# Patient Record
Sex: Female | Born: 1935
Health system: Southern US, Community
[De-identification: ages and names within clinical notes are randomized; demographics above are authoritative.]

## PROBLEM LIST (undated history)

## (undated) DIAGNOSIS — M1990 Unspecified osteoarthritis, unspecified site: Secondary | ICD-10-CM

## (undated) DIAGNOSIS — I10 Essential (primary) hypertension: Secondary | ICD-10-CM

## (undated) DIAGNOSIS — K631 Perforation of intestine (nontraumatic): Secondary | ICD-10-CM

## (undated) DIAGNOSIS — G894 Chronic pain syndrome: Secondary | ICD-10-CM

## (undated) DIAGNOSIS — I471 Supraventricular tachycardia, unspecified: Secondary | ICD-10-CM

## (undated) DIAGNOSIS — C801 Malignant (primary) neoplasm, unspecified: Secondary | ICD-10-CM

## (undated) DIAGNOSIS — N189 Chronic kidney disease, unspecified: Secondary | ICD-10-CM

## (undated) DIAGNOSIS — I6529 Occlusion and stenosis of unspecified carotid artery: Secondary | ICD-10-CM

## (undated) DIAGNOSIS — E785 Hyperlipidemia, unspecified: Secondary | ICD-10-CM

## (undated) DIAGNOSIS — F0390 Unspecified dementia without behavioral disturbance: Secondary | ICD-10-CM

## (undated) HISTORY — PX: ABDOMINAL HYSTERECTOMY: SHX81

## (undated) HISTORY — DX: Occlusion and stenosis of unspecified carotid artery: I65.29

## (undated) HISTORY — DX: Malignant (primary) neoplasm, unspecified: C80.1

## (undated) HISTORY — DX: Hyperlipidemia, unspecified: E78.5

## (undated) HISTORY — PX: COLON SURGERY: SHX602

## (undated) HISTORY — DX: Essential (primary) hypertension: I10

## (undated) HISTORY — DX: Perforation of intestine (nontraumatic): K63.1

## (undated) HISTORY — PX: COLOSTOMY: SHX63

---

## 2008-03-16 ENCOUNTER — Encounter: Admission: RE | Admit: 2008-03-16 | Discharge: 2008-03-16 | Payer: Self-pay

## 2008-03-25 ENCOUNTER — Ambulatory Visit: Payer: Self-pay | Admitting: Vascular Surgery

## 2008-04-05 ENCOUNTER — Ambulatory Visit: Payer: Self-pay | Admitting: Vascular Surgery

## 2008-04-05 ENCOUNTER — Encounter: Payer: Self-pay | Admitting: Vascular Surgery

## 2008-04-05 ENCOUNTER — Inpatient Hospital Stay (HOSPITAL_COMMUNITY): Admission: RE | Admit: 2008-04-05 | Discharge: 2008-04-06 | Payer: Self-pay | Admitting: Vascular Surgery

## 2008-04-05 HISTORY — PX: CAROTID ENDARTERECTOMY: SUR193

## 2008-04-22 ENCOUNTER — Ambulatory Visit: Payer: Self-pay | Admitting: Vascular Surgery

## 2008-10-13 IMAGING — CR DG CHEST 1V
1 series · 1 of 1 positions shown · non-contrast
Comparison: 03/30/2008

CLINICAL DATA: Preop for vascular surgery.  Routine preop chest x-
ray showed possible right lower lobe nodule versus nipple shadow

CHEST - 1 VIEW

[view not recorded]
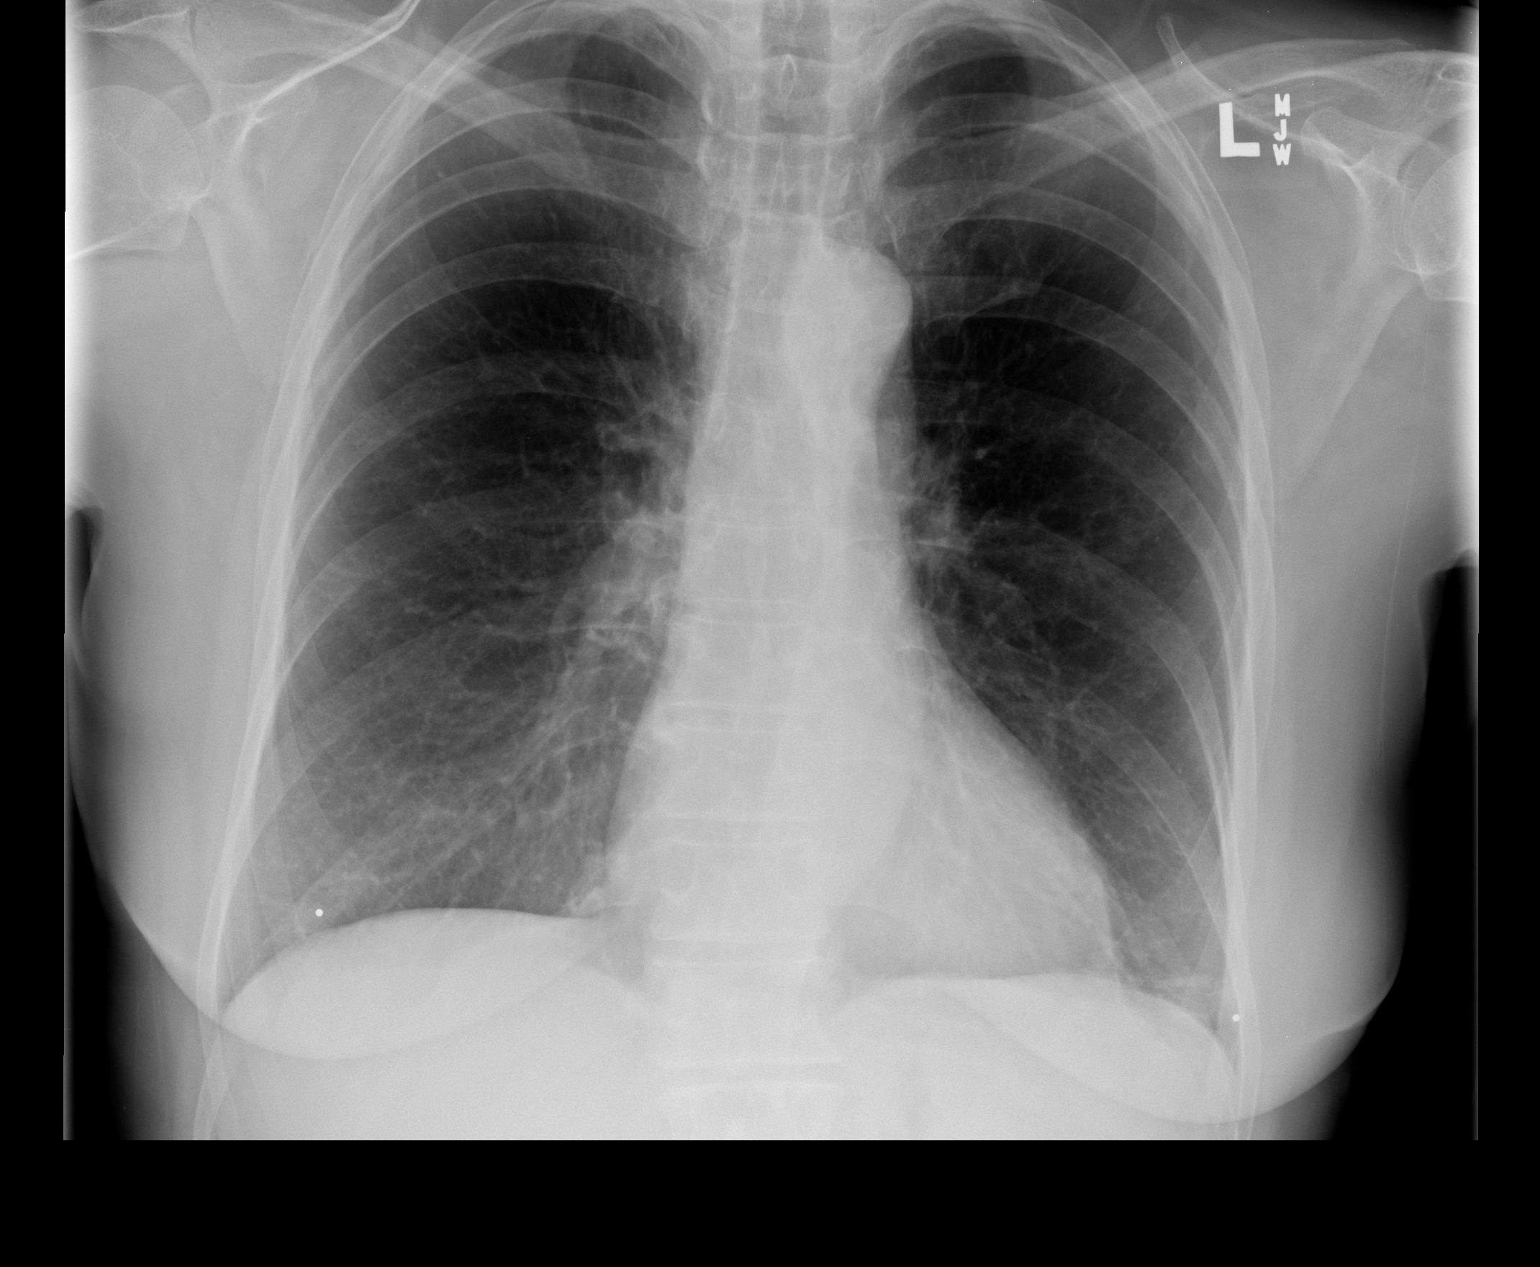

[1 of 1 positions shown; findings below may reference images not displayed]

FINDINGS: The right nipple accounts for the density at the right
lung base.  There is no acute process.  Lungs clear.  Heart and
vascularity normal.
IMPRESSION: No active disease - the right nipple accounts for the shadow on the
preoperative chest x-ray dated 03/30/2008.

## 2008-10-28 ENCOUNTER — Ambulatory Visit: Payer: Self-pay | Admitting: Vascular Surgery

## 2009-06-02 ENCOUNTER — Ambulatory Visit: Payer: Self-pay | Admitting: Vascular Surgery

## 2010-02-20 ENCOUNTER — Encounter: Admission: RE | Admit: 2010-02-20 | Discharge: 2010-02-20 | Payer: Self-pay | Admitting: Family Medicine

## 2010-12-09 DIAGNOSIS — K631 Perforation of intestine (nontraumatic): Secondary | ICD-10-CM

## 2010-12-09 HISTORY — DX: Perforation of intestine (nontraumatic): K63.1

## 2010-12-11 ENCOUNTER — Encounter
Admission: RE | Admit: 2010-12-11 | Discharge: 2010-12-11 | Payer: Self-pay | Source: Home / Self Care | Attending: Family Medicine | Admitting: Family Medicine

## 2010-12-11 ENCOUNTER — Inpatient Hospital Stay (HOSPITAL_COMMUNITY)
Admission: EM | Admit: 2010-12-11 | Discharge: 2010-12-21 | Payer: Self-pay | Source: Home / Self Care | Attending: Internal Medicine | Admitting: Internal Medicine

## 2010-12-12 LAB — COMPREHENSIVE METABOLIC PANEL
ALT: 12 U/L (ref 0–35)
AST: 13 U/L (ref 0–37)
Albumin: 1.7 g/dL — ABNORMAL LOW (ref 3.5–5.2)
Alkaline Phosphatase: 73 U/L (ref 39–117)
BUN: 18 mg/dL (ref 6–23)
CO2: 24 mEq/L (ref 19–32)
Calcium: 7.7 mg/dL — ABNORMAL LOW (ref 8.4–10.5)
Chloride: 103 mEq/L (ref 96–112)
Creatinine, Ser: 0.88 mg/dL (ref 0.4–1.2)
GFR calc Af Amer: >60 mL/min (ref >60)
GFR calc non Af Amer: >60 mL/min (ref >60)
Glucose, Bld: 95 mg/dL (ref 70–99)
Potassium: 3.8 mEq/L (ref 3.5–5.1)
Sodium: 135 mEq/L (ref 135–145)
Total Bilirubin: 0.5 mg/dL (ref 0.3–1.2)
Total Protein: 5.6 g/dL — ABNORMAL LOW (ref 6.0–8.3)

## 2010-12-12 LAB — PROTIME-INR
INR: 1.38 (ref 0.00–1.49)
Prothrombin Time: 17.2 seconds — ABNORMAL HIGH (ref 11.6–15.2)

## 2010-12-12 LAB — CBC
HCT: 27.7 % — ABNORMAL LOW (ref 36.0–46.0)
Hemoglobin: 8.8 g/dL — ABNORMAL LOW (ref 12.0–15.0)
MCH: 26.9 pg (ref 26.0–34.0)
MCHC: 31.8 g/dL (ref 30.0–36.0)
MCV: 84.7 fL (ref 78.0–100.0)
Platelets: 467 10*3/uL — ABNORMAL HIGH (ref 150–400)
RBC: 3.27 MIL/uL — ABNORMAL LOW (ref 3.87–5.11)
RDW: 14.4 % (ref 11.5–15.5)
WBC: 19.5 10*3/uL — ABNORMAL HIGH (ref 4.0–10.5)

## 2010-12-12 LAB — MAGNESIUM: Magnesium: 2.2 mg/dL (ref 1.5–2.5)

## 2010-12-12 LAB — APTT: aPTT: 54 seconds — ABNORMAL HIGH (ref 24–37)

## 2010-12-13 LAB — BASIC METABOLIC PANEL
BUN: 10 mg/dL (ref 6–23)
CO2: 25 mEq/L (ref 19–32)
Calcium: 7.6 mg/dL — ABNORMAL LOW (ref 8.4–10.5)
Chloride: 106 mEq/L (ref 96–112)
Creatinine, Ser: 0.8 mg/dL (ref 0.4–1.2)
GFR calc Af Amer: >60 mL/min (ref >60)
GFR calc non Af Amer: >60 mL/min (ref >60)
Glucose, Bld: 98 mg/dL (ref 70–99)
Potassium: 3.6 mEq/L (ref 3.5–5.1)
Sodium: 138 mEq/L (ref 135–145)

## 2010-12-13 LAB — CBC
HCT: 27.4 % — ABNORMAL LOW (ref 36.0–46.0)
Hemoglobin: 8.5 g/dL — ABNORMAL LOW (ref 12.0–15.0)
MCH: 26.4 pg (ref 26.0–34.0)
MCHC: 31 g/dL (ref 30.0–36.0)
MCV: 85.1 fL (ref 78.0–100.0)
Platelets: 480 10*3/uL — ABNORMAL HIGH (ref 150–400)
RBC: 3.22 MIL/uL — ABNORMAL LOW (ref 3.87–5.11)
RDW: 14.6 % (ref 11.5–15.5)
WBC: 13.8 10*3/uL — ABNORMAL HIGH (ref 4.0–10.5)

## 2010-12-13 LAB — HEMOCCULT GUIAC POC 1CARD (OFFICE): Fecal Occult Bld: POSITIVE

## 2010-12-14 ENCOUNTER — Encounter (INDEPENDENT_AMBULATORY_CARE_PROVIDER_SITE_OTHER): Payer: Self-pay | Admitting: Internal Medicine

## 2010-12-14 LAB — CBC
HCT: 28.2 % — ABNORMAL LOW (ref 36.0–46.0)
Hemoglobin: 8.7 g/dL — ABNORMAL LOW (ref 12.0–15.0)
MCH: 26 pg (ref 26.0–34.0)
MCHC: 30.9 g/dL (ref 30.0–36.0)
MCV: 84.4 fL (ref 78.0–100.0)
Platelets: 461 10*3/uL — ABNORMAL HIGH (ref 150–400)
RBC: 3.34 MIL/uL — ABNORMAL LOW (ref 3.87–5.11)
RDW: 14.5 % (ref 11.5–15.5)
WBC: 10.6 10*3/uL — ABNORMAL HIGH (ref 4.0–10.5)

## 2010-12-14 LAB — PREALBUMIN: Prealbumin: 4.2 mg/dL — ABNORMAL LOW (ref 17.0–34.0)

## 2010-12-14 LAB — CEA: CEA: 2.3 ng/mL (ref 0.0–5.0)

## 2010-12-24 LAB — BASIC METABOLIC PANEL
BUN: 3 mg/dL — ABNORMAL LOW (ref 6–23)
BUN: 2 mg/dL — ABNORMAL LOW (ref 6–23)
BUN: 2 mg/dL — ABNORMAL LOW (ref 6–23)
BUN: 1 mg/dL — ABNORMAL LOW (ref 6–23)
BUN: 4 mg/dL — ABNORMAL LOW (ref 6–23)
BUN: 6 mg/dL (ref 6–23)
CO2: 29 mEq/L (ref 19–32)
CO2: 26 mEq/L (ref 19–32)
CO2: 26 mEq/L (ref 19–32)
CO2: 29 mEq/L (ref 19–32)
CO2: 28 mEq/L (ref 19–32)
CO2: 28 mEq/L (ref 19–32)
Calcium: 7.6 mg/dL — ABNORMAL LOW (ref 8.4–10.5)
Calcium: 7.7 mg/dL — ABNORMAL LOW (ref 8.4–10.5)
Calcium: 7.5 mg/dL — ABNORMAL LOW (ref 8.4–10.5)
Calcium: 7.8 mg/dL — ABNORMAL LOW (ref 8.4–10.5)
Calcium: 8 mg/dL — ABNORMAL LOW (ref 8.4–10.5)
Calcium: 7.6 mg/dL — ABNORMAL LOW (ref 8.4–10.5)
Chloride: 101 mEq/L (ref 96–112)
Chloride: 106 mEq/L (ref 96–112)
Chloride: 109 mEq/L (ref 96–112)
Chloride: 107 mEq/L (ref 96–112)
Chloride: 103 mEq/L (ref 96–112)
Chloride: 103 mEq/L (ref 96–112)
Creatinine, Ser: 0.62 mg/dL (ref 0.4–1.2)
Creatinine, Ser: 0.73 mg/dL (ref 0.4–1.2)
Creatinine, Ser: 0.63 mg/dL (ref 0.4–1.2)
Creatinine, Ser: 0.63 mg/dL (ref 0.4–1.2)
Creatinine, Ser: 0.69 mg/dL (ref 0.4–1.2)
Creatinine, Ser: 0.63 mg/dL (ref 0.4–1.2)
GFR calc Af Amer: >60 mL/min (ref >60)
GFR calc Af Amer: >60 mL/min (ref >60)
GFR calc Af Amer: >60 mL/min (ref >60)
GFR calc Af Amer: >60 mL/min (ref >60)
GFR calc Af Amer: >60 mL/min (ref >60)
GFR calc Af Amer: >60 mL/min (ref >60)
GFR calc non Af Amer: >60 mL/min (ref >60)
GFR calc non Af Amer: >60 mL/min (ref >60)
GFR calc non Af Amer: >60 mL/min (ref >60)
GFR calc non Af Amer: >60 mL/min (ref >60)
GFR calc non Af Amer: >60 mL/min (ref >60)
GFR calc non Af Amer: >60 mL/min (ref >60)
Glucose, Bld: 149 mg/dL — ABNORMAL HIGH (ref 70–99)
Glucose, Bld: 136 mg/dL — ABNORMAL HIGH (ref 70–99)
Glucose, Bld: 144 mg/dL — ABNORMAL HIGH (ref 70–99)
Glucose, Bld: 106 mg/dL — ABNORMAL HIGH (ref 70–99)
Glucose, Bld: 108 mg/dL — ABNORMAL HIGH (ref 70–99)
Glucose, Bld: 106 mg/dL — ABNORMAL HIGH (ref 70–99)
Potassium: 2.8 mEq/L — ABNORMAL LOW (ref 3.5–5.1)
Potassium: 3.5 mEq/L (ref 3.5–5.1)
Potassium: 3.8 mEq/L (ref 3.5–5.1)
Potassium: 3.1 mEq/L — ABNORMAL LOW (ref 3.5–5.1)
Potassium: 3.6 mEq/L (ref 3.5–5.1)
Potassium: 3.4 mEq/L — ABNORMAL LOW (ref 3.5–5.1)
Sodium: 138 mEq/L (ref 135–145)
Sodium: 139 mEq/L (ref 135–145)
Sodium: 140 mEq/L (ref 135–145)
Sodium: 142 mEq/L (ref 135–145)
Sodium: 140 mEq/L (ref 135–145)
Sodium: 137 mEq/L (ref 135–145)

## 2010-12-24 LAB — CBC
HCT: 29.3 % — ABNORMAL LOW (ref 36.0–46.0)
HCT: 27.9 % — ABNORMAL LOW (ref 36.0–46.0)
HCT: 28.2 % — ABNORMAL LOW (ref 36.0–46.0)
HCT: 27.8 % — ABNORMAL LOW (ref 36.0–46.0)
Hemoglobin: 9.2 g/dL — ABNORMAL LOW (ref 12.0–15.0)
Hemoglobin: 8.6 g/dL — ABNORMAL LOW (ref 12.0–15.0)
Hemoglobin: 8.6 g/dL — ABNORMAL LOW (ref 12.0–15.0)
Hemoglobin: 8.5 g/dL — ABNORMAL LOW (ref 12.0–15.0)
MCH: 26.4 pg (ref 26.0–34.0)
MCH: 26.3 pg (ref 26.0–34.0)
MCH: 26.3 pg (ref 26.0–34.0)
MCH: 26.2 pg (ref 26.0–34.0)
MCHC: 31.4 g/dL (ref 30.0–36.0)
MCHC: 30.8 g/dL (ref 30.0–36.0)
MCHC: 30.5 g/dL (ref 30.0–36.0)
MCHC: 30.6 g/dL (ref 30.0–36.0)
MCV: 84.2 fL (ref 78.0–100.0)
MCV: 85.3 fL (ref 78.0–100.0)
MCV: 86.2 fL (ref 78.0–100.0)
MCV: 85.5 fL (ref 78.0–100.0)
Platelets: 466 10*3/uL — ABNORMAL HIGH (ref 150–400)
Platelets: 432 10*3/uL — ABNORMAL HIGH (ref 150–400)
Platelets: 438 10*3/uL — ABNORMAL HIGH (ref 150–400)
Platelets: 460 10*3/uL — ABNORMAL HIGH (ref 150–400)
RBC: 3.48 MIL/uL — ABNORMAL LOW (ref 3.87–5.11)
RBC: 3.27 MIL/uL — ABNORMAL LOW (ref 3.87–5.11)
RBC: 3.27 MIL/uL — ABNORMAL LOW (ref 3.87–5.11)
RBC: 3.25 MIL/uL — ABNORMAL LOW (ref 3.87–5.11)
RDW: 14.4 % (ref 11.5–15.5)
RDW: 14.8 % (ref 11.5–15.5)
RDW: 15 % (ref 11.5–15.5)
RDW: 15.4 % (ref 11.5–15.5)
WBC: 10.7 10*3/uL — ABNORMAL HIGH (ref 4.0–10.5)
WBC: 11.8 10*3/uL — ABNORMAL HIGH (ref 4.0–10.5)
WBC: 9.8 10*3/uL (ref 4.0–10.5)
WBC: 8.4 10*3/uL (ref 4.0–10.5)

## 2010-12-24 LAB — CULTURE, ROUTINE-ABSCESS

## 2010-12-24 LAB — ANAEROBIC CULTURE

## 2010-12-24 LAB — HEMOCCULT GUIAC POC 1CARD (OFFICE)
Fecal Occult Bld: POSITIVE
Fecal Occult Bld: POSITIVE

## 2010-12-24 LAB — PHOSPHORUS: Phosphorus: 3 mg/dL (ref 2.3–4.6)

## 2010-12-24 LAB — CLOSTRIDIUM DIFFICILE BY PCR
Toxigenic C. Difficile by PCR: NEGATIVE
Toxigenic C. Difficile by PCR: NEGATIVE

## 2010-12-24 LAB — CA 125: CA 125: 28.2 U/mL (ref 0.0–30.2)

## 2010-12-24 LAB — TYPE AND SCREEN
ABO/RH(D): O POS
Antibody Screen: NEGATIVE

## 2010-12-24 LAB — MAGNESIUM: Magnesium: 1.6 mg/dL (ref 1.5–2.5)

## 2010-12-24 LAB — ABO/RH: ABO/RH(D): O POS

## 2010-12-27 ENCOUNTER — Emergency Department (HOSPITAL_BASED_OUTPATIENT_CLINIC_OR_DEPARTMENT_OTHER)
Admission: EM | Admit: 2010-12-27 | Discharge: 2010-12-27 | Payer: Self-pay | Source: Home / Self Care | Admitting: Emergency Medicine

## 2010-12-30 ENCOUNTER — Encounter: Payer: Self-pay | Admitting: Family Medicine

## 2010-12-31 LAB — DIFFERENTIAL
Basophils Absolute: 0 10*3/uL (ref 0.0–0.1)
Basophils Relative: 0 % (ref 0–1)
Eosinophils Absolute: 0.2 10*3/uL (ref 0.0–0.7)
Eosinophils Relative: 2 % (ref 0–5)
Lymphocytes Relative: 12 % (ref 12–46)
Lymphs Abs: 0.9 10*3/uL (ref 0.7–4.0)
Monocytes Absolute: 0.7 10*3/uL (ref 0.1–1.0)
Monocytes Relative: 9 % (ref 3–12)
Neutro Abs: 5.6 10*3/uL (ref 1.7–7.7)
Neutrophils Relative %: 76 % (ref 43–77)

## 2010-12-31 LAB — URINE MICROSCOPIC-ADD ON

## 2010-12-31 LAB — URINE CULTURE
Colony Count: NO GROWTH
Culture  Setup Time: 201201200140
Culture: NO GROWTH

## 2010-12-31 LAB — URINALYSIS, ROUTINE W REFLEX MICROSCOPIC
Bilirubin Urine: NEGATIVE
Ketones, ur: NEGATIVE mg/dL
Nitrite: NEGATIVE
Protein, ur: NEGATIVE mg/dL
Specific Gravity, Urine: 1.01 (ref 1.005–1.030)
Urine Glucose, Fasting: NEGATIVE mg/dL
Urobilinogen, UA: 0.2 mg/dL (ref 0.0–1.0)
pH: 6 (ref 5.0–8.0)

## 2010-12-31 LAB — CBC
HCT: 30.7 % — ABNORMAL LOW (ref 36.0–46.0)
Hemoglobin: 9.6 g/dL — ABNORMAL LOW (ref 12.0–15.0)
MCH: 26.2 pg (ref 26.0–34.0)
MCHC: 31.3 g/dL (ref 30.0–36.0)
MCV: 83.7 fL (ref 78.0–100.0)
Platelets: 425 10*3/uL — ABNORMAL HIGH (ref 150–400)
RBC: 3.67 MIL/uL — ABNORMAL LOW (ref 3.87–5.11)
RDW: 16.2 % — ABNORMAL HIGH (ref 11.5–15.5)
WBC: 7.3 10*3/uL (ref 4.0–10.5)

## 2010-12-31 LAB — COMPREHENSIVE METABOLIC PANEL
ALT: 20 U/L (ref 0–35)
AST: 19 U/L (ref 0–37)
Albumin: 3.1 g/dL — ABNORMAL LOW (ref 3.5–5.2)
Alkaline Phosphatase: 94 U/L (ref 39–117)
BUN: 12 mg/dL (ref 6–23)
CO2: 29 mEq/L (ref 19–32)
Calcium: 8.3 mg/dL — ABNORMAL LOW (ref 8.4–10.5)
Chloride: 106 mEq/L (ref 96–112)
Creatinine, Ser: 0.9 mg/dL (ref 0.4–1.2)
GFR calc Af Amer: >60 mL/min (ref >60)
GFR calc non Af Amer: >60 mL/min (ref >60)
Glucose, Bld: 107 mg/dL — ABNORMAL HIGH (ref 70–99)
Potassium: 3.7 mEq/L (ref 3.5–5.1)
Sodium: 143 mEq/L (ref 135–145)
Total Bilirubin: 0.4 mg/dL (ref 0.3–1.2)
Total Protein: 6.7 g/dL (ref 6.0–8.3)

## 2010-12-31 LAB — PROCALCITONIN: Procalcitonin: <0.1 ng/mL

## 2010-12-31 LAB — LACTIC ACID, PLASMA: Lactic Acid, Venous: 1 mmol/L (ref 0.5–2.2)

## 2011-01-02 LAB — CULTURE, BLOOD (ROUTINE X 2)
Culture  Setup Time: 201201192318
Culture: NO GROWTH

## 2011-01-03 LAB — CULTURE, BLOOD (ROUTINE X 2)
Culture  Setup Time: 201201200151
Culture: NO GROWTH

## 2011-02-18 LAB — URINALYSIS, ROUTINE W REFLEX MICROSCOPIC
Bilirubin Urine: NEGATIVE
Glucose, UA: NEGATIVE mg/dL
Ketones, ur: NEGATIVE mg/dL
Nitrite: POSITIVE — AB
Protein, ur: NEGATIVE mg/dL
Specific Gravity, Urine: >1.046 — ABNORMAL HIGH (ref 1.005–1.030)
Urobilinogen, UA: 0.2 mg/dL (ref 0.0–1.0)
pH: 6 (ref 5.0–8.0)

## 2011-02-18 LAB — CBC
HCT: 37.5 % (ref 36.0–46.0)
Hemoglobin: 12.1 g/dL (ref 12.0–15.0)
MCH: 27.3 pg (ref 26.0–34.0)
MCHC: 32.3 g/dL (ref 30.0–36.0)
MCV: 84.7 fL (ref 78.0–100.0)
Platelets: 427 10*3/uL — ABNORMAL HIGH (ref 150–400)
RBC: 4.43 MIL/uL (ref 3.87–5.11)
RDW: 14.7 % (ref 11.5–15.5)
WBC: 24.4 10*3/uL — ABNORMAL HIGH (ref 4.0–10.5)

## 2011-02-18 LAB — BASIC METABOLIC PANEL
BUN: 25 mg/dL — ABNORMAL HIGH (ref 6–23)
CO2: 26 mEq/L (ref 19–32)
Calcium: 8.3 mg/dL — ABNORMAL LOW (ref 8.4–10.5)
Chloride: 98 mEq/L (ref 96–112)
Creatinine, Ser: 1 mg/dL (ref 0.4–1.2)
GFR calc Af Amer: >60 mL/min (ref >60)
GFR calc non Af Amer: 54 mL/min — ABNORMAL LOW (ref >60)
Glucose, Bld: 96 mg/dL (ref 70–99)
Potassium: 5.1 mEq/L (ref 3.5–5.1)
Sodium: 134 mEq/L — ABNORMAL LOW (ref 135–145)

## 2011-02-18 LAB — URINE CULTURE
Colony Count: >=100000
Culture  Setup Time: 201201040215

## 2011-02-18 LAB — APTT: aPTT: 49 seconds — ABNORMAL HIGH (ref 24–37)

## 2011-02-18 LAB — DIFFERENTIAL
Basophils Absolute: 0 10*3/uL (ref 0.0–0.1)
Basophils Relative: 0 % (ref 0–1)
Eosinophils Absolute: 0 10*3/uL (ref 0.0–0.7)
Eosinophils Relative: 0 % (ref 0–5)
Lymphocytes Relative: 6 % — ABNORMAL LOW (ref 12–46)
Lymphs Abs: 1.4 10*3/uL (ref 0.7–4.0)
Monocytes Absolute: 0.6 10*3/uL (ref 0.1–1.0)
Monocytes Relative: 3 % (ref 3–12)
Neutro Abs: 22.3 10*3/uL — ABNORMAL HIGH (ref 1.7–7.7)
Neutrophils Relative %: 92 % — ABNORMAL HIGH (ref 43–77)

## 2011-02-18 LAB — PROTIME-INR
INR: 1.35 (ref 0.00–1.49)
Prothrombin Time: 16.9 seconds — ABNORMAL HIGH (ref 11.6–15.2)

## 2011-02-18 LAB — URINE MICROSCOPIC-ADD ON

## 2011-03-07 ENCOUNTER — Other Ambulatory Visit: Payer: Self-pay | Admitting: Gastroenterology

## 2011-03-13 ENCOUNTER — Other Ambulatory Visit: Payer: Self-pay | Admitting: General Surgery

## 2011-03-13 DIAGNOSIS — C189 Malignant neoplasm of colon, unspecified: Secondary | ICD-10-CM

## 2011-03-18 ENCOUNTER — Ambulatory Visit
Admission: RE | Admit: 2011-03-18 | Discharge: 2011-03-18 | Disposition: A | Discharge status: Home / Self Care | Payer: Medicare Other | Source: Ambulatory Visit | Attending: General Surgery | Admitting: General Surgery

## 2011-03-18 DIAGNOSIS — C189 Malignant neoplasm of colon, unspecified: Secondary | ICD-10-CM

## 2011-03-18 MED ORDER — IOHEXOL 300 MG/ML  SOLN
100.0000 mL | Freq: Once | INTRAMUSCULAR | Status: AC | PRN
  Administered 2011-03-18: 100 mL via INTRAVENOUS

## 2011-03-28 ENCOUNTER — Other Ambulatory Visit: Payer: Self-pay | Admitting: Surgery

## 2011-03-28 ENCOUNTER — Encounter (HOSPITAL_COMMUNITY): Payer: Medicare Other | Attending: Surgery

## 2011-03-28 DIAGNOSIS — C182 Malignant neoplasm of ascending colon: Secondary | ICD-10-CM | POA: Insufficient documentation

## 2011-03-28 DIAGNOSIS — Z01812 Encounter for preprocedural laboratory examination: Secondary | ICD-10-CM | POA: Insufficient documentation

## 2011-03-28 DIAGNOSIS — I10 Essential (primary) hypertension: Secondary | ICD-10-CM | POA: Insufficient documentation

## 2011-03-28 DIAGNOSIS — E7800 Pure hypercholesterolemia, unspecified: Secondary | ICD-10-CM | POA: Insufficient documentation

## 2011-03-28 LAB — SURGICAL PCR SCREEN
MRSA, PCR: NEGATIVE
Staphylococcus aureus: NEGATIVE

## 2011-03-28 LAB — CBC
MCH: 25.5 pg — ABNORMAL LOW (ref 26.0–34.0)
Platelets: 317 10*3/uL (ref 150–400)
RBC: 4.04 MIL/uL (ref 3.87–5.11)
WBC: 9 10*3/uL (ref 4.0–10.5)

## 2011-03-28 LAB — BASIC METABOLIC PANEL
Chloride: 104 mEq/L (ref 96–112)
Creatinine, Ser: 0.72 mg/dL (ref 0.4–1.2)
GFR calc Af Amer: >60 mL/min (ref >60)

## 2011-04-02 ENCOUNTER — Inpatient Hospital Stay (HOSPITAL_COMMUNITY)
Admission: RE | Admit: 2011-04-02 | Discharge: 2011-04-10 | DRG: 330 | Disposition: A | Discharge status: Home / Self Care | Payer: Medicare Other | Source: Ambulatory Visit | Attending: Surgery | Admitting: Surgery

## 2011-04-02 ENCOUNTER — Other Ambulatory Visit: Payer: Self-pay | Admitting: Urology

## 2011-04-02 ENCOUNTER — Inpatient Hospital Stay (HOSPITAL_COMMUNITY): Payer: Medicare Other

## 2011-04-02 DIAGNOSIS — I10 Essential (primary) hypertension: Secondary | ICD-10-CM | POA: Diagnosis present

## 2011-04-02 DIAGNOSIS — K7689 Other specified diseases of liver: Secondary | ICD-10-CM | POA: Diagnosis present

## 2011-04-02 DIAGNOSIS — E7800 Pure hypercholesterolemia, unspecified: Secondary | ICD-10-CM | POA: Diagnosis present

## 2011-04-02 DIAGNOSIS — C50919 Malignant neoplasm of unspecified site of unspecified female breast: Secondary | ICD-10-CM | POA: Diagnosis present

## 2011-04-02 DIAGNOSIS — C187 Malignant neoplasm of sigmoid colon: Principal | ICD-10-CM | POA: Diagnosis present

## 2011-04-02 DIAGNOSIS — C779 Secondary and unspecified malignant neoplasm of lymph node, unspecified: Secondary | ICD-10-CM | POA: Diagnosis present

## 2011-04-02 DIAGNOSIS — N9489 Other specified conditions associated with female genital organs and menstrual cycle: Secondary | ICD-10-CM | POA: Diagnosis present

## 2011-04-02 DIAGNOSIS — K660 Peritoneal adhesions (postprocedural) (postinfection): Secondary | ICD-10-CM | POA: Diagnosis present

## 2011-04-02 DIAGNOSIS — N321 Vesicointestinal fistula: Secondary | ICD-10-CM | POA: Diagnosis present

## 2011-04-02 DIAGNOSIS — N3289 Other specified disorders of bladder: Secondary | ICD-10-CM | POA: Diagnosis present

## 2011-04-02 DIAGNOSIS — K560 Paralytic ileus: Secondary | ICD-10-CM | POA: Diagnosis not present

## 2011-04-02 LAB — PREPARE RBC (CROSSMATCH)

## 2011-04-02 LAB — CBC
MCHC: 31 g/dL (ref 30.0–36.0)
Platelets: 286 10*3/uL (ref 150–400)
RDW: 13.7 % (ref 11.5–15.5)
WBC: 16.4 10*3/uL — ABNORMAL HIGH (ref 4.0–10.5)

## 2011-04-02 LAB — BASIC METABOLIC PANEL
Calcium: 7.6 mg/dL — ABNORMAL LOW (ref 8.4–10.5)
GFR calc Af Amer: >60 mL/min (ref >60)
GFR calc non Af Amer: >60 mL/min (ref >60)
Glucose, Bld: 126 mg/dL — ABNORMAL HIGH (ref 70–99)
Potassium: 3.8 mEq/L (ref 3.5–5.1)
Sodium: 136 mEq/L (ref 135–145)

## 2011-04-03 LAB — CBC
HCT: 27.9 % — ABNORMAL LOW (ref 36.0–46.0)
Hemoglobin: 8.9 g/dL — ABNORMAL LOW (ref 12.0–15.0)
MCH: 25.9 pg — ABNORMAL LOW (ref 26.0–34.0)
MCHC: 31.9 g/dL (ref 30.0–36.0)
MCV: 81.3 fL (ref 78.0–100.0)
Platelets: 264 K/uL (ref 150–400)
RBC: 3.43 MIL/uL — ABNORMAL LOW (ref 3.87–5.11)
RDW: 13.6 % (ref 11.5–15.5)
WBC: 14.5 K/uL — ABNORMAL HIGH (ref 4.0–10.5)

## 2011-04-03 LAB — DIFFERENTIAL
Basophils Absolute: 0 10*3/uL (ref 0.0–0.1)
Eosinophils Relative: 0 % (ref 0–5)
Lymphocytes Relative: 4 % — ABNORMAL LOW (ref 12–46)
Neutro Abs: 13.1 10*3/uL — ABNORMAL HIGH (ref 1.7–7.7)
Neutrophils Relative %: 90 % — ABNORMAL HIGH (ref 43–77)

## 2011-04-03 LAB — BASIC METABOLIC PANEL
BUN: 19 mg/dL (ref 6–23)
Creatinine, Ser: 1.23 mg/dL — ABNORMAL HIGH (ref 0.4–1.2)
GFR calc non Af Amer: 40 mL/min — ABNORMAL LOW (ref >60)
Glucose, Bld: 177 mg/dL — ABNORMAL HIGH (ref 70–99)
Potassium: 4 mEq/L (ref 3.5–5.1)

## 2011-04-04 LAB — DIFFERENTIAL
Basophils Absolute: 0 10*3/uL (ref 0.0–0.1)
Eosinophils Absolute: 0 10*3/uL (ref 0.0–0.7)
Eosinophils Relative: 0 % (ref 0–5)
Lymphocytes Relative: 13 % (ref 12–46)
Monocytes Absolute: 1 10*3/uL (ref 0.1–1.0)

## 2011-04-04 LAB — CBC
HCT: 27.9 % — ABNORMAL LOW (ref 36.0–46.0)
MCHC: 30.8 g/dL (ref 30.0–36.0)
MCV: 82.1 fL (ref 78.0–100.0)
RDW: 14 % (ref 11.5–15.5)

## 2011-04-04 LAB — BASIC METABOLIC PANEL
BUN: 13 mg/dL (ref 6–23)
Calcium: 7.7 mg/dL — ABNORMAL LOW (ref 8.4–10.5)
GFR calc non Af Amer: 54 mL/min — ABNORMAL LOW (ref >60)
Glucose, Bld: 120 mg/dL — ABNORMAL HIGH (ref 70–99)

## 2011-04-04 NOTE — Op Note (Signed)
  NAMERADLEY, TESTON           ACCOUNT NO.:  [id number]  MEDICAL RECORD NO.:  [id number]           PATIENT TYPE:  I  LOCATION:  0001                         FACILITY:  [facility]  PHYSICIAN:  Courtney Paris, M.D.DATE OF BIRTH:  1932-07-07  DATE OF PROCEDURE:  04/02/2011 DATE OF DISCHARGE:                              OPERATIVE REPORT   PREOPERATIVE DIAGNOSIS:  Colon cancer.  POSTOPERATIVE DIAGNOSES: 1. Colon cancer. 2. Inflammatory lesion in midline posterior bladder base.  PROCEDURE:  Cystoscopy, bilateral retrogrades and placement of ureteral stents and biopsy of bladder lesion posterior midline with fulguration (1.5 cm).  SURGEON:  Courtney Paris, M.D.  ANESTHESIA:  General.  BRIEF HISTORY:  This 77 year old lady had a recent pelvic abscess, but was found to have a colon cancer and was going in for partial colon resection and Dr. Ovidio Kin wanted bilateral ureteral stents because of the previous pelvic abscess to help identify the ureters.  DESCRIPTION OF PROCEDURE:  The patient was placed on the operating table in dorsal lithotomy position.  After satisfactory induction of general anesthesia, was prepped and draped with Betadine in the usual sterile fashion.  Given IV antibiotics.  Time-out was then performed and the patient and procedure were then reconfirmed.  The 21 panendoscope was inserted and the bladder inspected.  There was a fair amount of edema in the posterior midline base of the bladder and there was a lesion that was about 1.5 cm that looked like a small tumor with possibly some purulent fluid coming from this.  This was photographed and then biopsied with the cold cup biopsy forceps.  This was done x2.  The base was then fulgurated to effect good hemostasis.  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  It took quite a bit of looking to find the  ureteral orifice, first found the right using indigo carmine given by the anesthesia personnel.  I was able to pass a catheter up the right orifice and then also finally on the left.  These were tied and taped over a Goldberg ureteral adapter and attached to a Foley catheter.  These were taped securely in place and labeled clearly left and right.  The stents could be removed either both after the surgery or one or both could be left as necessary postoperatively.     Courtney Paris, M.D.     HMK/MEDQ  D:  04/02/2011  T:  04/02/2011  Job:  [id number]  cc:   Sandria Bales. Ezzard Standing, M.D. [address], [address] [place] [address]  Electronically Signed by Vic Blackbird M.D. on 04/04/2011 07:50:15 AM

## 2011-04-06 LAB — TYPE AND SCREEN: Unit division: 0

## 2011-04-07 LAB — BASIC METABOLIC PANEL
Calcium: 8 mg/dL — ABNORMAL LOW (ref 8.4–10.5)
GFR calc Af Amer: >60 mL/min (ref >60)
GFR calc non Af Amer: >60 mL/min (ref >60)
Sodium: 132 mEq/L — ABNORMAL LOW (ref 135–145)

## 2011-04-07 LAB — CBC
MCHC: 31.5 g/dL (ref 30.0–36.0)
Platelets: 303 10*3/uL (ref 150–400)
RDW: 13.5 % (ref 11.5–15.5)

## 2011-04-15 NOTE — Op Note (Signed)
[name], [name]           ACCOUNT NO.:  [id number]  MEDICAL RECORD NO.:  [id number]           PATIENT TYPE:  I  LOCATION:  1239                         FACILITY:  [facility]  PHYSICIAN:  Sandria Bales. Ezzard Standing, M.D.  DATE OF BIRTH:  Jul 23, 1932  DATE OF PROCEDURE: 02 April 2011                              OPERATIVE REPORT   PREOPERATIVE DIAGNOSIS:  Distal sigmoid colon carcinoma with history of sigmoid colon abscess.  POSTOPERATIVE DIAGNOSIS:  Distal sigmoid/proximal rectal colon carcinoma with inflammatory change of abscess of sigmoid colon, uterus, and posterior bladder.  PROCEDURES: 1. Cystoscopy with bilateral ureteral stents placed and biopsy of bladder by Dr. Vic Blackbird. 2. Rigid sigmoidoscopy, laparoscopic mobilization of splenic flexure,     sigmoid colectomy, uterine biopsy, repair of cystotomy, end colostomy with Hartmann pouch by Dr. Ovidio Kin. 3. Then hysterectomy with left salpingo-oophorectomy by Dr. Jaymes Graff.  FIRST ASSISTANTS:  Adolph Pollack, M.D. and Wilmon Arms. Tsuei, M.D.  ANESTHESIA:  General.  ESTIMATED BLOOD LOSS:  200 cc.  DRAINS:  Drains left in were none.  I did leave Telfa wicks in the wound.  INDICATIONS FOR PROCEDURE:  [name] is a 77 year old white female who is a patient of Dr. Mila Palmer as her primary medical doctor who was hospitalized in January for what was thought to be a diverticular abscess.  This was managed with a percutaneous drain on December 13, 2010; however, a fustulagram was done at the drain which showed this communicated with the distal small bowel.  Because of this, the drain was left in for 6 plus weeks and then removed.  She underwent a screening colonoscopy by Dr. Vida Rigger on March 07, 2011, and was found to have a tumor at the distal sigmoid colon which was near circumferential and biopsy showed a mildly differentiated adenocarcinoma.  It is unclear about how much of this perforation was  diverticular in nature, how much was this due to colon cancer, and she now comes for attempted colonic resection.  Because  the amount of inflammation, I have asked Dr. Londell Moh Kimbrough to put ureteral stents in her.  I have discussed with the patient the indications, potential complications of surgery.  Potential complications include, but are not limited to, bleeding, infection, ureteral injury, the possibility she would need a colostomy.  OPERATIVE NOTE:  First the patient underwent a bilateral ureteral stent placement with cystoscopy by Dr. Vic Blackbird.  He also found at the dome of the bladder posteriorly inflammatory changes, which looked like purulence coming from an early bladder fistula.  After he completed his procedure, [name] was placed in the lithotomy position.  She had a Foley catheter in place with bilateral ureteral stents.  I did a sigmoidoscopy on her.  I was able to advance the scope up to about 12 to 14 cm before I ran in the sigmoid colon cancer.  The abdomen was then prepped with ChloraPrep and sterilely draped.  A time-out was held and surgical checklist run.  She was given 1 g of cefoxitin at the initiation of procedure.  She was redosed during the case after 4  hours.  I started by making an infraumbilical incision with sharp dissection carried down to the abdominal cavity.  I placed a 12 mm Hasson trocar, secured this with 0 Vicryl suture.  I then placed 3 different 5 mm trocars, one in the right upper quadrant, one in right lower quadrant, and one in left lower quadrant.  I spent about an hour and a half doing several things laparoscopically.    First, she had a lot of adhesions of the small bowel to the anterior abdominal wall and some omental adhesions.  I took these down both with scissors and Harmonic scalpel.  Secondly, I mobilized the splenic flexure going to the mid-transverse colon, taking the splenic flexure down along the left  colon so that the colon to be rolled medially and mobilized enough to get it down for the dissection thoroughly, I got down to the pelvis.  Thirdly, I tried to mobilize the sigmoid colon off the lower anterior pelvic wall, but because of dense adhesions I was really unable to do so.  So after I completed the laparoscopic portion of the operation, I then did an open laparotomy using the wound protector.  I was able to resect and get down in the pelvis.  First, I inspected this prior injury to the small bowel.  There were some inflammatory changes. I took this small bowel down, but there was no obvious transmural injury to the small bowel.  I took down some other adhesions along the small bowel also.  I started to work on the inflammatory mass.  The mass was stuck anteriorly to what looked like the bladder and the uterus. When I fractured through the inflammatory mass, it looked like I had fractured into the uterus and getting the specimen out.  It looked like this was the center of the inflammatory mass between the sigmoid colon, which was distal sigmoid colon.  The uterus was in the middle and the bladder anteriorly.I did find purulence within this cavity, which makes this wound class 4 but I was not able obtain any cultures of this.  A question during the operation was how much of this is malignant, how much was it benign.  I did a biopsy of the uterus and sent this to [facility].  She called back and said the uterine biopsy showed inflammatory changes but no obvious malignant cells.  It appeared, however, that this whole inflammatory mass centered almost the sigmoid colon but it involved the uterus and the posterior wall of the bladder.  I had to do a hysterectomy on her as an en bloc resection as best I could such that may involve malignancy.  Dr. Jaymes Graff was contacted on phone and she was contacted to scrub in and perform the hysterectomy.  We found the left ovary and  fallopian tube, we really did not find anything on the right, should dictate this portion of the operation.  We never got to the cervix because of the amount of the inflammation, we tried to get the uterus off the bladder anteriorly, but we did get into the bladder making about a 2 cm hole in the bladder.  The uterus was then removed as was the left fallopian tube and ovary, could not find again any obvious right ovary.  I was able to palpate the ureteral stents during this entire time, but I was able to get down below the tumor, so this was sort of a low anterior resection getting below the peritoneal folds.  I got where I thought, it was at least 5 or 6 cm beyond where this tumor and inflammatory mass was.  I used a firing of the Contour stapler with blue load and fired this across the specimen.  I then tacked the corners of the cut to proximal rectum with 2-0 Prolene sutures.  I then took the specimen. Dr. Guerry Bruin was the pathologist on-call and reviewed the pathology with him and I showed him the colon specimen.  I had a long suture marking at the proximal end.    I showed Dr. Laureen Ochs the specimen of the uterus and showed him that there was a long suture on the right round ligament and showed what I thought was the left fallopian tube and possible ovary.  He cut the specimen in the path department and it did look like I had good margins on both ends and even called middle of the case and this may be more inflammatory changes than pure cancer which could be favorable for the patient.  The length of our surgery at this time was probably some 5 plus hours between the cystoscopy and my surgery.   I was concerned how she would be best served by doing a diverting colostomy and at this hour she did well with the surgery, consider bringing her back for reanastomosis at a later date.  I irrigated the abdomen with about 5 L of saline.  I did take her appendix out.  I took down the  mesentery of the appendix using Harmonic. I took the base of the appendix with 2-0 Vicryl suture and did a 2-0 silk suture to invert the stump of the appendix.  Small bowel again inspected.  There were some adhesions, which had been again taken down but the small bowel otherwise looked pretty good.  She did have adhesions over the dome of her liver, which invested her gallbladder, I left these alone.  Because I did not have any anastomosis, so I did not leave an NG tube in her.  I will put her in the intensive care unit overnight for observation. Sponge and needle count were correct at end of the case.  I brought out a colostomy in the left lower quadrant after cutting out a piece of skin and dividing the anterior and posterior rectus fascia.  I closed the midline incision with a 2-0 Vicryl suture for the peritoneum and #1 Novafil suture for the fascia.  I stapled the skin, placed Telfa wicks on the skin.  I matured the colostomy with 3-0 Vicryl sutures.  At the abdomen was closed, the colostomy matured, the sponge and needle count were correct and the patient tolerated the procedure well, was transferred to recovery room with plans to ICU tonight.   Sandria Bales. Ezzard Standing, M.D., FACS   DHN/MEDQ  D:  04/02/2011  T:  04/03/2011  Job:  [id number]  cc:   Emeterio Reeve, MD Fax: [phone]  Petra Kuba, M.D. Fax: [phone]  Courtney Paris, M.D. Fax: [phone]  Naima A. Normand Sloop, M.D. Fax: [phone]  Electronically Signed by Ovidio Kin M.D. on 04/15/2011 02:55:36 PM

## 2011-04-15 NOTE — Discharge Summary (Signed)
[name], [name]           ACCOUNT NO.:  [id number]  MEDICAL RECORD NO.:  [id number]           PATIENT TYPE:  I  LOCATION:  1526                         FACILITY:  [facility]  PHYSICIAN:  Sandria Bales. Ezzard Standing, M.D.  DATE OF BIRTH:  08-Jul-1932  DATE OF ADMISSION:  04/02/2011 DATE OF DISCHARGE:  04/10/2011                              DISCHARGE SUMMARY   DISCHARGE DIAGNOSES: 1. T4 N0 sigmoid colon carcinoma with focal perforation. 2. Diverticular abscess. 3. Cystotomy, discharged with Foley in place. 4. Hypertension. 5. Hypercholesterolemia. 6. Anemia, post op.  OPERATIONS PERFORMED:  The patient underwent a rigid sigmoidoscopy, sigmoid colectomy with end colostomy, Hartmann's pouch, appendectomy, repair of cystotomy by Dr. Ovidio Kin. 02 April 2011 The patient underwent a cystoscopy with bilateral ureteral stents and biopsy of posterior bladder by Dr. Vic Blackbird.  02 April 2011 The patient underwent a hysterectomy and left salpingo-oophorectomy by Dr. Jaymes Graff. 02 April 2011  HISTORY OF PRESENT ILLNESS:  [name] is a 78 year old white female who sees Dr. Mila Palmer as her primary medical doctor and Dr. Vida Rigger from a GI standpoint.  She was hospitalized at [facility] in January 2012 for a diverticular abscess. She was admitted on January 3rd and discharged on January 13th.  She was found to have what was felt to be a diverticular abscess, which was managed with a percutaneous drain on December 13, 2010, however, a sinus injection through this drain done on December 19, 2010 suggested it communicated with a distal small bowel.  For this reason, the abscess drain was left in for 6+ weeks and then removed on February 04, 2011.  She then underwent a screening colonoscopy by Dr. Vida Rigger who found a tumor in her distal sigmoid colon, which was circumferential and a biopsy of this tumor showed a mildly differentiated adenocarcinoma.  She had undergone a CT  scan, which showed no evidence of metastatic disease.  She had some well-circumcised lesions of her liver thought to be cysts.  Her CEA was 3.8.  She had no prior abdominal surgery or GI complaints.  Her comorbid problems include that she had been treated for hypertension and hypercholesterolemia, was actually otherwise in pretty good health.  She underwent a mechanical and antibiotic bowel prep at home on the day prior to admission. She then presented to [facility] for an abdominal exploration on 02 April 2011.  First Dr. Vic Blackbird did a cystoscopy for bilateral ureteral stent placement.  He also noticed a nodule in the dome of her bladder, which looked like  the beginnings of an early enterovesical fistula.  I then did a laparoscopy/laparotomy where I did a mobilization of splenic flexure, sigmoid colectomy, a colostomy with Hartmann's pouch and an appendectomy.  She had an inflammatory mass involved her  distal sigmoid colon, proximal rectum, her uterus and poster wall of the bladder.  I asked Dr. Jaymes Graff to come and do a hysterectomy with left salpingo-oophorectomy.  Postoperatively, she was placed in the intensive care unit for a couple days.  By the second postoperative day, her temperature is 98.1, her creatinine 1.0, her hemoglobin was 8.6, hematocrit 27, white blood  count of 12,000. I did leave her on cefoxitin for 1 week postop.  Because of her cystotomy, Dr. Aldean Ast requested the Foley be left in for 10 days, so it is still evident at the day of discharge, but she has clear urine coming out of it and we will arrange for home health care nurse to help get this out.  I have discussed her discharge with her daughters.  It does appear that [name] would be able to go home and manage on her own.  She has been seen by the ostomy nurses who have tried to help her in colostomy training.  Her final pathology, the biopsy of her bladder showed inflamed urothelium and  chronic inflammation, but no evidence of malignancy.  Her sigmoid colon showed an invasive mildly differentiated adenocarcinoma invading through the muscularis propria into the pericolonic fat and involving the serosa of the left fallopian tube and she has angiolymphatic invasion. (T4)  She had 40 lymph nodes, all were negative for cancer (N0) and her appendix was normal.  She will see Dr. Leonard Schwartz. [place] for oncology consultation.  So, she is now 8 days postop and the plan is to let her go home.  We will arrange home health care to help with colostomy training early and to help and removing the Foley on Friday, May 4.  She will resume her home medications, which I have as being vitamin tablets of calcium, Centrum, and vitamin D3.  She had been Vicodin to go home with for pain.   She can shower.   She is to see me back in 2-3 weeks.  I have made arrangements with Dr. Mancel Bale to see her from an oncology standpoint, to talk about further treatment involving her colon cancer.  DISCHARGE CONDITION:  good.   Sandria Bales. Ezzard Standing, M.D., FACS   DHN/MEDQ  D:  04/09/2011  T:  04/10/2011  Job:  [id number]  cc:   Emeterio Reeve, MD Fax: [phone]  Petra Kuba, M.D. Fax: [phone]  Courtney Paris, M.D. Fax: [phone]  Naima A. Normand Sloop, M.D. Fax: [phone]  Ladene Artist, M.D. Fax: [phone]  Electronically Signed by Ovidio Kin M.D. on 04/15/2011 03:51:44 PM

## 2011-04-23 ENCOUNTER — Encounter (HOSPITAL_BASED_OUTPATIENT_CLINIC_OR_DEPARTMENT_OTHER): Payer: Medicare Other | Admitting: Oncology

## 2011-04-23 ENCOUNTER — Encounter: Payer: Medicare Other | Admitting: Oncology

## 2011-04-23 DIAGNOSIS — C187 Malignant neoplasm of sigmoid colon: Secondary | ICD-10-CM

## 2011-04-23 NOTE — Op Note (Signed)
NAMEDEANETTE, TULLIUS           ACCOUNT NO.:  [id number]   MEDICAL RECORD NO.:  [id number]          PATIENT TYPE:  INP   LOCATION:  2550                         FACILITY:  [facility]   PHYSICIAN:  Larina Earthly, M.D.    DATE OF BIRTH:  05-20-1932   DATE OF PROCEDURE:  04/05/2008  DATE OF DISCHARGE:                               OPERATIVE REPORT   PREOPERATIVE DIAGNOSIS:  Severe asymptomatic right internal carotid  artery stenosis.   POSTOPERATIVE DIAGNOSIS:  Severe asymptomatic right internal carotid  artery stenosis.   PROCEDURES:  1. Right carotid endarterectomy.  2. Dacron patch angioplasty.   SURGEON:  Larina Earthly, MD   ASSISTANT:  Wilmon Arms, PA-C   ANESTHESIA:  General endotracheal.   COMPLICATIONS:  None.   DISPOSITION:  To recovery room, neurologically intact.   PROCEDURE IN DETAIL:  The patient was taken to the operating room and  placed in supine position.  The area of the right neck was prepped and  draped in the usual sterile fashion.  Incision was made in the anterior  sternocleidomastoid and carried down to the platysma with  electrocautery.  Sternocleidomastoid was flexed posteriorly and the  carotid sheath was opened.  The facial vein was ligated with 2-0 silk  ties and divided.  The common carotid artery was encircled with an  umbilical tape and Rumel tourniquet.  Dissection was carried onto the  bifurcation of this.  The thyroid artery was encircled with a 2-0 silk  Potts tie.  The external carotid was encircled with a blue vessel loop  and the internal carotid was encircled with an umbilical tape and Rumel  tourniquet.  The vagus and hypoglossal nerves were identified and  preserved.  The patient was given 7000 units of intravenous heparin.  After adequate circulation time, the internal, external, and common  carotid arteries were occluded.  The common carotid artery was opened  with an 11-blade, and extended longitudinally with Potts scissors  through the plaque onto the internal carotid artery.  A 10 shunt was  passed up the internal carotid, allowed to backbleed, then down the  common carotid, where it was secured with a Rumel tourniquet.  The  endarterectomy was carried out on the common carotid artery and the  plaque was divided proximally with Potts scissors.  Endarterectomy was  carried on to the bifurcation.  The external carotid was  endarterectomized with an eversion technique and the internal carotid  was endarterectomized in an open fashion.  The remaining atheromatous  debris was removed from the endarterectomy plane.  A Finesse, Dacron,  and the Hemashield patches were brought on to the field and was sewn as  a patch angioplasty with a running 6-0 Prolene suture.  Prior to  completion of the anastomosis, the shunt was removed and the usual  flushing maneuvers were undertaken.  The anastomosis was completed and  flow was restored first to the external then the internal carotid  arteries.  Excellent flow characteristics were noted with hand-held  Doppler in the internal and external carotid arteries.  The patient was  given 50 mg of Protamine to  reverse heparin.  The wound was irrigated  with saline, hemostasis with electrocautery.  The wound was closed with  3-0 Vicryl in the  subcutaneous with reapproximation of the carotid muscle layer with  carotid sheath.  Next, the platysma was closed with a running 3-0 Vicryl  suture, and following this, the skin was closed with a 4-0 subcuticular  Vicryl stitch.  Sterile dressing was applied, and the patient was taken  to the recovery room in stable condition.      Larina Earthly, M.D.  Electronically Signed     TFE/MEDQ  D:  04/05/2008  T:  04/06/2008  Job:  [id number]   cc:   Emeterio Reeve, MD

## 2011-04-23 NOTE — H&P (Signed)
HISTORY AND PHYSICAL EXAMINATION   March 25, 2008   Re:  [name], [name]                 DOB:  Aug 24, 1932   The date of admission to the hospital will be 04/05/08.   The patient presents today for evaluation of asymptomatic right carotid  bruit.  She is Berg relatively healthy 77 year old white female who was  seen by Dr. Clarisa Fling on 03/11/2008.  She was found to have Berg right  carotid bruit and underwent Berg carotid duplex evaluation in our office  today.  This did show Berg critical stenosis and I am seeing her for  further discussion.   PAST HISTORY:  Significant for hypertension, elevated cholesterol.  She  has never had any amaurosis fugax or transient ischemic attack.  She  denies any cardiac difficulties.   PAST SURGICAL HISTORY:  None prior.   FAMILY HISTORY:  Father died age 52 of cerebral aneurysm.  Mother died  at 10 of myocardial infarction.  She was diabetic.   SOCIAL HISTORY:  Does smoke half pack cigarettes per day.  She has  social alcohol consumption.   ALLERGIES:  No known drug allergies.   MEDICATIONS:  Lisinopril and simvastatin daily.   REVIEW OF SYSTEMS:  Negative for cardiac, pulmonary, GI or GU  difficulties.  Also negative from Berg neuro standpoint.   PHYSICAL EXAM:  Well-developed, well-nourished white female appearing  stated age of 67.  Blood pressure is 132/71 left arm, 116/72 right arm,  pulse 98, respirations 18.  Her carotid arteries do reveal Berg soft right  carotid bruit with no bruit in the left.  She is grossly intact  neurologically.  She has 2+ radial, 2+ femoral, 2+ posterior tibial  pulses bilaterally.  Heart is regular rate rhythm without murmur, chest  is clear bilateral.  Abdominal exam reveals no masses and no aneurysm  palpable.   VASCULAR LAB STUDIES:  She did undergo noninvasive vascular laboratory  study today and this revealed severe stenosis in her right internal  carotid artery with Berg greater than 80-99%  stenosis.  Her left carotid  system has no evidence of significant stenosis.  I discussed this at  length with the patient.  I explained that this does put her at an  increased risk of stroke or transient ischemic attack.  I have  recommended elective right carotid endarterectomy for reduction of  stroke risk.  I have discussed the complications including 1% to 2 %  risk of stroke with procedure.  Explained that she would be admitted on  the day of surgery and all likelihood be discharged home the following  day with expected recovery.  She understands and wishes to proceed with  surgery and we will schedule this for 04/05/2008 at [facility].   [name], M.D.  Electronically Signed   TFE/MEDQ  D:  03/25/2008  T:  03/28/2008  Job:  [id number]   cc:   Clarisa Fling

## 2011-04-23 NOTE — Discharge Summary (Signed)
[name], [name]           ACCOUNT NO.:  [id number]   MEDICAL RECORD NO.:  [id number]          PATIENT TYPE:  INP   LOCATION:  3306                         FACILITY:  [facility]   PHYSICIAN:  Wilmon Arms, PA    DATE OF BIRTH:  Oct 26, 1932   DATE OF ADMISSION:  04/05/2008  DATE OF DISCHARGE:  04/06/2008                               DISCHARGE SUMMARY   DISCHARGE DIAGNOSES:  1. Right carotid occlusive disease.  2. Hypertension.  3. Hyperlipidemia.   PROCEDURE PERFORMED:  Right carotid endarterectomy by Dr. Arbie Cookey, April 05, 2008.   COMPLICATIONS:  None.   DISCHARGE MEDICATIONS:  1. Lisinopril p.o. daily.  2. Simvastatin p.o. daily two at bedtime.  3. Extra Strength Tylenol 1-2 p.o. q.4 h. p.r.n.  4. Aspirin 81 mg p.o. daily.  5. Percocet 5/325 one p.o. q.4 h. p.r.n. pain, 20 were given.   DISPOSITION:  She is being discharged to home in stable condition with  her wound healing well.  She is given careful instructions regarding the  care of her wounds.  She is to clean her wounds with soap and warm  water.  She is to observe the wounds for drainage, swelling, increasing  pain, redness, fever greater than 101.2, neurological changes.  She was  given an appointment to see Dr. Arbie Cookey in 2 weeks.  The office will call  with the appointment.   BRIEF IDENTIFYING STATEMENTS:  For complete details, please refer the  typed history and physical.  Briefly, this very pleasant 77 year old  woman was referred to Dr. Arbie Cookey for evaluation of symptomatic right  coronary arterial occlusive disease.  Dr. Arbie Cookey evaluated her and found  her to be a suitable candidate for right carotid endarterectomy.  She  was informed of the risks and benefits of the procedure, and after  careful consideration, elected to proceed with surgery.   HOSPITAL COURSE:  Preoperative workup was completed as an outpatient.  She was brought in through same-day surgery and underwent the  aforementioned right carotid  endarterectomy.  For complete details,  please refer the typed  operative report.  The procedure was without complication.  She was  returned to the Postanesthesia Care Unit extubated.  Following  stabilization, she was transferred to a bed in a surgical step-down  unit.  She was observed overnight.  The following morning, she was found  to be stable and was discharged to home in stable condition.      Wilmon Arms, PA     KEL/MEDQ  D:  04/06/2008  T:  04/06/2008  Job:  [id number]   cc:   Larina Earthly, M.D.  Emeterio Reeve, MD

## 2011-04-23 NOTE — Procedures (Signed)
CAROTID DUPLEX EXAM   INDICATION:  Followup right carotid endarterectomy.   HISTORY:  Diabetes:  No.  Cardiac:  No.  Hypertension:  Yes.  Smoking:  Yes.  Previous Surgery:  Right carotid endarterectomy on 04/05/2008.  CV History:  Asymptomatic.  Amaurosis Fugax No, Paresthesias No, Hemiparesis No                                       RIGHT             LEFT  Brachial systolic pressure:         136               134  Brachial Doppler waveforms:         Normal            Normal  Vertebral direction of flow:        Antegrade         Antegrade  DUPLEX VELOCITIES (cm/sec)  CCA peak systolic                   106               93  ECA peak systolic                   91                70  ICA peak systolic                   77                89  ICA end diastolic                   26                26  PLAQUE MORPHOLOGY:                                    Heterogeneous  PLAQUE AMOUNT:                      None              Mild  PLAQUE LOCATION:                                      ICA/bifurcation   IMPRESSION:  1. Patent right carotid endarterectomy site with no evidence of right      ICA stenosis noted.  2. 1-39% stenosis of the left internal carotid artery.  3. No significant change noted on the left side when compared to the      previous exam of 03/25/2008 with significant improvement of the      right internal carotid artery Doppler velocities noted since the      endarterectomy.   ___________________________________________  Larina Earthly, M.D.   CH/MEDQ  D:  10/28/2008  T:  10/28/2008  Job:  [id number]

## 2011-04-23 NOTE — Procedures (Signed)
CAROTID DUPLEX EXAM   INDICATION:  Follow-up carotid artery disease.   HISTORY:  Diabetes:  No.  Cardiac:  No.  Hypertension:  Yes.  Smoking:  Yes.  Previous Surgery:  Right CEA 04/05/2008.  CV History:  No.  Amaurosis Fugax No, Paresthesias No, Hemiparesis No.                                       RIGHT             LEFT  Brachial systolic pressure:         150               148  Brachial Doppler waveforms:         WNL               WNL  Vertebral direction of flow:        Antegrade         Antegrade  DUPLEX VELOCITIES (cm/sec)  CCA peak systolic                   122               120  ECA peak systolic                   86                124  ICA peak systolic                   78                100  ICA end diastolic                   26                31  PLAQUE MORPHOLOGY:                  N/A               Heterogeneous  PLAQUE AMOUNT:                      N/A               Mild  PLAQUE LOCATION:                    N/A               ICA/bifurcation   IMPRESSION:  1. Right ICA shows no evidence of restenosis status post CEA.  2. Left ICA shows evidence of 1% to 39% stenosis.  3. No significant changes from previous study.   ___________________________________________  Larina Earthly, M.D.   AS/MEDQ  D:  06/02/2009  T:  06/02/2009  Job:  [id number]

## 2011-04-23 NOTE — Procedures (Signed)
CAROTID DUPLEX EXAM   INDICATION:  Right carotid bruit.   HISTORY:  Diabetes:  No.  Cardiac:  No.  Hypertension:  Yes.  Smoking:  Yes.  Previous Surgery:  No.  CV History:  Asymptomatic.  Amaurosis Fugax No, Paresthesias No, Hemiparesis No                                       RIGHT             LEFT  Brachial systolic pressure:         140               138  Brachial Doppler waveforms:         Triphasic         Triphasic  Vertebral direction of flow:        Antegrade         Antegrade  DUPLEX VELOCITIES (cm/sec)  CCA peak systolic                   86                108  ECA peak systolic                   141               86  ICA peak systolic                   313               96  ICA end diastolic                   132               33  PLAQUE MORPHOLOGY:                  Soft, homogenous  Soft  PLAQUE AMOUNT:                      Severe            Mild  PLAQUE LOCATION:                    Proximal ICA/CCA  ICA/CCA   IMPRESSION:  1. Right internal carotid artery shows evidence of 80-99% stenosis.  2. Left internal carotid artery shows evidence of 1-39% stenosis.      ___________________________________________  Larina Earthly, M.D.   AS/MEDQ  D:  03/25/2008  T:  03/25/2008  Job:  [id number]

## 2011-04-23 NOTE — Assessment & Plan Note (Signed)
OFFICE VISIT   [name], [name]  DOB:  Jun 18, 1932                                       04/22/2008  ZOXWR#:[id number]   The patient presents today for followup of her recent right carotid  endarterectomy and Dacron patch angioplasty for severe asymptomatic  internal carotid stenosis.  Date of her surgery was 04/05/2008.  She did  quite well in the hospital and was discharged to home on postoperative  day #1.  She continues to do well having no major difficulties.  She  reports minimal discomfort with her incision has had no neurologic  deficits.   PHYSICAL EXAM:  Her incision is well-healed.  She has no bruits  bilaterally.  She is hemodynamically stable and neurologically intact.  I am quite pleased with her initial result and plan to see her again in  6 months with repeat carotid duplex at that time.  She will notify us  immediately should she develop any wound issues or neurologic deficits.   Larina Earthly, M.D.  Electronically Signed   TFE/MEDQ  D:  04/22/2008  T:  04/25/2008  Job:  [id number]   cc:   Dr. Clarisa Fling

## 2011-04-29 ENCOUNTER — Other Ambulatory Visit: Payer: Self-pay | Admitting: Oncology

## 2011-04-29 ENCOUNTER — Encounter: Payer: Medicare Other | Admitting: Oncology

## 2011-04-29 LAB — CBC WITH DIFFERENTIAL/PLATELET
Basophils Absolute: 0 10*3/uL (ref 0.0–0.1)
EOS%: 0.9 % (ref 0.0–7.0)
Eosinophils Absolute: 0.1 10*3/uL (ref 0.0–0.5)
HGB: 11.3 g/dL — ABNORMAL LOW (ref 11.6–15.9)
MCH: 26.4 pg (ref 25.1–34.0)
NEUT#: 3.4 10*3/uL (ref 1.5–6.5)
RDW: 16 % — ABNORMAL HIGH (ref 11.2–14.5)
lymph#: 1.9 10*3/uL (ref 0.9–3.3)

## 2011-04-29 LAB — COMPREHENSIVE METABOLIC PANEL
AST: 13 U/L (ref 0–37)
Albumin: 4.4 g/dL (ref 3.5–5.2)
BUN: 16 mg/dL (ref 6–23)
Calcium: 9.4 mg/dL (ref 8.4–10.5)
Chloride: 101 mEq/L (ref 96–112)
Potassium: 4.1 mEq/L (ref 3.5–5.3)

## 2011-05-20 ENCOUNTER — Other Ambulatory Visit: Payer: Self-pay | Admitting: Oncology

## 2011-05-20 ENCOUNTER — Encounter (HOSPITAL_BASED_OUTPATIENT_CLINIC_OR_DEPARTMENT_OTHER): Payer: Medicare Other | Admitting: Oncology

## 2011-05-20 DIAGNOSIS — C187 Malignant neoplasm of sigmoid colon: Secondary | ICD-10-CM

## 2011-05-20 LAB — CBC WITH DIFFERENTIAL/PLATELET
BASO%: 0.5 % (ref 0.0–2.0)
Basophils Absolute: 0 10*3/uL (ref 0.0–0.1)
EOS%: 0.8 % (ref 0.0–7.0)
HGB: 10.9 g/dL — ABNORMAL LOW (ref 11.6–15.9)
MCH: 27.1 pg (ref 25.1–34.0)
MCHC: 33.1 g/dL (ref 31.5–36.0)
RBC: 4.03 10*6/uL (ref 3.70–5.45)
RDW: 19.1 % — ABNORMAL HIGH (ref 11.2–14.5)
lymph#: 1.8 10*3/uL (ref 0.9–3.3)

## 2011-06-06 ENCOUNTER — Other Ambulatory Visit: Payer: Self-pay | Admitting: Oncology

## 2011-06-06 ENCOUNTER — Encounter (HOSPITAL_BASED_OUTPATIENT_CLINIC_OR_DEPARTMENT_OTHER): Payer: Medicare Other | Admitting: Oncology

## 2011-06-06 DIAGNOSIS — C187 Malignant neoplasm of sigmoid colon: Secondary | ICD-10-CM

## 2011-06-06 LAB — CBC WITH DIFFERENTIAL/PLATELET
Basophils Absolute: 0 10*3/uL (ref 0.0–0.1)
Eosinophils Absolute: 0.1 10*3/uL (ref 0.0–0.5)
HGB: 11.5 g/dL — ABNORMAL LOW (ref 11.6–15.9)
NEUT#: 4.5 10*3/uL (ref 1.5–6.5)
RDW: 19.4 % — ABNORMAL HIGH (ref 11.2–14.5)
WBC: 7.1 10*3/uL (ref 3.9–10.3)
lymph#: 1.9 10*3/uL (ref 0.9–3.3)

## 2011-06-06 LAB — COMPREHENSIVE METABOLIC PANEL
AST: 13 U/L (ref 0–37)
Albumin: 4.2 g/dL (ref 3.5–5.2)
BUN: 15 mg/dL (ref 6–23)
Calcium: 9 mg/dL (ref 8.4–10.5)
Chloride: 103 mEq/L (ref 96–112)
Glucose, Bld: 89 mg/dL (ref 70–99)
Potassium: 4.1 mEq/L (ref 3.5–5.3)
Sodium: 139 mEq/L (ref 135–145)
Total Protein: 6.4 g/dL (ref 6.0–8.3)

## 2011-06-28 ENCOUNTER — Other Ambulatory Visit: Payer: Self-pay | Admitting: Oncology

## 2011-06-28 ENCOUNTER — Encounter (HOSPITAL_BASED_OUTPATIENT_CLINIC_OR_DEPARTMENT_OTHER): Payer: Medicare Other | Admitting: Oncology

## 2011-06-28 DIAGNOSIS — C187 Malignant neoplasm of sigmoid colon: Secondary | ICD-10-CM

## 2011-06-28 LAB — CBC WITH DIFFERENTIAL/PLATELET
Basophils Absolute: 0 10*3/uL (ref 0.0–0.1)
EOS%: 2.3 % (ref 0.0–7.0)
HGB: 11.8 g/dL (ref 11.6–15.9)
MCH: 28.7 pg (ref 25.1–34.0)
MONO%: 8.8 % (ref 0.0–14.0)
NEUT#: 3.5 10*3/uL (ref 1.5–6.5)
RBC: 4.11 10*6/uL (ref 3.70–5.45)
RDW: 24 % — ABNORMAL HIGH (ref 11.2–14.5)
lymph#: 2 10*3/uL (ref 0.9–3.3)

## 2011-06-28 LAB — COMPREHENSIVE METABOLIC PANEL
ALT: 9 U/L (ref 0–35)
AST: 15 U/L (ref 0–37)
Albumin: 4.2 g/dL (ref 3.5–5.2)
Alkaline Phosphatase: 56 U/L (ref 39–117)
BUN: 15 mg/dL (ref 6–23)
Calcium: 9.2 mg/dL (ref 8.4–10.5)
Chloride: 103 mEq/L (ref 96–112)
Potassium: 4.1 mEq/L (ref 3.5–5.3)
Sodium: 142 mEq/L (ref 135–145)
Total Protein: 6.6 g/dL (ref 6.0–8.3)

## 2011-07-15 ENCOUNTER — Other Ambulatory Visit: Payer: Self-pay | Admitting: Oncology

## 2011-07-15 ENCOUNTER — Encounter (HOSPITAL_BASED_OUTPATIENT_CLINIC_OR_DEPARTMENT_OTHER): Payer: Medicare Other | Admitting: Oncology

## 2011-07-15 DIAGNOSIS — C187 Malignant neoplasm of sigmoid colon: Secondary | ICD-10-CM

## 2011-07-15 LAB — CBC WITH DIFFERENTIAL/PLATELET
Eosinophils Absolute: 0.2 10*3/uL (ref 0.0–0.5)
HGB: 12.8 g/dL (ref 11.6–15.9)
LYMPH%: 21.9 % (ref 14.0–49.7)
MCH: 31.7 pg (ref 25.1–34.0)
MCHC: 34.3 g/dL (ref 31.5–36.0)
MCV: 92.3 fL (ref 79.5–101.0)
MONO#: 0.4 10*3/uL (ref 0.1–0.9)
NEUT%: 68.8 % (ref 38.4–76.8)
RBC: 4.03 10*6/uL (ref 3.70–5.45)

## 2011-08-08 ENCOUNTER — Other Ambulatory Visit: Payer: Self-pay | Admitting: Oncology

## 2011-08-08 ENCOUNTER — Encounter (HOSPITAL_BASED_OUTPATIENT_CLINIC_OR_DEPARTMENT_OTHER): Payer: Medicare Other | Admitting: Oncology

## 2011-08-08 DIAGNOSIS — C187 Malignant neoplasm of sigmoid colon: Secondary | ICD-10-CM

## 2011-08-08 LAB — COMPREHENSIVE METABOLIC PANEL
ALT: <8 U/L (ref 0–35)
BUN: 19 mg/dL (ref 6–23)
CO2: 21 mEq/L (ref 19–32)
Creatinine, Ser: 0.89 mg/dL (ref 0.50–1.10)
Total Bilirubin: 0.5 mg/dL (ref 0.3–1.2)

## 2011-08-08 LAB — CBC WITH DIFFERENTIAL/PLATELET
BASO%: 0.4 % (ref 0.0–2.0)
HCT: 38.3 % (ref 34.8–46.6)
LYMPH%: 22.7 % (ref 14.0–49.7)
MCH: 33.9 pg (ref 25.1–34.0)
MCHC: 34.9 g/dL (ref 31.5–36.0)
MONO#: 0.6 10*3/uL (ref 0.1–0.9)
NEUT%: 67.8 % (ref 38.4–76.8)
Platelets: 226 10*3/uL (ref 145–400)

## 2011-08-29 ENCOUNTER — Other Ambulatory Visit: Payer: Self-pay | Admitting: Oncology

## 2011-08-29 ENCOUNTER — Encounter (HOSPITAL_BASED_OUTPATIENT_CLINIC_OR_DEPARTMENT_OTHER): Payer: Medicare Other | Admitting: Oncology

## 2011-08-29 DIAGNOSIS — C187 Malignant neoplasm of sigmoid colon: Secondary | ICD-10-CM

## 2011-08-29 LAB — CBC WITH DIFFERENTIAL/PLATELET
Basophils Absolute: 0 10*3/uL (ref 0.0–0.1)
Eosinophils Absolute: 0.2 10*3/uL (ref 0.0–0.5)
HCT: 40.5 % (ref 34.8–46.6)
HGB: 13.8 g/dL (ref 11.6–15.9)
LYMPH%: 29.5 % (ref 14.0–49.7)
MCHC: 34.2 g/dL (ref 31.5–36.0)
MONO#: 0.5 10*3/uL (ref 0.1–0.9)
NEUT#: 4.9 10*3/uL (ref 1.5–6.5)
NEUT%: 60.7 % (ref 38.4–76.8)
Platelets: 248 10*3/uL (ref 145–400)
WBC: 8 10*3/uL (ref 3.9–10.3)

## 2011-08-29 LAB — COMPREHENSIVE METABOLIC PANEL
ALT: 9 U/L (ref 0–35)
AST: 16 U/L (ref 0–37)
Albumin: 4 g/dL (ref 3.5–5.2)
Alkaline Phosphatase: 66 U/L (ref 39–117)
BUN: 18 mg/dL (ref 6–23)
Potassium: 3.9 mEq/L (ref 3.5–5.3)
Sodium: 140 mEq/L (ref 135–145)

## 2011-09-03 LAB — URINALYSIS, ROUTINE W REFLEX MICROSCOPIC
Bilirubin Urine: NEGATIVE
Hgb urine dipstick: NEGATIVE
Ketones, ur: NEGATIVE
Protein, ur: NEGATIVE
Urobilinogen, UA: 0.2

## 2011-09-03 LAB — CBC
Hemoglobin: 14.8
MCHC: 35.1
MCHC: 35.2
Platelets: 204
RBC: 3.89
RDW: 12.2
WBC: 12.3 — ABNORMAL HIGH

## 2011-09-03 LAB — COMPREHENSIVE METABOLIC PANEL
ALT: 17
BUN: 9
Calcium: 9.3
Glucose, Bld: 89
Sodium: 139
Total Protein: 6.8

## 2011-09-03 LAB — TYPE AND SCREEN
ABO/RH(D): O POS
Antibody Screen: NEGATIVE

## 2011-09-03 LAB — ABO/RH: ABO/RH(D): O POS

## 2011-09-03 LAB — PROTIME-INR
INR: 1
Prothrombin Time: 13.3

## 2011-09-03 LAB — BASIC METABOLIC PANEL
Calcium: 8 — ABNORMAL LOW
Creatinine, Ser: 0.6
GFR calc Af Amer: >60

## 2011-09-18 ENCOUNTER — Emergency Department (HOSPITAL_COMMUNITY)
Admission: EM | Admit: 2011-09-18 | Discharge: 2011-09-18 | Disposition: A | Discharge status: Home / Self Care | Payer: Medicare Other | Attending: Emergency Medicine | Admitting: Emergency Medicine

## 2011-09-18 DIAGNOSIS — H1130 Conjunctival hemorrhage, unspecified eye: Secondary | ICD-10-CM | POA: Insufficient documentation

## 2011-09-18 DIAGNOSIS — H5789 Other specified disorders of eye and adnexa: Secondary | ICD-10-CM | POA: Insufficient documentation

## 2011-09-18 DIAGNOSIS — I10 Essential (primary) hypertension: Secondary | ICD-10-CM | POA: Insufficient documentation

## 2011-09-18 DIAGNOSIS — Z85038 Personal history of other malignant neoplasm of large intestine: Secondary | ICD-10-CM | POA: Insufficient documentation

## 2011-09-19 ENCOUNTER — Encounter (HOSPITAL_BASED_OUTPATIENT_CLINIC_OR_DEPARTMENT_OTHER): Payer: Medicare Other | Admitting: Oncology

## 2011-09-19 ENCOUNTER — Other Ambulatory Visit: Payer: Self-pay | Admitting: Oncology

## 2011-09-19 DIAGNOSIS — Z23 Encounter for immunization: Secondary | ICD-10-CM

## 2011-09-19 DIAGNOSIS — C187 Malignant neoplasm of sigmoid colon: Secondary | ICD-10-CM

## 2011-09-19 LAB — CBC WITH DIFFERENTIAL/PLATELET
Eosinophils Absolute: 0.1 10*3/uL (ref 0.0–0.5)
HCT: 40.4 % (ref 34.8–46.6)
LYMPH%: 27.8 % (ref 14.0–49.7)
MONO#: 0.5 10*3/uL (ref 0.1–0.9)
NEUT#: 4.7 10*3/uL (ref 1.5–6.5)
Platelets: 206 10*3/uL (ref 145–400)
RBC: 3.8 10*6/uL (ref 3.70–5.45)
WBC: 7.5 10*3/uL (ref 3.9–10.3)
lymph#: 2.1 10*3/uL (ref 0.9–3.3)

## 2011-10-10 ENCOUNTER — Encounter (HOSPITAL_BASED_OUTPATIENT_CLINIC_OR_DEPARTMENT_OTHER): Payer: Medicare Other | Admitting: Oncology

## 2011-10-10 ENCOUNTER — Other Ambulatory Visit: Payer: Self-pay | Admitting: Oncology

## 2011-10-10 DIAGNOSIS — C187 Malignant neoplasm of sigmoid colon: Secondary | ICD-10-CM

## 2011-10-10 DIAGNOSIS — L270 Generalized skin eruption due to drugs and medicaments taken internally: Secondary | ICD-10-CM

## 2011-10-10 DIAGNOSIS — T451X5A Adverse effect of antineoplastic and immunosuppressive drugs, initial encounter: Secondary | ICD-10-CM

## 2011-10-10 DIAGNOSIS — T50904A Poisoning by unspecified drugs, medicaments and biological substances, undetermined, initial encounter: Secondary | ICD-10-CM

## 2011-10-10 DIAGNOSIS — Z79899 Other long term (current) drug therapy: Secondary | ICD-10-CM

## 2011-10-10 LAB — CBC WITH DIFFERENTIAL/PLATELET
BASO%: 0.6 % (ref 0.0–2.0)
Basophils Absolute: 0 10*3/uL (ref 0.0–0.1)
Eosinophils Absolute: 0.2 10*3/uL (ref 0.0–0.5)
HCT: 38.6 % (ref 34.8–46.6)
HGB: 13.1 g/dL (ref 11.6–15.9)
LYMPH%: 33.4 % (ref 14.0–49.7)
MCHC: 33.9 g/dL (ref 31.5–36.0)
MONO#: 0.5 10*3/uL (ref 0.1–0.9)
NEUT%: 53.2 % (ref 38.4–76.8)
Platelets: 200 10*3/uL (ref 145–400)
WBC: 4.9 10*3/uL (ref 3.9–10.3)
lymph#: 1.6 10*3/uL (ref 0.9–3.3)

## 2011-10-14 ENCOUNTER — Other Ambulatory Visit: Payer: Self-pay | Admitting: *Deleted

## 2011-10-14 NOTE — Telephone Encounter (Signed)
Received fax from [name] For refill on Xeloda. Patient started her last cycle of Xeloda today and does not require refills

## 2011-10-25 ENCOUNTER — Telehealth: Payer: Self-pay | Admitting: Oncology

## 2011-10-25 NOTE — Telephone Encounter (Signed)
Per nurse called pt to clarify appts. Pt only needs lb/fu 12/11 as scheduled no wkly lbs or ct. 11/20 appt cx'd. Pt aware no lb or ct needed [facility] for 12/11.

## 2011-10-29 ENCOUNTER — Other Ambulatory Visit: Payer: Medicare Other | Admitting: Lab

## 2011-10-29 ENCOUNTER — Encounter (INDEPENDENT_AMBULATORY_CARE_PROVIDER_SITE_OTHER): Payer: Self-pay | Admitting: Surgery

## 2011-10-30 ENCOUNTER — Ambulatory Visit (INDEPENDENT_AMBULATORY_CARE_PROVIDER_SITE_OTHER): Payer: Medicare Other | Admitting: Surgery

## 2011-10-30 ENCOUNTER — Encounter (INDEPENDENT_AMBULATORY_CARE_PROVIDER_SITE_OTHER): Payer: Self-pay | Admitting: Surgery

## 2011-10-30 VITALS — BP 158/92 | HR 80 | Temp 97.6°F | Resp 16 | Ht 65.0 in | Wt 140.0 lb

## 2011-10-30 DIAGNOSIS — Z85038 Personal history of other malignant neoplasm of large intestine: Secondary | ICD-10-CM

## 2011-10-30 NOTE — Patient Instructions (Signed)
1.  Will discuss and plan the colostomy reversal at our next visit.

## 2011-10-30 NOTE — Progress Notes (Signed)
[facility]  Ovidio Kin, MD,  FACS [address] [place]  [address] [place], [place]    [address] Phone:  [phone] FAX:  [phone]   Re:   [name] DOB:   1931-12-23 MRN:   [id number]  ASSESSMENT AND PLAN: 1. T4 N0 sigmoid colon carcinoma with focal perforation.   Resection with end colostomy, hysterectomy, cystostomy - 04/02/2011.  Followed by Dr. Leonard Schwartz. Sherrill from oncology.  Just finished xeloda last week.    I tried to call Dr. Truett Perna, but he was out of office.  Ms. Arenivas wants to reverse the colostomy, but I want to wait a full year from her surgery.  Also, consider repeat CT/PET prior to reversal.  She will also need a BE prior to reversal.  She will need ureteral stents at surgery.  Though disappointed, I think I explained to Ms. Chunn why I want to wait.  She is to see Misty Stanley (with Dr. Truett Perna) in December.  I'll see Ms Postema back in March, 2013, and will look at ostomy reversal at that time.  2. Diverticular abscess.   3. Hypertension.  4. Hypercholesterolemia.    HISTORY OF PRESENT ILLNESS: Chief Complaint  Patient presents with  . Routine Post Op    LTF colon ca    [name] is a 77 y.o. (DOB: 1932-11-12)  white female who is a patient of WOLTERS,SHARON A, MD and comes to me today for follow up of colon cancer. The patient looks good today. She is in good spirits. She completed a proximal 6 months of Xeloda last week. Her only problem was Xeloda was that her hands and feet peeled some.  She thinks she's gained 10 pounds over the last several months. Physically she feels okay, though she says she somewhat forgetful.  She comes by herself today. She plans to spend Thanksgiving with her daughter, Bonita Quin.  PHYSICAL EXAM: BP 158/92  Pulse 80  Temp(Src) 97.6 F (36.4 C) (Temporal)  Resp 16  Ht 5\' 5"  (1.651 m)  Wt 140 lb (63.504 kg)  BMI 23.30 kg/m2  General: Older WF who is alert and generally healthy  appearing.  HEENT: Normal. Pupils equal. Good dentition. Neck: Supple. No mass.  No thyroid mass.  Carotid pulse okay with no bruit. Lymph Nodes:  No supraclavicular or cervical nodes. Lungs: Clear to auscultation and symmetric breath sounds. Heart:  RRR. No murmur or rub. Abdomen: Soft. No mass. No tenderness. No hernia. Normal bowel sounds. Lower midline scar..  Left mid abd colostomy. Rectal: Not done. Extremities:  Good strength and ROM  in upper and lower extremities. Neurologic:  Grossly intact to motor and sensory function. Psychiatric: Has normal mood and affect. Behavior is normal.    DATA REVIEWED: Note from Dr. Truett Perna.   Ovidio Kin, MD, FACS Office:  [phone]

## 2011-11-05 ENCOUNTER — Other Ambulatory Visit: Payer: Medicare Other | Admitting: Lab

## 2011-11-05 ENCOUNTER — Ambulatory Visit: Payer: Medicare Other

## 2011-11-05 ENCOUNTER — Ambulatory Visit: Payer: Medicare Other | Admitting: Internal Medicine

## 2011-11-19 ENCOUNTER — Ambulatory Visit (HOSPITAL_BASED_OUTPATIENT_CLINIC_OR_DEPARTMENT_OTHER): Payer: Medicare Other | Admitting: Nurse Practitioner

## 2011-11-19 ENCOUNTER — Other Ambulatory Visit: Payer: Self-pay | Admitting: *Deleted

## 2011-11-19 ENCOUNTER — Other Ambulatory Visit (HOSPITAL_BASED_OUTPATIENT_CLINIC_OR_DEPARTMENT_OTHER): Payer: Medicare Other | Admitting: Lab

## 2011-11-19 VITALS — BP 157/89 | HR 89 | Temp 98.9°F | Ht 65.0 in | Wt 142.3 lb

## 2011-11-19 DIAGNOSIS — G8929 Other chronic pain: Secondary | ICD-10-CM

## 2011-11-19 DIAGNOSIS — Z85038 Personal history of other malignant neoplasm of large intestine: Secondary | ICD-10-CM

## 2011-11-19 DIAGNOSIS — C187 Malignant neoplasm of sigmoid colon: Secondary | ICD-10-CM

## 2011-11-19 DIAGNOSIS — M549 Dorsalgia, unspecified: Secondary | ICD-10-CM

## 2011-11-19 LAB — CBC WITH DIFFERENTIAL/PLATELET
BASO%: 0.6 % (ref 0.0–2.0)
Basophils Absolute: 0 10*3/uL (ref 0.0–0.1)
EOS%: 2.8 % (ref 0.0–7.0)
HCT: 39.5 % (ref 34.8–46.6)
HGB: 13.3 g/dL (ref 11.6–15.9)
LYMPH%: 32.7 % (ref 14.0–49.7)
MCH: 34.4 pg — ABNORMAL HIGH (ref 25.1–34.0)
MCHC: 33.7 g/dL (ref 31.5–36.0)
MCV: 102.1 fL — ABNORMAL HIGH (ref 79.5–101.0)
MONO%: 8.1 % (ref 0.0–14.0)
NEUT%: 55.8 % (ref 38.4–76.8)
Platelets: 179 10*3/uL (ref 145–400)

## 2011-11-20 NOTE — Progress Notes (Signed)
OFFICE PROGRESS NOTE  Interval history:  [name] returns as scheduled. She completed the eighth and final cycle of adjuvant Xeloda chemotherapy beginning 10/14/2011. She overall feels well. Colostomy functioning normally. She denies diarrhea. No hematochezia or melena. No nausea or vomiting. She denies hand or foot pain or redness. No mouth sores.  She reports a history of chronic back pain and "spasms". She takes hydrocodone as needed.   Objective: Blood pressure 157/89, pulse 89, temperature 98.9 F (37.2 C), temperature source Oral, height 5\' 5"  (1.651 m), weight 142 lb 4.8 oz (64.547 kg).  Oropharynx is without thrush or ulceration. No palpable cervical, supraclavicular or axillary lymph nodes. Lungs are clear. No wheezes or rales. Regular cardiac rhythm. Abdomen is soft and nontender. No hepatomegaly. Left lower quadrant colostomy. Extremities are without edema. Palms with mild hyperpigmentation and skin thickening. No skin breakdown.   Lab Results: Lab Results  Component Value Date   WBC 5.3 11/19/2011   HGB 13.3 11/19/2011   HCT 39.5 11/19/2011   MCV 102.1* 11/19/2011   PLT 179 11/19/2011     Studies/Results: No results found.  Medications: I have reviewed the patient's current medications.  Assessment/Plan:  1. Moderately-differentiated adenocarcinoma of the sigmoid colon (T4 N0) with associated bowel perforation, status post a sigmoid colectomy and diverting colostomy 04/02/2011.  She completed the eighth and final cycle of adjuvant Xeloda chemotherapy beginning 10/14/2011. 2. Hand/foot syndrome secondary to Xeloda, improved. 3. Admission with a perforated sigmoid colon tumor and an abdominal abscess in January 2012. 4. Microcytic anemia:  Likely due to iron deficiency, resolved. 5. Chronic back pain-we gave her a prescription for Vicodin 5/500 one tablet every 6 hours as needed.   Disposition:  [name] has completed 8 cycles of adjuvant Xeloda  chemotherapy.  She will return for a CEA in 3 months. She will return for an office visit in 6 months. She will contact the office in the interim with any problems. Dr. Truett Perna recommends a restaging CT scan at a one-year interval from diagnosis.    [name] ANP/GNP-BC

## 2011-11-29 ENCOUNTER — Other Ambulatory Visit: Payer: Self-pay | Admitting: *Deleted

## 2011-11-29 DIAGNOSIS — C189 Malignant neoplasm of colon, unspecified: Secondary | ICD-10-CM

## 2011-11-29 MED ORDER — HYDROCODONE-ACETAMINOPHEN 5-500 MG PO CAPS: 1.0000 | ORAL_CAPSULE | Freq: Four times a day (QID) | ORAL | Status: AC | PRN

## 2012-02-18 ENCOUNTER — Other Ambulatory Visit: Payer: Medicare Other | Admitting: Lab

## 2012-02-18 DIAGNOSIS — Z85038 Personal history of other malignant neoplasm of large intestine: Secondary | ICD-10-CM

## 2012-03-04 ENCOUNTER — Encounter (INDEPENDENT_AMBULATORY_CARE_PROVIDER_SITE_OTHER): Payer: Self-pay | Admitting: Surgery

## 2012-03-04 ENCOUNTER — Ambulatory Visit (INDEPENDENT_AMBULATORY_CARE_PROVIDER_SITE_OTHER): Payer: Medicare Other | Admitting: Surgery

## 2012-03-04 VITALS — BP 134/78 | HR 84 | Temp 97.4°F | Resp 18 | Ht 65.5 in | Wt 149.6 lb

## 2012-03-04 DIAGNOSIS — Z85038 Personal history of other malignant neoplasm of large intestine: Secondary | ICD-10-CM

## 2012-03-04 NOTE — Progress Notes (Signed)
[facility]  Ovidio Kin, MD,  FACS [address]  [address] [place], [place]    [address] Phone:  [phone] FAX:  [phone]   Re:   [name] DOB:   [birth year] MRN:   [id number]  ASSESSMENT AND PLAN: 1.  T4, N0 sigmoid colon cancer - has end colostomy.  Resected 04/02/2011.  Final path - 4.5 cm cancer, 0/40 nodes, cancer cells on the left fallopian tube.  Oncology - B. Sherrill, GI - M. Magod  She had 8 cycles of Xeloda - finished December 2012.  CEA - 2.4 - 02/18/2012.  For updated CT scans sometime in April.  Disease free from my standpoint.  She is to see Dr. Truett Perna, get repeat CT scans, contact Dr. Ewing Schlein for follow up colonoscopy - then we will consider reversing her colostomy.  I gave her an appt for 6 months.  2.  20 pound weight gain over the last year. 3.  Hypertension. 4.  Hypercholesteremia. 5.  Chronic back pain.  On muscle relaxants.  HISTORY OF PRESENT ILLNESS: Chief Complaint  Patient presents with  . Colon Cancer    LTF    [name] is a 78 y.o. (DOB: [birth year])  whtie female who is a patient of Emeterio Reeve, MD, MD and comes to me today for colon cancer.  Doing well.  Has gained weight.  In good spirits.  He hands and feet are better.  Her back pain bothers her some.  We talked about reversing her colostomy.  I want hers to see Dr. Truett Perna, get repeat CT scans, contact Dr. Ewing Schlein for follow up colonoscopy.  Then if all is well, will look at reversal.  She would need a BE before reversing her, but I have not scheduled this yet.  PHYSICAL EXAM: BP 134/78  Pulse 84  Temp(Src) 97.4 F (36.3 C) (Temporal)  Resp 18  Ht 5' 5.5" (1.664 m)  Wt 149 lb 9.6 oz (67.858 kg)  BMI 24.52 kg/m2  General: WN WF who is alert and generally healthy appearing.  HEENT: Normal. Pupils equal. Good dentition. Neck: Supple. No mass.  No thyroid mass.  Carotid pulse okay with no bruit. Lymph Nodes:  No supraclavicular  or cervical nodes. Lungs: Clear to auscultation and symmetric breath sounds. Heart:  RRR. No murmur or rub.  Abdomen: Soft. No mass. No tenderness. LLQ colostomy.  Midline wound okay.  no hernia.  Rectal: Not done. Extremities:  Good strength and ROM  in upper and lower extremities. Neurologic:  Grossly intact to motor and sensory function. Psychiatric: Has normal mood and affect. Behavior is normal.   DATA REVIEWED: Labs and progress notes.   Ovidio Kin, MD, FACS Office:  [phone]

## 2012-04-07 ENCOUNTER — Encounter: Payer: Self-pay | Admitting: *Deleted

## 2012-04-07 NOTE — Progress Notes (Signed)
[name] with Dr. Ewing Schlein asking if Dr. Truett Perna had ordered the CT scan patient is due late April? They will be scheduling a colonoscopy prior to possible colostomy reversal by Dr. Ezzard Standing. Per Dr. Truett Perna: He has not ordered this and feels more appropriate for surgeon to order this exam.

## 2012-04-23 ENCOUNTER — Other Ambulatory Visit: Payer: Self-pay | Admitting: Gastroenterology

## 2012-04-23 DIAGNOSIS — K5792 Diverticulitis of intestine, part unspecified, without perforation or abscess without bleeding: Secondary | ICD-10-CM

## 2012-04-27 ENCOUNTER — Ambulatory Visit
Admission: RE | Admit: 2012-04-27 | Discharge: 2012-04-27 | Disposition: A | Discharge status: Home / Self Care | Payer: Medicare Other | Source: Ambulatory Visit | Attending: Gastroenterology | Admitting: Gastroenterology

## 2012-04-27 DIAGNOSIS — K5792 Diverticulitis of intestine, part unspecified, without perforation or abscess without bleeding: Secondary | ICD-10-CM

## 2012-04-27 MED ORDER — IOHEXOL 300 MG/ML  SOLN
100.0000 mL | Freq: Once | INTRAMUSCULAR | Status: AC | PRN
  Administered 2012-04-27: 100 mL via INTRAVENOUS

## 2012-05-19 ENCOUNTER — Telehealth: Payer: Self-pay | Admitting: Oncology

## 2012-05-19 ENCOUNTER — Ambulatory Visit (HOSPITAL_BASED_OUTPATIENT_CLINIC_OR_DEPARTMENT_OTHER): Payer: Medicare Other | Admitting: Oncology

## 2012-05-19 VITALS — BP 131/85 | HR 85 | Temp 97.0°F | Ht 65.5 in | Wt 153.1 lb

## 2012-05-19 DIAGNOSIS — C187 Malignant neoplasm of sigmoid colon: Secondary | ICD-10-CM

## 2012-05-19 DIAGNOSIS — Z85038 Personal history of other malignant neoplasm of large intestine: Secondary | ICD-10-CM

## 2012-05-19 NOTE — Progress Notes (Signed)
   [facility]    OFFICE PROGRESS NOTE   INTERVAL HISTORY:   She returns as scheduled. She feels well. No difficulty with bowel function. The skin over the hands has returned normal. She had a procedure on the great toenails by a podiatrist yesterday.   Objective:  Vital signs in last 24 hours:  Blood pressure 131/85, pulse 85, temperature 97 F (36.1 C), temperature source Oral, height 5' 5.5" (1.664 m), weight 153 lb 1.6 oz (69.446 kg).    HEENT: Neck without mass Lymphatics: No cervical, supraclavicular, axillary, or inguinal nodes Resp: Lungs clear bilaterally Cardio: Regular rate and rhythm GI: No hepatosplenomegaly, no mass, nontender Vascular: No leg edema      Lab Results: CEA 2.4 on 02/18/2012    Medications: I have reviewed the patient's current medications.  Assessment/Plan: 1. Moderately-differentiated adenocarcinoma of the sigmoid colon (T4 N0) with associated bowel perforation, status post a sigmoid colectomy and diverting colostomy 04/02/2011. She completed the eighth and final cycle of adjuvant Xeloda chemotherapy beginning 10/14/2011. 2. Hand/foot syndrome secondary to Xeloda, improved. 3. Admission with a perforated sigmoid colon tumor and an abdominal abscess in January 2012. 4. Microcytic anemia: Likely due to iron deficiency, resolved.   Disposition:  She remains in clinical remission from colon cancer. She will undergo a colonoscopy on 06/03/2012. Ms. Hebb will return for an office visit and CEA in 6 months.   Thornton Papas, MD  05/19/2012  12:27 PM

## 2012-05-19 NOTE — Telephone Encounter (Signed)
gv pt appt schedule for dec.  °

## 2012-06-03 ENCOUNTER — Other Ambulatory Visit: Payer: Self-pay | Admitting: Gastroenterology

## 2012-06-18 ENCOUNTER — Other Ambulatory Visit (INDEPENDENT_AMBULATORY_CARE_PROVIDER_SITE_OTHER): Payer: Self-pay | Admitting: Surgery

## 2012-06-18 ENCOUNTER — Ambulatory Visit (INDEPENDENT_AMBULATORY_CARE_PROVIDER_SITE_OTHER): Payer: Medicare Other | Admitting: Surgery

## 2012-06-18 VITALS — BP 144/88 | HR 71 | Temp 97.6°F | Resp 18 | Ht 66.0 in | Wt 155.8 lb

## 2012-06-18 DIAGNOSIS — Z85038 Personal history of other malignant neoplasm of large intestine: Secondary | ICD-10-CM

## 2012-06-18 NOTE — Progress Notes (Signed)
[facility]  Ovidio Kin, MD,  FACS [address] [place]  [address] [place], [place]    [address] Phone:  [phone] FAX:  [phone]   Re:   [name] DOB:   [birth year] MRN:   [id number]  ASSESSMENT AND PLAN: 1.  T4, N0 sigmoid colon cancer - has end colostomy.  Resected 04/02/2011.  (Had hysterectomy at the same time)  Final path - 4.5 cm cancer, 0/40 nodes, cancer cells on the left fallopian tube.  Oncology - B. Sherrill, GI - M. Magod  She had 8 cycles of Xeloda - finished December 2012.  CEA - 2.4 - 02/18/2012.  I discussed with her about reversing her colostomy.  I want to do a BE first to define her anatomy (the CT scan does not show the rectal stump well).  I reviewed with the patient colostomy reversal. I discussed the role of laparoscopic and open surgery in colon surgery.  I reviewed the risks of surgery, including, but not limited to, infection, bleeding, nerve injury, anastomotic leaks, and possibility of colostomy.  The biggest problem would be that I may not be able to do the reversal or that she would need a protective ostomy.  She will also need ureteral stents for surgery.  I went over the preoperative mechanical and antibiotic bowel prep for colon surgery and provided prescriptions for these.  I provided the patient literature on colon surgery.  I tried to answer all questions for the patient.  I told her to talk to her daughter, Bonita Quin, and if she had questions to call me.  2.  20+ pound weight gain over the last year. 3.  Hypertension. 4.  Hypercholesteremia. 5.  Chronic back pain.    On muscle relaxants.  Takes hydrocodone 2 or 3 times per week.  HISTORY OF PRESENT ILLNESS: Chief Complaint  Patient presents with  . Follow-up    Recheck colon    [name] is a 78 y.o. (DOB: Aug 10, 1932)  whtie female who is a patient of WOLTERS,SHARON A, MD and comes to me today for follow up of a colon resection for cancer.  She has a  colostomy that she wants reversed.  She is doing well.  Her back pain bothers her some.  She breaths with pursed lips.  She had a CT scan 04/27/2012 which was negative. She had a colonoscopy by Dr. Ewing Schlein 06/05/2012 - he removed one polyp which proved benign. She is by herself.  Her daughter, Bonita Quin, who works at [employer] hospital had knee surgery recently.  Her daughter, who had Parkinson's disease, died in November 18, 2011.  She has a son who lives outside of [place], [place].  PHYSICAL EXAM: BP 144/88  Pulse 71  Temp 97.6 F (36.4 C) (Temporal)  Resp 18  Ht 5\' 6"  (1.676 m)  Wt 155 lb 12.8 oz (70.67 kg)  BMI 25.15 kg/m2  General: WN WF who is alert and generally healthy appearing.  HEENT: Normal. Pupils equal. Good dentition. Neck: Supple. No mass.  No thyroid mass.  Carotid pulse okay with no bruit. Lymph Nodes:  No supraclavicular or cervical nodes. Lungs: Clear to auscultation and symmetric breath sounds.  She breaths with pursed lips. Heart:  RRR. No murmur or rub.  Abdomen: Soft. No mass. No tenderness. LLQ colostomy.  Midline wound okay.  no hernia.  Rectal: Not done. Extremities:  Good strength and ROM  in upper and lower extremities. Neurologic:  Grossly intact to motor and sensory function. Psychiatric:  Has normal mood and affect. Behavior is normal.   DATA REVIEWED: CT scan and Dr. Marlane Hatcher colonoscopy report.  Ovidio Kin, MD, FACS Office:  [phone]

## 2012-06-24 ENCOUNTER — Ambulatory Visit
Admission: RE | Admit: 2012-06-24 | Discharge: 2012-06-24 | Disposition: A | Discharge status: Home / Self Care | Payer: Medicare Other | Source: Ambulatory Visit | Attending: Surgery | Admitting: Surgery

## 2012-06-24 DIAGNOSIS — Z85038 Personal history of other malignant neoplasm of large intestine: Secondary | ICD-10-CM

## 2012-07-07 ENCOUNTER — Encounter: Payer: Self-pay | Admitting: Vascular Surgery

## 2012-07-08 ENCOUNTER — Other Ambulatory Visit: Payer: Self-pay | Admitting: Urology

## 2012-07-17 ENCOUNTER — Encounter (HOSPITAL_COMMUNITY): Payer: Self-pay | Admitting: Pharmacy Technician

## 2012-07-24 ENCOUNTER — Ambulatory Visit (HOSPITAL_COMMUNITY)
Admission: RE | Admit: 2012-07-24 | Discharge: 2012-07-24 | Disposition: A | Discharge status: Home / Self Care | Payer: Medicare Other | Source: Ambulatory Visit | Attending: Surgery | Admitting: Surgery

## 2012-07-24 ENCOUNTER — Encounter (HOSPITAL_COMMUNITY)
Admission: RE | Admit: 2012-07-24 | Discharge: 2012-07-24 | Disposition: A | Discharge status: Home / Self Care | Payer: Medicare Other | Source: Ambulatory Visit | Attending: Surgery | Admitting: Surgery

## 2012-07-24 ENCOUNTER — Encounter (HOSPITAL_COMMUNITY): Payer: Self-pay

## 2012-07-24 DIAGNOSIS — Z01812 Encounter for preprocedural laboratory examination: Secondary | ICD-10-CM | POA: Insufficient documentation

## 2012-07-24 DIAGNOSIS — Z933 Colostomy status: Secondary | ICD-10-CM | POA: Insufficient documentation

## 2012-07-24 DIAGNOSIS — Z01810 Encounter for preprocedural cardiovascular examination: Secondary | ICD-10-CM | POA: Insufficient documentation

## 2012-07-24 DIAGNOSIS — Z01818 Encounter for other preprocedural examination: Secondary | ICD-10-CM | POA: Insufficient documentation

## 2012-07-24 LAB — CBC
HCT: 42.7 % (ref 36.0–46.0)
Hemoglobin: 14.5 g/dL (ref 12.0–15.0)
MCH: 31.5 pg (ref 26.0–34.0)
MCHC: 34 g/dL (ref 30.0–36.0)
MCV: 92.6 fL (ref 78.0–100.0)
RBC: 4.61 MIL/uL (ref 3.87–5.11)

## 2012-07-24 LAB — DIFFERENTIAL
Basophils Absolute: 0 10*3/uL (ref 0.0–0.1)
Basophils Relative: 0 % (ref 0–1)
Lymphocytes Relative: 26 % (ref 12–46)
Monocytes Absolute: 0.5 10*3/uL (ref 0.1–1.0)
Neutro Abs: 4.9 10*3/uL (ref 1.7–7.7)
Neutrophils Relative %: 66 % (ref 43–77)

## 2012-07-24 LAB — COMPREHENSIVE METABOLIC PANEL
AST: 15 U/L (ref 0–37)
BUN: 18 mg/dL (ref 6–23)
CO2: 31 mEq/L (ref 19–32)
Calcium: 9 mg/dL (ref 8.4–10.5)
Chloride: 103 mEq/L (ref 96–112)
Creatinine, Ser: 0.7 mg/dL (ref 0.50–1.10)
GFR calc Af Amer: >90 mL/min (ref >90)
GFR calc non Af Amer: 81 mL/min — ABNORMAL LOW (ref >90)
Glucose, Bld: 93 mg/dL (ref 70–99)
Total Bilirubin: 0.4 mg/dL (ref 0.3–1.2)

## 2012-07-24 LAB — SURGICAL PCR SCREEN: MRSA, PCR: NEGATIVE

## 2012-07-24 MED ORDER — CHLORHEXIDINE GLUCONATE 4 % EX LIQD: 1.0000 "application " | Freq: Once | CUTANEOUS | Status: DC

## 2012-07-24 NOTE — Patient Instructions (Addendum)
20 [name]  07/24/2012   Your procedure is scheduled on:  07/30/12 AT 11:00 AM  Report to SHORT STAY DEPT  at 8:30  AM.  Call this number if you have problems the morning of surgery: [phone]   Remember:   Do not eat food or drink liquids AFTER MIDNIGHT    Take these medicines the morning of surgery with A SIP OF WATER: NONE   Do not wear jewelry, make-up or nail polish.  Do not wear lotions, powders, or perfumes.   Do not shave legs or underarms 48 hrs. before surgery (men may shave face)  Do not bring valuables to the hospital.  Contacts, dentures or bridgework may not be worn into surgery.  Leave suitcase in the car. After surgery it may be brought to your room.  For patients admitted to the hospital, checkout time is 11:00 AM the day of discharge.   Patients discharged the day of surgery will not be allowed to drive home. PT MUST HAVE SOME ONE STAY WITH THEM FOR 24 HRS AFTER GOING HOME    Special Instructions:   Please read over the following fact sheets that you were given: MRSA  Information               SHOWER WITH BETASEPT THE NIGHT BEFORE SURGERY AND THE MORNING OF SURGERY               FOLLOW BOWEL PREP FROM OFFICE               NO ASPIRIN OR HERBAL MEDICATION 5-7 DAYS PREOP

## 2012-07-28 ENCOUNTER — Other Ambulatory Visit: Payer: Self-pay | Admitting: Urology

## 2012-07-29 NOTE — Progress Notes (Signed)
Pt notified of time change to 10:45 am and to arrive at short stay at 8:15 am - all other instructions remain same.

## 2012-07-30 ENCOUNTER — Inpatient Hospital Stay (HOSPITAL_COMMUNITY): Payer: Medicare Other | Admitting: Anesthesiology

## 2012-07-30 ENCOUNTER — Inpatient Hospital Stay (HOSPITAL_COMMUNITY): Payer: Medicare Other

## 2012-07-30 ENCOUNTER — Inpatient Hospital Stay (HOSPITAL_COMMUNITY)
Admission: RE | Admit: 2012-07-30 | Discharge: 2012-08-04 | DRG: 337 | Disposition: A | Discharge status: Home / Self Care | Payer: Medicare Other | Source: Ambulatory Visit | Attending: Surgery | Admitting: Surgery

## 2012-07-30 ENCOUNTER — Encounter (HOSPITAL_COMMUNITY): Payer: Self-pay | Admitting: *Deleted

## 2012-07-30 ENCOUNTER — Encounter (HOSPITAL_COMMUNITY)
Admission: RE | Disposition: A | Discharge status: Home / Self Care | Payer: Self-pay | Source: Ambulatory Visit | Attending: Surgery

## 2012-07-30 ENCOUNTER — Encounter (HOSPITAL_COMMUNITY): Payer: Self-pay | Admitting: Anesthesiology

## 2012-07-30 DIAGNOSIS — E7800 Pure hypercholesterolemia, unspecified: Secondary | ICD-10-CM | POA: Diagnosis present

## 2012-07-30 DIAGNOSIS — K660 Peritoneal adhesions (postprocedural) (postinfection): Secondary | ICD-10-CM

## 2012-07-30 DIAGNOSIS — Z85038 Personal history of other malignant neoplasm of large intestine: Secondary | ICD-10-CM

## 2012-07-30 DIAGNOSIS — G8929 Other chronic pain: Secondary | ICD-10-CM | POA: Diagnosis present

## 2012-07-30 DIAGNOSIS — M549 Dorsalgia, unspecified: Secondary | ICD-10-CM | POA: Diagnosis present

## 2012-07-30 DIAGNOSIS — Z9049 Acquired absence of other specified parts of digestive tract: Secondary | ICD-10-CM

## 2012-07-30 DIAGNOSIS — Z79899 Other long term (current) drug therapy: Secondary | ICD-10-CM

## 2012-07-30 DIAGNOSIS — Z433 Encounter for attention to colostomy: Principal | ICD-10-CM

## 2012-07-30 DIAGNOSIS — I10 Essential (primary) hypertension: Secondary | ICD-10-CM | POA: Diagnosis present

## 2012-07-30 DIAGNOSIS — Z90710 Acquired absence of both cervix and uterus: Secondary | ICD-10-CM

## 2012-07-30 HISTORY — PX: COLOSTOMY TAKEDOWN: SHX5258

## 2012-07-30 SURGERY — CLOSURE, COLOSTOMY, LAPAROSCOPIC
Anesthesia: General | Wound class: Contaminated

## 2012-07-30 MED ORDER — ALVIMOPAN 12 MG PO CAPS: 12.0000 mg | ORAL_CAPSULE | Freq: Once | ORAL | Status: DC

## 2012-07-30 MED ORDER — STERILE WATER FOR IRRIGATION IR SOLN
Status: DC | PRN
  Administered 2012-07-30: 3000 mL

## 2012-07-30 MED ORDER — GLYCOPYRROLATE 0.2 MG/ML IJ SOLN
INTRAMUSCULAR | Status: DC | PRN
  Administered 2012-07-30: 0.2 mg via INTRAVENOUS
  Administered 2012-07-30: 0.1 mg via INTRAVENOUS
  Administered 2012-07-30: 0.2 mg via INTRAVENOUS
  Administered 2012-07-30: 0.4 mg via INTRAVENOUS

## 2012-07-30 MED ORDER — LACTATED RINGERS IV SOLN: INTRAVENOUS | Status: DC

## 2012-07-30 MED ORDER — KCL IN DEXTROSE-NACL 20-5-0.45 MEQ/L-%-% IV SOLN
INTRAVENOUS | Status: AC
  Filled 2012-07-30: qty 1000

## 2012-07-30 MED ORDER — BUPIVACAINE-EPINEPHRINE 0.25% -1:200000 IJ SOLN
INTRAMUSCULAR | Status: DC | PRN
  Administered 2012-07-30: 30 mL

## 2012-07-30 MED ORDER — SUCCINYLCHOLINE CHLORIDE 20 MG/ML IJ SOLN
INTRAMUSCULAR | Status: DC | PRN
  Administered 2012-07-30: 100 mg via INTRAVENOUS

## 2012-07-30 MED ORDER — 0.9 % SODIUM CHLORIDE (POUR BTL) OPTIME
TOPICAL | Status: DC | PRN
  Administered 2012-07-30: 1000 mL

## 2012-07-30 MED ORDER — IOHEXOL 300 MG/ML  SOLN
INTRAMUSCULAR | Status: AC
  Filled 2012-07-30: qty 1

## 2012-07-30 MED ORDER — DIPHENHYDRAMINE HCL 50 MG/ML IJ SOLN: 12.5000 mg | Freq: Four times a day (QID) | INTRAMUSCULAR | Status: DC | PRN

## 2012-07-30 MED ORDER — NALOXONE HCL 0.4 MG/ML IJ SOLN: 0.4000 mg | INTRAMUSCULAR | Status: DC | PRN

## 2012-07-30 MED ORDER — LACTATED RINGERS IR SOLN
Status: DC | PRN
  Administered 2012-07-30: 1000 mL

## 2012-07-30 MED ORDER — KCL IN DEXTROSE-NACL 20-5-0.45 MEQ/L-%-% IV SOLN
INTRAVENOUS | Status: DC
  Administered 2012-07-30: 18:00:00 via INTRAVENOUS
  Administered 2012-07-31 (×2): 125 mL/h via INTRAVENOUS
  Administered 2012-07-31: 125 mL via INTRAVENOUS
  Administered 2012-08-01: 125 mL/h via INTRAVENOUS
  Administered 2012-08-01: 12:00:00 via INTRAVENOUS
  Filled 2012-07-30 (×9): qty 1000

## 2012-07-30 MED ORDER — FENTANYL CITRATE 0.05 MG/ML IJ SOLN
INTRAMUSCULAR | Status: DC | PRN
  Administered 2012-07-30 (×5): 25 ug via INTRAVENOUS
  Administered 2012-07-30 (×3): 50 ug via INTRAVENOUS
  Administered 2012-07-30: 25 ug via INTRAVENOUS
  Administered 2012-07-30: 50 ug via INTRAVENOUS

## 2012-07-30 MED ORDER — ONDANSETRON HCL 4 MG/2ML IJ SOLN: 4.0000 mg | Freq: Four times a day (QID) | INTRAMUSCULAR | Status: DC | PRN

## 2012-07-30 MED ORDER — LACTATED RINGERS IV SOLN
INTRAVENOUS | Status: DC | PRN
  Administered 2012-07-30: 12:00:00 via INTRAVENOUS

## 2012-07-30 MED ORDER — LACTATED RINGERS IV SOLN
INTRAVENOUS | Status: DC | PRN
  Administered 2012-07-30 (×2): via INTRAVENOUS

## 2012-07-30 MED ORDER — HYDROMORPHONE HCL PF 1 MG/ML IJ SOLN: 0.2500 mg | INTRAMUSCULAR | Status: DC | PRN

## 2012-07-30 MED ORDER — ACETAMINOPHEN 10 MG/ML IV SOLN
1000.0000 mg | Freq: Four times a day (QID) | INTRAVENOUS | Status: AC
  Administered 2012-07-30 – 2012-07-31 (×4): 1000 mg via INTRAVENOUS
  Filled 2012-07-30 (×4): qty 100

## 2012-07-30 MED ORDER — HEPARIN SODIUM (PORCINE) 5000 UNIT/ML IJ SOLN
5000.0000 [IU] | Freq: Three times a day (TID) | INTRAMUSCULAR | Status: DC
  Administered 2012-07-30 – 2012-08-04 (×14): 5000 [IU] via SUBCUTANEOUS
  Filled 2012-07-30 (×17): qty 1

## 2012-07-30 MED ORDER — ACETAMINOPHEN 10 MG/ML IV SOLN
INTRAVENOUS | Status: AC
  Filled 2012-07-30: qty 100

## 2012-07-30 MED ORDER — NEOSTIGMINE METHYLSULFATE 1 MG/ML IJ SOLN
INTRAMUSCULAR | Status: DC | PRN
  Administered 2012-07-30: 3 mg via INTRAVENOUS
  Administered 2012-07-30: 1 mg via INTRAVENOUS

## 2012-07-30 MED ORDER — MORPHINE SULFATE (PF) 1 MG/ML IV SOLN
INTRAVENOUS | Status: DC
  Administered 2012-07-30: 1 mg via INTRAVENOUS
  Administered 2012-07-31: 4.5 mg via INTRAVENOUS
  Administered 2012-07-31: 3 mg via INTRAVENOUS
  Administered 2012-07-31: 9 mg via INTRAVENOUS
  Administered 2012-07-31: 1.5 mg via INTRAVENOUS
  Administered 2012-08-01: 22:00:00 via INTRAVENOUS
  Administered 2012-08-01: 7.5 mg via INTRAVENOUS
  Administered 2012-08-02: 3.93 mg via INTRAVENOUS
  Filled 2012-07-30 (×2): qty 25

## 2012-07-30 MED ORDER — IOHEXOL 300 MG/ML  SOLN
INTRAMUSCULAR | Status: DC | PRN
  Administered 2012-07-30: 10 mL

## 2012-07-30 MED ORDER — DEXTROSE 5 % IV SOLN: 2.0000 g | INTRAVENOUS | Status: DC

## 2012-07-30 MED ORDER — ALVIMOPAN 12 MG PO CAPS
12.0000 mg | ORAL_CAPSULE | Freq: Two times a day (BID) | ORAL | Status: DC
  Administered 2012-07-31 – 2012-08-04 (×9): 12 mg via ORAL
  Filled 2012-07-30 (×11): qty 1

## 2012-07-30 MED ORDER — LACTATED RINGERS IV SOLN
INTRAVENOUS | Status: DC
  Administered 2012-07-30: 1000 mL via INTRAVENOUS

## 2012-07-30 MED ORDER — CISATRACURIUM BESYLATE (PF) 10 MG/5ML IV SOLN
INTRAVENOUS | Status: DC | PRN
  Administered 2012-07-30: 2 mg via INTRAVENOUS
  Administered 2012-07-30 (×3): 1 mg via INTRAVENOUS
  Administered 2012-07-30 (×2): 2 mg via INTRAVENOUS
  Administered 2012-07-30 (×2): 4 mg via INTRAVENOUS
  Administered 2012-07-30: 8 mg via INTRAVENOUS

## 2012-07-30 MED ORDER — EPHEDRINE SULFATE 50 MG/ML IJ SOLN
INTRAMUSCULAR | Status: DC | PRN
  Administered 2012-07-30 (×2): 5 mg via INTRAVENOUS

## 2012-07-30 MED ORDER — SODIUM CHLORIDE 0.9 % IJ SOLN: 9.0000 mL | INTRAMUSCULAR | Status: DC | PRN

## 2012-07-30 MED ORDER — MORPHINE SULFATE (PF) 1 MG/ML IV SOLN
INTRAVENOUS | Status: AC
  Filled 2012-07-30: qty 25

## 2012-07-30 MED ORDER — DEXTROSE 5 % IV SOLN
2.0000 g | INTRAVENOUS | Status: AC
  Administered 2012-07-30: 2 g via INTRAVENOUS
  Filled 2012-07-30: qty 2

## 2012-07-30 MED ORDER — DIPHENHYDRAMINE HCL 12.5 MG/5ML PO ELIX: 12.5000 mg | ORAL_SOLUTION | Freq: Four times a day (QID) | ORAL | Status: DC | PRN

## 2012-07-30 MED ORDER — ONDANSETRON HCL 4 MG PO TABS: 4.0000 mg | ORAL_TABLET | Freq: Four times a day (QID) | ORAL | Status: DC | PRN

## 2012-07-30 MED ORDER — ONDANSETRON HCL 4 MG/2ML IJ SOLN
INTRAMUSCULAR | Status: DC | PRN
  Administered 2012-07-30 (×2): 2 mg via INTRAVENOUS

## 2012-07-30 MED ORDER — PROPOFOL 10 MG/ML IV EMUL
INTRAVENOUS | Status: DC | PRN
  Administered 2012-07-30: 25 mg via INTRAVENOUS
  Administered 2012-07-30: 175 mg via INTRAVENOUS

## 2012-07-30 MED ORDER — ACETAMINOPHEN 10 MG/ML IV SOLN
INTRAVENOUS | Status: DC | PRN
  Administered 2012-07-30: 1000 mg via INTRAVENOUS

## 2012-07-30 MED ORDER — DEXTROSE 5 % IV SOLN
1.0000 g | Freq: Four times a day (QID) | INTRAVENOUS | Status: AC
  Administered 2012-07-30 – 2012-07-31 (×2): 1 g via INTRAVENOUS
  Filled 2012-07-30 (×2): qty 1

## 2012-07-30 MED ORDER — PROMETHAZINE HCL 25 MG/ML IJ SOLN: 6.2500 mg | INTRAMUSCULAR | Status: DC | PRN

## 2012-07-30 MED ORDER — LIDOCAINE HCL (CARDIAC) 20 MG/ML IV SOLN
INTRAVENOUS | Status: DC | PRN
  Administered 2012-07-30: 20 mg via INTRAVENOUS

## 2012-07-30 MED ORDER — ALVIMOPAN 12 MG PO CAPS
12.0000 mg | ORAL_CAPSULE | Freq: Once | ORAL | Status: AC
  Administered 2012-07-30: 12 mg via ORAL
  Filled 2012-07-30: qty 1

## 2012-07-30 MED ORDER — HYDROMORPHONE HCL PF 1 MG/ML IJ SOLN
INTRAMUSCULAR | Status: DC | PRN
  Administered 2012-07-30 (×4): 0.5 mg via INTRAVENOUS

## 2012-07-30 MED ORDER — 0.9 % SODIUM CHLORIDE (POUR BTL) OPTIME
TOPICAL | Status: DC | PRN
  Administered 2012-07-30: 2000 mL

## 2012-07-30 MED ORDER — BUPIVACAINE-EPINEPHRINE PF 0.25-1:200000 % IJ SOLN
INTRAMUSCULAR | Status: AC
  Filled 2012-07-30: qty 30

## 2012-07-30 MED ORDER — CEFOXITIN SODIUM-DEXTROSE 1-4 GM-% IV SOLR (PREMIX)
INTRAVENOUS | Status: AC
  Filled 2012-07-30: qty 100

## 2012-07-30 SURGICAL SUPPLY — 93 items
ADAPTER CATH URET PLST 4-6FR (CATHETERS) ×3 IMPLANT
ADAPTER GOLDBERG URETERAL (ADAPTER) ×3 IMPLANT
APPLIER CLIP 5 13 M/L LIGAMAX5 (MISCELLANEOUS)
APPLIER CLIP ROT 10 11.4 M/L (STAPLE)
BAG URO CATCHER STRL LF (DRAPE) ×3 IMPLANT
BLADE EXTENDED COATED 6.5IN (ELECTRODE) ×3 IMPLANT
BLADE HEX COATED 2.75 (ELECTRODE) ×3 IMPLANT
BLADE SURG SZ10 CARB STEEL (BLADE) IMPLANT
CANISTER SUCT LVC 12 LTR MEDI- (MISCELLANEOUS) ×3 IMPLANT
CANISTER SUCTION 2500CC (MISCELLANEOUS) ×3 IMPLANT
CATH FOLEY 2WAY SLVR 18FR 30CC (CATHETERS) ×3 IMPLANT
CATH URET 5FR 28IN OPEN ENDED (CATHETERS) ×6 IMPLANT
CELLS DAT CNTRL 66122 CELL SVR (MISCELLANEOUS) IMPLANT
CLAMP ENDO BABCK 10MM (STAPLE) IMPLANT
CLIP APPLIE 5 13 M/L LIGAMAX5 (MISCELLANEOUS) IMPLANT
CLIP APPLIE ROT 10 11.4 M/L (STAPLE) IMPLANT
CLOTH BEACON ORANGE TIMEOUT ST (SAFETY) ×6 IMPLANT
CONNECTOR 5 IN 1 STRAIGHT STRL (MISCELLANEOUS) IMPLANT
COVER MAYO STAND STRL (DRAPES) ×3 IMPLANT
COVER SURGICAL LIGHT HANDLE (MISCELLANEOUS) ×3 IMPLANT
DECANTER SPIKE VIAL GLASS SM (MISCELLANEOUS) ×3 IMPLANT
DEVICE TROCAR PUNCTURE CLOSURE (ENDOMECHANICALS) IMPLANT
DRAPE CAMERA CLOSED 9X96 (DRAPES) ×3 IMPLANT
DRAPE LAPAROSCOPIC ABDOMINAL (DRAPES) ×3 IMPLANT
DRAPE LG THREE QUARTER DISP (DRAPES) IMPLANT
DRAPE WARM FLUID 44X44 (DRAPE) ×3 IMPLANT
DRESSING TELFA 8X3 (GAUZE/BANDAGES/DRESSINGS) ×3 IMPLANT
ELECT REM PT RETURN 9FT ADLT (ELECTROSURGICAL) ×3
ELECTRODE REM PT RTRN 9FT ADLT (ELECTROSURGICAL) ×2 IMPLANT
ENSEAL DEVICE STD TIP 35CM (ENDOMECHANICALS) IMPLANT
GLOVE BIOGEL M 8.0 STRL (GLOVE) ×3 IMPLANT
GLOVE BIOGEL PI IND STRL 7.0 (GLOVE) ×2 IMPLANT
GLOVE BIOGEL PI INDICATOR 7.0 (GLOVE) ×1
GOWN PREVENTION PLUS XLARGE (GOWN DISPOSABLE) ×3 IMPLANT
GOWN STRL NON-REIN LRG LVL3 (GOWN DISPOSABLE) ×3 IMPLANT
GOWN STRL REIN XL XLG (GOWN DISPOSABLE) ×9 IMPLANT
GUIDEWIRE STR DUAL SENSOR (WIRE) ×3 IMPLANT
HAND ACTIVATED (MISCELLANEOUS) ×3 IMPLANT
KIT BASIN OR (CUSTOM PROCEDURE TRAY) ×3 IMPLANT
LEGGING LITHOTOMY PAIR STRL (DRAPES) ×3 IMPLANT
LIGASURE IMPACT 36 18CM CVD LR (INSTRUMENTS) ×3 IMPLANT
MANIFOLD NEPTUNE II (INSTRUMENTS) ×3 IMPLANT
NS IRRIG 1000ML POUR BTL (IV SOLUTION) ×6 IMPLANT
PACK CYSTO (CUSTOM PROCEDURE TRAY) ×3 IMPLANT
PENCIL BUTTON HOLSTER BLD 10FT (ELECTRODE) ×3 IMPLANT
RTRCTR WOUND ALEXIS 18CM MED (MISCELLANEOUS)
SCALPEL HARMONIC ACE (MISCELLANEOUS) ×3 IMPLANT
SCISSORS LAP 5X35 DISP (ENDOMECHANICALS) ×3 IMPLANT
SET IRRIG TUBING LAPAROSCOPIC (IRRIGATION / IRRIGATOR) IMPLANT
SLEEVE Z-THREAD 5X100MM (TROCAR) IMPLANT
SOLUTION ANTI FOG 6CC (MISCELLANEOUS) ×3 IMPLANT
SPONGE GAUZE 4X4 12PLY (GAUZE/BANDAGES/DRESSINGS) ×3 IMPLANT
SPONGE LAP 18X18 X RAY DECT (DISPOSABLE) ×3 IMPLANT
STAPLER CIRC CVD 29MM 37CM (STAPLE) ×3 IMPLANT
STAPLER VISISTAT 35W (STAPLE) ×3 IMPLANT
STRIP CLOSURE SKIN 1/2X4 (GAUZE/BANDAGES/DRESSINGS) IMPLANT
SUCTION POOLE TIP (SUCTIONS) ×3 IMPLANT
SUT NOVA NAB DX-16 0-1 5-0 T12 (SUTURE) ×6 IMPLANT
SUT PDS AB 1 CT1 27 (SUTURE) IMPLANT
SUT PDS AB 1 CTX 36 (SUTURE) IMPLANT
SUT PDS AB 4-0 SH 27 (SUTURE) IMPLANT
SUT PROLENE 2 0 KS (SUTURE) IMPLANT
SUT PROLENE 2 0 SH DA (SUTURE) ×6 IMPLANT
SUT SILK 2 0 (SUTURE) ×1
SUT SILK 2 0 SH CR/8 (SUTURE) ×3 IMPLANT
SUT SILK 2-0 18XBRD TIE 12 (SUTURE) ×2 IMPLANT
SUT SILK 3 0 (SUTURE) ×1
SUT SILK 3 0 SH CR/8 (SUTURE) ×3 IMPLANT
SUT SILK 3-0 18XBRD TIE 12 (SUTURE) ×2 IMPLANT
SUT VIC AB 2-0 CT1 27 (SUTURE)
SUT VIC AB 2-0 CT1 27XBRD (SUTURE) IMPLANT
SUT VIC AB 2-0 SH 18 (SUTURE) ×3 IMPLANT
SUT VIC AB 2-0 SH 27 (SUTURE) ×3
SUT VIC AB 2-0 SH 27X BRD (SUTURE) ×6 IMPLANT
SUT VIC AB 3-0 PS2 18 (SUTURE)
SUT VIC AB 3-0 PS2 18XBRD (SUTURE) IMPLANT
SUT VIC AB 4-0 SH 18 (SUTURE) IMPLANT
SUT VICRYL 0 UR6 27IN ABS (SUTURE) IMPLANT
SYR 30ML LL (SYRINGE) ×3 IMPLANT
SYR BULB IRRIGATION 50ML (SYRINGE) ×3 IMPLANT
TAPE CLOTH SURG 4X10 WHT LF (GAUZE/BANDAGES/DRESSINGS) ×3 IMPLANT
TOWEL OR 17X26 10 PK STRL BLUE (TOWEL DISPOSABLE) ×3 IMPLANT
TRAY FOLEY CATH 14FRSI W/METER (CATHETERS) ×3 IMPLANT
TRAY LAP CHOLE (CUSTOM PROCEDURE TRAY) ×3 IMPLANT
TROCAR BLADELESS OPT 5 100 (ENDOMECHANICALS) ×6 IMPLANT
TROCAR HASSON GELL 12X100 (TROCAR) IMPLANT
TROCAR Z-THREAD FIOS 11X100 BL (TROCAR) IMPLANT
TROCAR Z-THREAD FIOS 5X100MM (TROCAR) ×6 IMPLANT
TROCAR Z-THREAD SLEEVE 11X100 (TROCAR) IMPLANT
TUBING CONNECTING 10 (TUBING) ×3 IMPLANT
TUBING FILTER THERMOFLATOR (ELECTROSURGICAL) ×3 IMPLANT
YANKAUER SUCT BULB TIP 10FT TU (MISCELLANEOUS) ×3 IMPLANT
YANKAUER SUCT BULB TIP NO VENT (SUCTIONS) ×3 IMPLANT

## 2012-07-30 NOTE — Progress Notes (Signed)
NOS  In step down ICU.  Doing well.  Alert with minimal pain at the current time.  Vital signs look good. Abdomen okay.  Ostomy dressing slightly stained.  Ovidio Kin, MD, [facility] Pager: [phone] Office phone:  [phone]

## 2012-07-30 NOTE — H&P (Signed)
CENTRAL  SURGERY  Ovidio Kin, MD, FACS  [address] [place], [place] [address]  Phone: [phone] FAX: [phone]   Re: [name]  DOB: April 02, 1932  MRN: [id number]   ASSESSMENT AND PLAN:  1. T4, N0 sigmoid colon cancer - has end colostomy.   Resected 04/02/2011. (Had hysterectomy at the same time)   Final path - 4.5 cm cancer, 0/40 nodes, cancer cells on the left fallopian tube.   Oncology - B. Sherrill, GI - M. Magod   She had 8 cycles of Xeloda - finished December 2012.   CEA - 2.4 - 02/18/2012.   I discussed with her about reversing her colostomy. I want to do a BE first to define her anatomy (the CT scan does not show the rectal stump well).  I reviewed with the patient colostomy reversal. I discussed the role of laparoscopic and open surgery in colon surgery. I reviewed the risks of surgery, including, but not limited to, infection, bleeding, nerve injury, anastomotic leaks, and possibility of colostomy. The biggest problem would be that I may not be able to do the reversal or that she would need a protective ostomy. She will also need ureteral stents for surgery.  I went over the preoperative mechanical and antibiotic bowel prep for colon surgery and provided prescriptions for these.   I tried to answer all questions for the patient. I told her to talk to her daughter, Bonita Quin, and if she had questions to call me.   Her daughter is running errands.  Her phone number is in the chart. Her son could not make it down.  2. 20+ pound weight gain over the last year.  3. Hypertension.  4. Hypercholesteremia.  5. Chronic back pain.   On muscle relaxants. Takes hydrocodone 2 or 3 times per week .  HISTORY OF PRESENT ILLNESS:  Chief Complaint   Patient presents with   .  Follow-up     Recheck colon    [name] is a 78 y.o. (DOB: 01-Jan-1932) whtie female who is a patient of WOLTERS,SHARON A, MD and comes to me today for follow up of  a colon resection for cancer. She has a colostomy that she wants reversed.  She is doing well. Her back pain bothers her some. She breaths with pursed lips.   She had a CT scan 04/27/2012 which was negative.  She had a colonoscopy by Dr. Ewing Schlein 06/05/2012 - he removed one polyp which proved benign.  She is by herself. Her daughter, Bonita Quin, who works at [employer] hospital had knee surgery recently. Her daughter, who had Parkinson's disease, died in 02-Dec-2011. She has a son who lives outside of [place], [place].   PHYSICAL EXAM:  BP 131/72  Pulse 78  Temp 98.8 F (37.1 C) (Oral)  Resp 20  SpO2 98%   Ht 5\' 6"  (1.676 m)  Wt 155 lb 12.8 oz (70.67 kg)  BMI 25.15 kg/m2   General: WN WF who is alert and generally healthy appearing.  HEENT: Normal. Pupils equal. Good dentition.  Neck: Supple. No mass. No thyroid mass. Carotid pulse okay with no bruit.  Lymph Nodes: No supraclavicular or cervical nodes.  Lungs: Clear to auscultation and symmetric breath sounds. She breaths with pursed lips.  Heart: RRR. No murmur or rub.  Abdomen: Soft. No mass. No tenderness. LLQ colostomy. Midline wound okay. no hernia.  Rectal: Not done.  Extremities: Good strength and ROM in upper and lower extremities.  Neurologic:  Grossly intact to motor and sensory function.  Psychiatric: Has normal mood and affect. Behavior is normal.   DATA REVIEWED:  CT scan and Dr. Marlane Hatcher colonoscopy report.   Ovidio Kin, MD, FACS  Office: [phone]

## 2012-07-30 NOTE — Transfer of Care (Signed)
Immediate Anesthesia Transfer of Care Note  Patient: [name]  Procedure(s) Performed: Procedure(s) (LRB): LAPAROSCOPIC COLOSTOMY TAKEDOWN (N/A) CYSTOSCOPY WITH STENT PLACEMENT (Bilateral)  Patient Location: PACU  Anesthesia Type: General  Level of Consciousness: awake  Airway & Oxygen Therapy: Patient Spontanous Breathing and Patient connected to face mask oxygen  Post-op Assessment: Post -op Vital signs reviewed and stable  Post vital signs: Reviewed and stable  Complications: No apparent anesthesia complications

## 2012-07-30 NOTE — Brief Op Note (Addendum)
07/30/2012  3:41 PM  PATIENT:  [name], 78 y.o., female, MRN: [id number]  PREOP DIAGNOSIS:  End sigmoid colostomy with history of colon cancer  POSTOP DIAGNOSIS:   End sigmoid colostomy with history (T4, N0) distal sigmoid colon ca, extensive SB adhesions  PROCEDURE:   Procedure(s):LAPAROSCOPIC COLOSTOMY TAKEDOWN, 29 EEA stapled anastomosis, Enterolysis x 1 hour, rigid sigmoidoscopy  CYSTOSCOPY WITH bilateral ureteral STENT PLACEMENT - M. Ottelin  SURGEON:   Ovidio Kin, M.D.  ASSISTANT:   A. Maisie Fus, M.D.  ANESTHESIA:   general  Young Berry Flynn-Cook - CRNA Valeda Malm, CRNA - CRNA Einar Pheasant, MD - Anesthesiologist Paris Lore, CRNA - CRNA  General  EBL:  < 100  ml  BLOOD ADMINISTERED: none  DRAINS: none   LOCAL MEDICATIONS USED:   30 cc 1/4% marcaine  SPECIMEN:   Colostomy and rings from EEA  COUNTS CORRECT:  YES  INDICATIONS FOR PROCEDURE:  [name] is a 78 y.o. (DOB: 10-09-1932) white female whose primary care physician is Emeterio Reeve, MD and comes for reversal of end sigmoid colostomy.   The indications and risks of the surgery were explained to the patient.  The risks include, but are not limited to, infection, bleeding, and nerve injury.  Note dictated to:   #[id number]

## 2012-07-30 NOTE — Op Note (Signed)
PATIENT:  [name]  PRE-OPERATIVE DIAGNOSIS: Sigmoid colon cancer  POST-OPERATIVE DIAGNOSIS:  Same  PROCEDURE:  Procedure(s): 1. Cystoscopy with bilateral retrograde pyelograms including interpretation. 2. Bilateral internal/external stent placement  SURGEON:  Garnett Farm  INDICATION: the patient is a 78 year old female with a history of sigmoid colon cancer. She underwent a colostomy with Hartman's pouch and was treated for colon cancer. She is returning to the operating room today for reanastomosis. Dr. Ezzard Standing has asked that I place stents to aid in identification of the ureters intraoperatively.  ANESTHESIA:  General  EBL:  Minimal  DRAINS: 6 [place] open-ended ureteral catheters in the right and left ureters and an 54 French Foley catheter in the bladder  Description of procedure: After I explained the nature of the procedure with the patient in detail including its risks and complications informed consent was obtained and she was taken to the major OR. She was placed on the table, administered general anesthesia and then moved to the dorsal lithotomy position. Her genitalia then sterilely prepped and draped and an official timeout was then performed.  The 22 French cystoscope was then passed under direct vision into the bladder. The bladder was Foley and systematically inspected and noted to be free of any tumors stones or inflammatory lesions. An old scar could be seen near the dome on the posterior wall of the bladder near the midline. The left ureteral orifice and right ureteral orifice appeared to be of normal configuration and position.  A 6 French open-ended ureteral catheter was then passed through the cystoscope and into the left ureteral orifice. Full strength contrast was then injected under direct fluoroscopic control to perform the left retrograde pyelogram. This revealed a normal appearing ureter throughout its length. The intrarenal collecting system also appear  normal. There was no deviation or abnormal variation in the course of the left ureter throughout its length. I then passed a 0.038 inch floppy tip guidewire through the open-ended catheter and into the area the renal pelvis under fluoroscopy. The 6 [place] open-ended catheter was then passed over the guidewire into the renal pelvis and the guidewire was removed. I then left a stent in place and proceeded to perform an identical procedure on the right-hand side. This also revealed no abnormality of the ureter and specifically no filling defects or mass effect. The ureter also traversed a normal course to the kidney. The intrarenal collecting system was noted to be normal as well. I then passed the guidewire and open-ended catheter removing the guidewire and leaving the open-ended catheter in place. The bladder was then drained and the cystoscope was removed. Both of the internal/external catheters were then connected to closed system drainage using a Goldberg connector I then placed an 18 French Foley catheter in the bladder and connected this to the [place] connector as well and all were connected to closed system drainage.  The patient will undergo her planned procedure by Dr. Ezzard Standing and at the end of the procedure the internal/external stents will be removed by him. She tolerated this portion of the procedure well with no intraoperative complications.

## 2012-07-30 NOTE — Anesthesia Preprocedure Evaluation (Addendum)
Anesthesia Evaluation  Patient identified by MRN, date of birth, ID band Patient awake    Reviewed: Allergy & Precautions, H&P , NPO status , Patient's Chart, lab work & pertinent test results  Airway Mallampati: II TM Distance: >3 FB Neck ROM: Full    Dental  (+) Dental Advisory Given   Pulmonary neg pulmonary ROS,  breath sounds clear to auscultation  Pulmonary exam normal       Cardiovascular hypertension, Pt. on medications Rhythm:Regular Rate:Normal     Neuro/Psych negative neurological ROS  negative psych ROS   GI/Hepatic negative GI ROS, Neg liver ROS, Colon neoplasm   Endo/Other  negative endocrine ROS  Renal/GU negative Renal ROS  negative genitourinary   Musculoskeletal negative musculoskeletal ROS (+)   Abdominal   Peds  Hematology negative hematology ROS (+)   Anesthesia Other Findings   Reproductive/Obstetrics negative OB ROS                           Anesthesia Physical Anesthesia Plan  ASA: III  Anesthesia Plan: General   Post-op Pain Management:    Induction: Intravenous  Airway Management Planned: Oral ETT  Additional Equipment:   Intra-op Plan:   Post-operative Plan: Extubation in OR  Informed Consent: I have reviewed the patients History and Physical, chart, labs and discussed the procedure including the risks, benefits and alternatives for the proposed anesthesia with the patient or authorized representative who has indicated his/her understanding and acceptance.   Dental advisory given  Plan Discussed with: CRNA  Anesthesia Plan Comments:         Anesthesia Quick Evaluation

## 2012-07-30 NOTE — Anesthesia Postprocedure Evaluation (Signed)
  Anesthesia Post-op Note  Patient: [name]  Procedure(s) Performed: Procedure(s) (LRB): LAPAROSCOPIC COLOSTOMY TAKEDOWN (N/A) CYSTOSCOPY WITH STENT PLACEMENT (Bilateral)  Patient Location: PACU  Anesthesia Type: General  Level of Consciousness: awake and alert   Airway and Oxygen Therapy: Patient Spontanous Breathing  Post-op Pain: mild  Post-op Assessment: Post-op Vital signs reviewed, Patient's Cardiovascular Status Stable, Respiratory Function Stable, Patent Airway and No signs of Nausea or vomiting  Post-op Vital Signs: stable  Complications: No apparent anesthesia complications

## 2012-07-31 ENCOUNTER — Encounter (HOSPITAL_COMMUNITY): Payer: Self-pay | Admitting: Surgery

## 2012-07-31 LAB — CBC
HCT: 35.2 % — ABNORMAL LOW (ref 36.0–46.0)
Hemoglobin: 11.9 g/dL — ABNORMAL LOW (ref 12.0–15.0)
MCH: 31 pg (ref 26.0–34.0)
MCHC: 33.8 g/dL (ref 30.0–36.0)
RDW: 12.7 % (ref 11.5–15.5)

## 2012-07-31 LAB — BASIC METABOLIC PANEL
BUN: 11 mg/dL (ref 6–23)
Calcium: 7.8 mg/dL — ABNORMAL LOW (ref 8.4–10.5)
GFR calc Af Amer: >90 mL/min (ref >90)
GFR calc non Af Amer: 80 mL/min — ABNORMAL LOW (ref >90)
Glucose, Bld: 105 mg/dL — ABNORMAL HIGH (ref 70–99)
Potassium: 3.8 mEq/L (ref 3.5–5.1)
Sodium: 136 mEq/L (ref 135–145)

## 2012-07-31 MED ORDER — PNEUMOCOCCAL VAC POLYVALENT 25 MCG/0.5ML IJ INJ
0.5000 mL | INJECTION | INTRAMUSCULAR | Status: AC | PRN
  Administered 2012-08-04: 0.5 mL via INTRAMUSCULAR
  Filled 2012-07-31: qty 0.5

## 2012-07-31 NOTE — Progress Notes (Signed)
CARE MANAGEMENT NOTE 07/31/2012  Patient:  [name], [name]   Account Number:  [id number]  Date Initiated:  07/31/2012  Documentation initiated by:  DAVIS,RHONDA  Subjective/Objective Assessment:   pt had colostomy take down on 08222013/has history of htn and cardiac problems, in sdu overnight for monitoring.     Action/Plan:   from home,   Anticipated DC Date:  08/01/2012   Anticipated DC Plan:  HOME/SELF CARE  In-house referral  NA      DC Planning Services  NA      [facility] Choice  NA   Choice offered to / List presented to:  NA   DME arranged  NA      DME agency  NA     HH arranged  NA      HH agency  NA   Status of service:  In process, will continue to follow Medicare Important Message given?  NA - LOS <3 / Initial given by admissions (If response is "NO", the following Medicare IM given date fields will be blank) Date Medicare IM given:   Date Additional Medicare IM given:    Discharge Disposition:    Per UR Regulation:  Reviewed for med. necessity/level of care/duration of stay  If discussed at Long Length of Stay Meetings, dates discussed:    Comments:  08222013/Rhonda Earlene Plater, RN, BSN, CCM: CHART REVIEWED AND UPDATED. NO DISCHARGE NEEDS PRESENT AT THIS TIME. CASE MANAGEMENT [phone]

## 2012-07-31 NOTE — Op Note (Signed)
NAMEMARISEL, TOSTENSON           ACCOUNT NO.:  [id number]  MEDICAL RECORD NO.:  [id number]  LOCATION:  1234                         FACILITY:  [facility]  PHYSICIAN:  Sandria Bales. Ezzard Standing, M.D.  DATE OF BIRTH:  December 22, 1931  DATE OF PROCEDURE:  07/30/2012                              OPERATIVE REPORT  PREOPERATIVE DIAGNOSIS:  End sigmoid colostomy, history of distal sigmoid colon cancer (T4, N0).  POSTOPERATIVE DIAGNOSIS:  End sigmoid colostomy with Gertie Gowda pouch,history of distal sigmoid colon carcinoma (T4, N0), extensive small bowel adhesions.  PROCEDURE:  Laparoscopic colostomy reversal with a 29 EEA stapled anastomosis, and 1 hour enterolysis of adhesions, rigid sigmoidoscopy.   (Photos in chart)  She also had a cystoscopy with bilateral ureteral stent placement by Dr. Ihor Gully.  SURGEON:  Sandria Bales. Ezzard Standing, MD  FIRST ASSISTANT:  Dr. Bunnie Domino.  ANESTHESIA:  General, supervised by Dr. Dr. Lucille Passy.  ESTIMATED BLOOD LOSS:  Less than 100 mL.  LOCAL ANESTHETIC USED:  30 mL of 0.25% Marcaine.  COMPLICATIONS:  None.  INDICATION FOR PROCEDURE:  Ms. Locascio is a 78 year old white female who sees Dr. Mila Palmer who is her primary care doctor.  She presented with a distal sigmoid colon cancer which I resected in April of 2012.  Unfortunately, she had a local perforation which required a hysterectomy and left oophorectomy at the same time.  She did have a foci of cancer cells on the left fallopian tube, and she has undergone 8 cycles of Xeloda supervised by Dr. Mancel Bale.  Her last CEA on July 24, 2012, was 2.8 in the normal range.  She has had a CT scan, which shows no recurrent disease and now comes for attempted reversal of her colostomy.  I spent a long time talking about reversing the colostomy that she is an older woman, this is a higher risk procedure, but she was still interested in proceeding with colostomy reversal.  Because of abscess, an extensive  dissection on her last surgery Dr. Loraine Leriche placed bilateral ureteral stents for me.  OPERATIVE NOTE:  She was in room #10 at [facility].  General anesthesia supervised Dr. Lucille Passy.  Dr. Loraine Leriche did a cystoscopy with bilateral ureteral stent placement at the beginning of the procedure.  So her stents were in place.  She was in lithotomy position.  Had both her arms tucked by her side.  Her abdomen was prepped with ChloraPrep and perineum prepped with Betadine and she was  sterilely dressed.  A time-out was held and surgical checklist run.    She will be given cefoxitin as her initial antibiotic, had PAS stockings in place.  I sewed her LLQ colostomy closed.  I accessed her abdominal cavity through the right upper quadrant with a 5 mm Optiview and placed 3 additional Optiview trocars, one in right lower quadrant, one in left lower quadrant, one in the midline scar.  I spent between an hour to an hour and a half taking down adhesions first intra-abdominally around the colostomy and then down in her pelvis from her prior surgery.  Even though, she had a lot of adhesions they were filmy which was tedious getting them taken down.  I did almost  all the enterolysis with scissors.  I then was able to reduce the small bowel out of the pelvis.  I had taken the adhesions down around the colostomy which went to the left lower quadrant.  I could see my 2 "marking" sutures at the proximal rectal stump.  At this point, Dr. Maisie Fus scrubbed out and gave me retraction for the rectum/vagina.  She put an EEA sizing probe in the rectum and an EEA Sizing probe in the vagina to identify the structures and give me something to operate against.  I cut the peritoneal floor around the rectal stump and developed about a 3-4 cm length of the proximal rectum above the peritoneal reflection.  I thought I was well away from the vagina since she had a sizing probe in the vagina and it looked like she had an  adequate stump for an EEA anastomosis.  At this point, I took down her colostomy and took it down from the anterior abdominal wall.  I cut off the distal 5 cm of the colostomy stump.  I did a whip stitch of 2-0 Prolene around the colotomy.  I then sized the colon, first with a 25 sizer probe, and then 29.  It appeared that a 29 fit.  I tied the prolene around the distal anvil.  I dropped the anvil/colon stump into the abdominal cavity and I closed her left lower quadrant colostomy site. It was closed with a single layer #1 Novafil sutures interrupted.  I then re-insufflated the abdomen.  Dr. Maisie Fus inserted the #29 EEA stapler through the rectum.  It looked like the point of EEA came out through the distal stump.  In an appropriate location, I attached it to the sigmoid colon anvil, she closed the EEA down and fired it and got 2 intact rings, both the proximal and distal colon. These were sent to pathology also because of her history of cancer.  I clamped off this to the colon, Dr. Maisie Fus then insufflated the distal rectum with air.  There was no leak, she saw a patent anastomosis.  I did sew a couple of holding sutures of 2-0 Vicryl to buttress the EEA anastomosis, one on each side.  Laparoscopically I took down some of the left colon to take tension off the anastomosis.  I went up towards the splenic flexure (I had released this at the last operation)  There was not much or any tension on the colorectal anastomosis.  I then irrigated with warm saline with about a liter, I reinspected the small bowel, which I did not see any injuries.  I did try to bring the omentum down which went about 2/3rd the way down the anterior abdominal cavity.  The rest of her abdominal cavity was unremarkable.  Both lobes of liver unremarkable without any evidence of metastatic disease.  I did not put an NG tube and we will keep her n.p.o. at least for a day or two.    She tolerated the procedure well.  The trocars  had been removed. I irrigated the ostomy site, closed it with the staples, and then as I did the 4 trocar sites.  I did infiltrate about 30 mL of 0.25% Marcaine. I did place a single wick into the ostomy wound, sterilely dressed these.  The ureteral stents were removed at the end of the case.  The sponge and needle count were correct at the end of the case.  She was transferred to recovery room, we will put in  step-down ICU overnight for observation.   Sandria Bales. Ezzard Standing, M.D., FACS   DHN/MEDQ  D:  07/30/2012  T:  07/31/2012  Job:  [id number]  cc:   Ladene Artist, M.D. Fax: [phone]  Emeterio Reeve, MD Fax: [phone]  Petra Kuba, M.D. Fax: [phone]

## 2012-07-31 NOTE — Progress Notes (Signed)
General Surgery Note  LOS: 1 day  POD# 1  Assessment/Plan: 1.  Laparoscopic colostomy reversal with a 29 EEA stapled anastomosis, and enterolysis of adhesions, rigid sigmoidoscopy. - D. Breven Guidroz - 07/30/2012  In step down.  Has done well.  Will transfer to floor.  2. Cystoscopy with bilateral ureteral stent placement by Dr.  Loraine Leriche. 3. Hypertension.  4. Hypercholesteremia.  5. Chronic back pain.   On muscle relaxants. 6.  DVT proph - SQ heparin   Subjective:  Doing well.  Alarm is bothering her. Objective:   Filed Vitals:   07/31/12 0000  BP: 130/33  Pulse: 63  Temp: 97.6 F (36.4 C)  Resp: 13     Intake/Output from previous day:  08/22 0701 - 08/23 0700 In: 4100 [I.V.:3850; IV Piggyback:250] Out: 1315 [Urine:1235; Blood:80]  Intake/Output this shift:      Physical Exam:   General: WN older WF who is alert and oriented.    HEENT: Normal. Pupils equal. .   Lungs: Clear.  IS at 1500cc   Abdomen: Mild distention.  No BS.   Wound: Ostomy dressing stained.   Neurologic:  Grossly intact to motor and sensory function.   Psychiatric: Has normal mood and affect.   Lab Results:    [facility] 07/31/12 0320  WBC 8.3  HGB 11.9*  HCT 35.2*  PLT 136*    BMET   Basename 07/31/12 0320  NA 136  K 3.8  CL 104  CO2 25  GLUCOSE 105*  BUN 11  CREATININE 0.72  CALCIUM 7.8*    PT/INR  No results found for this basename: LABPROT:2,INR:2 in the last 72 hours  ABG  No results found for this basename: PHART:2,PCO2:2,PO2:2,HCO3:2 in the last 72 hours   Studies/Results:  Dg C-arm 1-60 Min-no Report  07/30/2012  CLINICAL DATA: Cysto   C-ARM 1-60 MINUTES  Fluoroscopy was utilized by the requesting physician.  No radiographic  interpretation.       Anti-infectives:   Anti-infectives     Start     Dose/Rate Route Frequency Ordered Stop   07/30/12 1900   cefOXitin (MEFOXIN) 1 g in dextrose 5 % 50 mL IVPB        1 g 100 mL/hr over 30 Minutes Intravenous Every 6 hours 07/30/12  1800 07/31/12 0253   07/30/12 1800   cefOXitin (MEFOXIN) 2 g in dextrose 5 % 50 mL IVPB  Status:  Discontinued        2 g 100 mL/hr over 30 Minutes Intravenous 60 min pre-op 07/30/12 1800 07/30/12 1806   07/30/12 0754   cefOXitin (MEFOXIN) 2 g in dextrose 5 % 50 mL IVPB        2 g 100 mL/hr over 30 Minutes Intravenous 60 min pre-op 07/30/12 0754 07/30/12 1130          Ovidio Kin, MD, FACS Pager: [phone],   [facility] [place] [facility] Office: [phone] 07/31/2012

## 2012-08-01 LAB — BASIC METABOLIC PANEL
Calcium: 8.3 mg/dL — ABNORMAL LOW (ref 8.4–10.5)
GFR calc Af Amer: >90 mL/min (ref >90)
GFR calc non Af Amer: 81 mL/min — ABNORMAL LOW (ref >90)
Potassium: 4.1 mEq/L (ref 3.5–5.1)
Sodium: 139 mEq/L (ref 135–145)

## 2012-08-01 LAB — CBC
MCH: 30.8 pg (ref 26.0–34.0)
MCHC: 32.6 g/dL (ref 30.0–36.0)
Platelets: 150 10*3/uL (ref 150–400)

## 2012-08-01 NOTE — Progress Notes (Signed)
Patient ID: [name], female   DOB: February 23, 1932, 78 y.o.   MRN: [id number] 2 Days Post-Op  Subjective: No complaints this morning. Just sore. No nausea. No flatus or bowel movements. She has been up and ambulating.  Objective: Vital signs in last 24 hours: Temp:  [97.5 F (36.4 C)-99.1 F (37.3 C)] 99.1 F (37.3 C) (08/24 1016) Pulse Rate:  [67-89] 70  (08/24 1016) Resp:  [10-21] 16  (08/24 1151) BP: (126-172)/(61-78) 172/61 mmHg (08/24 1016) SpO2:  [93 %-100 %] 98 % (08/24 1151) Last BM Date:  (prior to admission)  Intake/Output from previous day: 08/23 0701 - 08/24 0700 In: 1815 [P.O.:240; I.V.:1375; IV Piggyback:200] Out: 3950 [Urine:3950] Intake/Output this shift: Total I/O In: 500 [I.V.:500] Out: -   General appearance: alert and no distress Resp: clear to auscultation bilaterally GI: appropriate incisional tenderness Incision/Wound: dressings clean and dry  Lab Results:   [facility] 08/01/12 0436 07/31/12 0320  WBC 6.9 8.3  HGB 12.3 11.9*  HCT 37.7 35.2*  PLT 150 136*   BMET  Basename 08/01/12 0436 07/31/12 0320  NA 139 136  K 4.1 3.8  CL 106 104  CO2 27 25  GLUCOSE 111* 105*  BUN 6 11  CREATININE 0.69 0.72  CALCIUM 8.3* 7.8*     Studies/Results: No results found.  Anti-infectives: Anti-infectives     Start     Dose/Rate Route Frequency Ordered Stop   07/30/12 1900   cefOXitin (MEFOXIN) 1 g in dextrose 5 % 50 mL IVPB        1 g 100 mL/hr over 30 Minutes Intravenous Every 6 hours 07/30/12 1800 07/31/12 0253   07/30/12 1800   cefOXitin (MEFOXIN) 2 g in dextrose 5 % 50 mL IVPB  Status:  Discontinued        2 g 100 mL/hr over 30 Minutes Intravenous 60 min pre-op 07/30/12 1800 07/30/12 1806   07/30/12 0754   cefOXitin (MEFOXIN) 2 g in dextrose 5 % 50 mL IVPB        2 g 100 mL/hr over 30 Minutes Intravenous 60 min pre-op 07/30/12 0754 07/30/12 1130          Assessment/Plan: s/p Procedure(s): LAPAROSCOPIC COLOSTOMY  TAKEDOWN CYSTOSCOPY WITH STENT PLACEMENT Doing well postoperatively. Will start clear liquid diet.   LOS: 2 days    Evanny Ellerbe T 08/01/2012

## 2012-08-02 MED ORDER — LISINOPRIL 10 MG PO TABS
10.0000 mg | ORAL_TABLET | Freq: Every day | ORAL | Status: DC
  Administered 2012-08-02 – 2012-08-04 (×3): 10 mg via ORAL
  Filled 2012-08-02 (×3): qty 1

## 2012-08-02 MED ORDER — MORPHINE SULFATE 2 MG/ML IJ SOLN: 1.0000 mg | INTRAMUSCULAR | Status: DC | PRN

## 2012-08-02 MED ORDER — HYDROCODONE-ACETAMINOPHEN 5-325 MG PO TABS
1.0000 | ORAL_TABLET | ORAL | Status: DC | PRN
  Administered 2012-08-02 – 2012-08-04 (×7): 1 via ORAL
  Filled 2012-08-02 (×7): qty 1

## 2012-08-02 NOTE — Progress Notes (Signed)
Spoke to Dr. Biagio Quint about patient trend in b/p and that patient takes lisinopril at home, orders given. Asked pertaining to foley catheter, patient had uretal stents placed in OR, MD agreeable to leaving physician sticky note for attending pertaining to removing foley tomorrow. Also discussed that patient concerned she had some bloody drainage from rectum and that i replaced gauze to rectum old had serosanguinous drainage, MD agrees this is normal postop.

## 2012-08-02 NOTE — Progress Notes (Signed)
3 Days Post-Op  Subjective: No issues.  Tolerating clears but no flatus or BM  Objective: Vital signs in last 24 hours: Temp:  [97.4 F (36.3 C)-99.9 F (37.7 C)] 97.4 F (36.3 C) (08/25 0537) Pulse Rate:  [67-85] 67  (08/25 0537) Resp:  [12-20] 19  (08/25 0842) BP: (124-169)/(69-77) 148/76 mmHg (08/25 0537) SpO2:  [41 %-99 %] 95 % (08/25 0842) Last BM Date:  (prior to admission)  Intake/Output from previous day: 08/24 0701 - 08/25 0700 In: 2471.7 [P.O.:360; I.V.:2111.7] Out: 3025 [Urine:3025] Intake/Output this shift:    General appearance: alert, cooperative and no distress Resp: nonlabored Cardio: normal rate, regular GI: soft, minimal tenderness, ND, wick removed, dressing changed, no infection, no peritoneal signs  Lab Results:   [facility] 08/01/12 0436 07/31/12 0320  WBC 6.9 8.3  HGB 12.3 11.9*  HCT 37.7 35.2*  PLT 150 136*   BMET  Basename 08/01/12 0436 07/31/12 0320  NA 139 136  K 4.1 3.8  CL 106 104  CO2 27 25  GLUCOSE 111* 105*  BUN 6 11  CREATININE 0.69 0.72  CALCIUM 8.3* 7.8*   PT/INR No results found for this basename: LABPROT:2,INR:2 in the last 72 hours ABG No results found for this basename: PHART:2,PCO2:2,PO2:2,HCO3:2 in the last 72 hours  Studies/Results: No results found.  Anti-infectives: Anti-infectives     Start     Dose/Rate Route Frequency Ordered Stop   07/30/12 1900   cefOXitin (MEFOXIN) 1 g in dextrose 5 % 50 mL IVPB        1 g 100 mL/hr over 30 Minutes Intravenous Every 6 hours 07/30/12 1800 07/31/12 0253   07/30/12 1800   cefOXitin (MEFOXIN) 2 g in dextrose 5 % 50 mL IVPB  Status:  Discontinued        2 g 100 mL/hr over 30 Minutes Intravenous 60 min pre-op 07/30/12 1800 07/30/12 1806   07/30/12 0754   cefOXitin (MEFOXIN) 2 g in dextrose 5 % 50 mL IVPB        2 g 100 mL/hr over 30 Minutes Intravenous 60 min pre-op 07/30/12 0754 07/30/12 1130          Assessment/Plan: s/p Procedure(s) (LRB): LAPAROSCOPIC COLOSTOMY  TAKEDOWN (N/A) CYSTOSCOPY WITH STENT PLACEMENT (Bilateral) she looks good today.  Tolerating liquids.  No nausea.  Will try oral pain meds and d/c IV fluids and PCA.  Awaiting return of bowel function prior to advancing diet.  LOS: 3 days    Lodema Pilot DAVID 08/02/2012

## 2012-08-03 NOTE — Progress Notes (Signed)
General Surgery Note  LOS: 4 days  POD# 4  Assessment/Plan: 1.  Laparoscopic colostomy reversal with a 29 EEA stapled anastomosis, and enterolysis of adhesions, rigid sigmoidoscopy. - D. Katrine Radich - 07/30/2012  Had good weekend.  Passing flatus.  To advance to reg diet  2. Cystoscopy with bilateral ureteral stent placement by Dr.  Loraine Leriche.  For some reason the foley is still in.  This was supposed to have been removed when she left the unit on Friday. 3. Hypertension.  4. Hypercholesteremia.  5. Chronic back pain.   On muscle relaxants. 6.  DVT proph - SQ heparin   Subjective:  Had good weekend.  Passing flatus.  Getting up and moving around well. Objective:   Filed Vitals:   08/03/12 0620  BP: 128/66  Pulse: 50  Temp: 97.3 F (36.3 C)  Resp: 18     Intake/Output from previous day:  08/25 0701 - 08/26 0700 In: 1077.5 [P.O.:720; I.V.:357.5] Out: 2625 [Urine:2625]  Intake/Output this shift:      Physical Exam:   General: WN older WF who is alert and oriented.    HEENT: Normal. Pupils equal. .   Lungs: Clear.     Abdomen: Soft.  Few BS.   Wound: wounds look good.   Neurologic:  Grossly intact to motor and sensory function.   Psychiatric: Has normal mood and affect.   Lab Results:     [facility] 08/01/12 0436  WBC 6.9  HGB 12.3  HCT 37.7  PLT 150    BMET    Basename 08/01/12 0436  NA 139  K 4.1  CL 106  CO2 27  GLUCOSE 111*  BUN 6  CREATININE 0.69  CALCIUM 8.3*    PT/INR  No results found for this basename: LABPROT:2,INR:2 in the last 72 hours  ABG  No results found for this basename: PHART:2,PCO2:2,PO2:2,HCO3:2 in the last 72 hours   Studies/Results:  No results found.   Anti-infectives:   Anti-infectives     Start     Dose/Rate Route Frequency Ordered Stop   07/30/12 1900   cefOXitin (MEFOXIN) 1 g in dextrose 5 % 50 mL IVPB        1 g 100 mL/hr over 30 Minutes Intravenous Every 6 hours 07/30/12 1800 07/31/12 0253   07/30/12 1800   cefOXitin  (MEFOXIN) 2 g in dextrose 5 % 50 mL IVPB  Status:  Discontinued        2 g 100 mL/hr over 30 Minutes Intravenous 60 min pre-op 07/30/12 1800 07/30/12 1806   07/30/12 0754   cefOXitin (MEFOXIN) 2 g in dextrose 5 % 50 mL IVPB        2 g 100 mL/hr over 30 Minutes Intravenous 60 min pre-op 07/30/12 0754 07/30/12 1130          Ovidio Kin, MD, FACS Pager: [phone],   [facility] Office: [phone] 08/03/2012

## 2012-08-03 NOTE — Care Management Note (Signed)
    Page 1 of 2   08/03/2012     12:07:06 PM   CARE MANAGEMENT NOTE 08/03/2012  Patient:  [name], [name]   Account Number:  [id number]  Date Initiated:  07/31/2012  Documentation initiated by:  DAVIS,RHONDA  Subjective/Objective Assessment:   pt had colostomy take down on 08222013/has history of htn and cardiac problems, in sdu overnight for monitoring.     Action/Plan:   from home,   Anticipated DC Date:  08/01/2012   Anticipated DC Plan:  HOME/SELF CARE  In-house referral  NA      DC Planning Services  CM consult      PAC Choice  NA   Choice offered to / List presented to:  NA   DME arranged  NA      DME agency  NA     HH arranged  NA      HH agency  NA   Status of service:  Completed, signed off Medicare Important Message given?  NA - LOS <3 / Initial given by admissions (If response is "NO", the following Medicare IM given date fields will be blank) Date Medicare IM given:   Date Additional Medicare IM given:    Discharge Disposition:  HOME/SELF CARE  Per UR Regulation:  Reviewed for med. necessity/level of care/duration of stay  If discussed at Long Length of Stay Meetings, dates discussed:    Comments:  08222013/Rhonda Earlene Plater, RN, BSN, CCM: CHART REVIEWED AND UPDATED. NO DISCHARGE NEEDS PRESENT AT THIS TIME. CASE MANAGEMENT [phone]

## 2012-08-04 MED ORDER — HYDROCODONE-ACETAMINOPHEN 5-500 MG PO TABS: 1.0000 | ORAL_TABLET | Freq: Three times a day (TID) | ORAL | Status: DC | PRN

## 2012-08-04 NOTE — Progress Notes (Addendum)
General Surgery Note  LOS: 5 days  POD# 5  Assessment/Plan: 1.  Laparoscopic colostomy reversal with a 29 EEA stapled anastomosis, and enterolysis of adhesions, rigid sigmoidoscopy. - D. Reinhardt Licausi - 07/30/2012  Taking reg diet.  Had BM.  Ready for discharge.  Instructions reviewed.  D/C dictation # [id number].  2. Cystoscopy with bilateral ureteral stent placement by Dr.  Ihor Gully.  Tolerated foley out. 3. Hypertension.  4. Hypercholesteremia.  5. Chronic back pain.   On muscle relaxants. 6.  DVT proph - SQ heparin   Subjective:  Doing well.  Looks good.  Ready for d/c. Objective:   Filed Vitals:   08/04/12 0630  BP: 160/81  Pulse: 65  Temp: 97.6 F (36.4 C)  Resp: 16     Intake/Output from previous day:  08/26 0701 - 08/27 0700 In: 720 [P.O.:720] Out: 400 [Urine:400]  Intake/Output this shift:      Physical Exam:   General: WN older WF who is alert and oriented.    HEENT: Normal. Pupils equal. .   Lungs: Clear.     Abdomen: Soft.  BS normal.   Wound: Wounds look good.  Will remove single staples.   Neurologic:  Grossly intact to motor and sensory function.   Psychiatric: Has normal mood and affect.   Lab Results:    No results found for this basename: WBC:2,HGB:2,HCT:2,PLT:2 in the last 72 hours  BMET   No results found for this basename: NA:2,K:2,CL:2,CO2:2,GLUCOSE:2,BUN:2,CREATININE:2,CALCIUM:2 in the last 72 hours  PT/INR  No results found for this basename: LABPROT:2,INR:2 in the last 72 hours  ABG  No results found for this basename: PHART:2,PCO2:2,PO2:2,HCO3:2 in the last 72 hours   Studies/Results:  No results found.   Anti-infectives:   Anti-infectives     Start     Dose/Rate Route Frequency Ordered Stop   07/30/12 1900   cefOXitin (MEFOXIN) 1 g in dextrose 5 % 50 mL IVPB        1 g 100 mL/hr over 30 Minutes Intravenous Every 6 hours 07/30/12 1800 07/31/12 0253   07/30/12 1800   cefOXitin (MEFOXIN) 2 g in dextrose 5 % 50 mL IVPB  Status:   Discontinued        2 g 100 mL/hr over 30 Minutes Intravenous 60 min pre-op 07/30/12 1800 07/30/12 1806   07/30/12 0754   cefOXitin (MEFOXIN) 2 g in dextrose 5 % 50 mL IVPB        2 g 100 mL/hr over 30 Minutes Intravenous 60 min pre-op 07/30/12 0754 07/30/12 1130          Ovidio Kin, MD, FACS Pager: [phone],   [facility]: [phone] 08/04/2012

## 2012-08-05 NOTE — Discharge Summary (Signed)
[name], [name]           ACCOUNT NO.:  [id number]  MEDICAL RECORD NO.:  [id number]  LOCATION:  1526                         FACILITY:  [facility]  PHYSICIAN:  Sandria Bales. Ezzard Standing, M.D.  DATE OF BIRTH:  March 10, 1932  DATE OF ADMISSION:  07/30/2012 DATE OF DISCHARGE:  08/04/2012                              DISCHARGE SUMMARY   DISCHARGE DIAGNOSES: 1. End colostomy, which has been reversed. 2. T4, N0 sigmoid colon carcinoma with no evidence of cancer at the     time of exploration. 3. Hypertension. 4. Hypercholesterolemia. 5. Chronic back pain.  OPERATION PERFORMED:  The patient underwent a laparoscopic reversal of her colostomy with a 29 EEA staple anastomosis, enterolysis of adhesions, and rigid sigmoidoscopy by Dr. Ovidio Kin on July 30, 2012. She had bilateral ureteral stents placed the same day by Dr. Raylene Miyamoto.  HISTORY OF ILLNESS:  [name] is a 78 year old white female who sees Dr. Mila Palmer.  She was hospitalized in [facility] in January of 2012 for diverticular abscess which was percutaneously drained. However, in working up this, she had a colonoscopy by Dr. Vida Rigger who found a tumor in her distal sigmoid colon.  I did a sigmoid colectomy with end colostomy, and appendectomy, and repair of a cystotomy from April 02, 2011.  She also had a hysterectomy and left salpingo- oophorectomy by Dr. Jaymes Graff on April 02, 2011.  She was found to have a focal perforation of her colon cancer and underwent treatment by Dr. Mancel Bale.  I wanted to wait a full year from her surgery before trying to reverse her for 2 reasons: 1. To make sure she regain her strength. 2. To make sure there is no evidence of recurrent tumor.  A CT scan done on May 02, 2012 was negative.  She had a colonoscopy by Dr. Berton Lan on June 05, 2012 in which she found 1 polyp which proved to be benign.  Her CEA on July 24, 2012 was 2.8, and she then came for reversal of this end  colostomy.  HOSPITAL COURSE:  On July 30, 2012, she was taken to the [facility] operating room, where I did a laparoscopic reversal of her end colostomy.  She also had to do extensive enterolysis of adhesions, but she had no gross evidence of any recurrent colon cancer.  Dr. Raylene Miyamoto placed bilateral ureteral stents for me preoperatively.  I did send off the ostomy and the readings from the anastomosis, and there was no evidence of malignancy in this specimen.  For postoperative course, I did put her in the step-down ICU for 1 night.  She was kept on subcu heparin prophylactically for DVT postop.   By the third postoperative day, she had started passing gas and started on sips.  I removed her Foley on August 03, 2012.  She is doing well, urinating.  She is now having bowel movements.  She had been advanced to a regular diet and tolerating this well.  She is now ready for discharge.   She is given Vicodin for pain.   She should eat a regular diet.   She should not do heavy lifting for 1 week and may start driving in 4-5  days, if doing well.   Several small staples will be removed before discharge.   For her bigger incision, I think it is safe to remove in a week or two.   I will see her back in the office. She knows to call me for any interval problems.   Sandria Bales. Ezzard Standing, M.D., FACS   DHN/MEDQ  D:  08/04/2012  T:  08/05/2012  Job:  [id number]  cc:   Emeterio Reeve, MD Fax: [phone]  Ladene Artist, M.D. Fax: [phone]  Petra Kuba, M.D. Fax: [phone]  Veverly Fells. Vernie Ammons, M.D. Fax: [phone]  Naima A. Normand Sloop, M.D. Fax: [phone]

## 2012-08-18 ENCOUNTER — Ambulatory Visit (INDEPENDENT_AMBULATORY_CARE_PROVIDER_SITE_OTHER): Payer: Medicare Other | Admitting: Surgery

## 2012-08-18 ENCOUNTER — Encounter (INDEPENDENT_AMBULATORY_CARE_PROVIDER_SITE_OTHER): Payer: Self-pay | Admitting: Surgery

## 2012-08-18 VITALS — BP 122/78 | HR 84 | Temp 97.8°F | Resp 18 | Ht 66.0 in | Wt 155.0 lb

## 2012-08-18 DIAGNOSIS — Z85038 Personal history of other malignant neoplasm of large intestine: Secondary | ICD-10-CM

## 2012-08-18 NOTE — Progress Notes (Signed)
[facility]  Ovidio Kin, MD,  FACS [address]  [address] [place], [place]    [address] Phone:  [phone] FAX:  [phone]   Re:   [name] DOB:   22-Dec-1931 MRN:   [id number]  ASSESSMENT AND PLAN: 1.  T4, N0 sigmoid colon cancer.  Resected 04/02/2011.  (Had hysterectomy at the same time)  Final path - 4.5 cm cancer, 0/40 nodes, cancer cells on the left fallopian tube.  Oncology - B. Sherrill, GI - M. Magod  She had 8 cycles of Xeloda - finished December 2012.  CEA - 2.4 - 02/18/2012.  I did her colostomy reversal 07/30/2012.  She has done very well.  I made her return appt in 6 months.  2.  20+ pound weight gain over the last year. 3.  Hypertension. 4.  Hypercholesteremia. 5.  Chronic back pain.    On muscle relaxants.  Takes hydrocodone 2 or 3 times per week.  HISTORY OF PRESENT ILLNESS: Chief Complaint  Patient presents with  . Routine Post Op    [name] is a 78 y.o. (DOB: [birth year])  whtie female who is a patient of WOLTERS,SHARON A, MD and comes to me today for follow up of a colostomy reversal.  She is eating well.  Takes occasional Miralax.  Has no complaint.  She is doing well.  She did better with surgery than I would have ever expected.  She had a colonoscopy by Dr. Ewing Schlein 06/05/2012 - he removed one polyp which proved benign.  Social History: Her daughter, Bonita Quin, who works at [employer] hospital had knee surgery 2013. Her daughter, who had Parkinson's disease, died in 11-18-11.  She has a son who lives outside of [place], [place].  PHYSICAL EXAM: BP 122/78  Pulse 84  Temp 97.8 F (36.6 C) (Oral)  Resp 18  Ht 5\' 6"  (1.676 m)  Wt 155 lb (70.308 kg)  BMI 25.02 kg/m2  General: WN WF who is alert and generally healthy appearing.   Abdomen: Soft. No mass. No tenderness. LLQ colostomy incision looks good.  Staples out.  DATA REVIEWED: Path report to the patient.  Ovidio Kin, MD, FACS Office:   [phone]

## 2012-08-21 ENCOUNTER — Encounter: Payer: Self-pay | Admitting: *Deleted

## 2012-08-24 ENCOUNTER — Encounter (INDEPENDENT_AMBULATORY_CARE_PROVIDER_SITE_OTHER): Payer: Self-pay

## 2012-11-19 ENCOUNTER — Telehealth: Payer: Self-pay | Admitting: Oncology

## 2012-11-19 ENCOUNTER — Ambulatory Visit (HOSPITAL_BASED_OUTPATIENT_CLINIC_OR_DEPARTMENT_OTHER): Payer: Medicare Other | Admitting: Oncology

## 2012-11-19 ENCOUNTER — Other Ambulatory Visit: Payer: Medicare Other | Admitting: Lab

## 2012-11-19 VITALS — BP 150/73 | HR 88 | Temp 96.9°F | Resp 20 | Ht 66.0 in | Wt 154.5 lb

## 2012-11-19 DIAGNOSIS — Z85038 Personal history of other malignant neoplasm of large intestine: Secondary | ICD-10-CM

## 2012-11-19 DIAGNOSIS — C187 Malignant neoplasm of sigmoid colon: Secondary | ICD-10-CM

## 2012-11-19 NOTE — Progress Notes (Signed)
   [facility]    OFFICE PROGRESS NOTE   INTERVAL HISTORY:   She returns as scheduled. She feels well. The hand/foot symptoms have resolved. Good appetite. No difficulty with bowel function. Chronic back pain. She underwent a colostomy reversal on 07/30/2012.  Objective:  Vital signs in last 24 hours:  Blood pressure 150/73, pulse 88, temperature 96.9 F (36.1 C), temperature source Oral, resp. rate 20, height 5\' 6"  (1.676 m), weight 154 lb 8 oz (70.081 kg).    HEENT: Neck without mass Lymphatics: No cervical, supra-clavicular, axillary, or inguinal nodes Resp: Distant breath sounds, no respiratory distress Cardio: Regular rate and rhythm GI: No hepatomegaly, nontender, no mass Vascular: The left lower leg is larger than the right side, no edema  Skin: Dry skin over the feet. No breakdown.   Medications: I have reviewed the patient's current medications.  Assessment/Plan: 1. Moderately-differentiated adenocarcinoma of the sigmoid colon (T4 N0) with associated bowel perforation, status post a sigmoid colectomy and diverting colostomy 04/02/2011. She completed the eighth and final cycle of adjuvant Xeloda chemotherapy beginning 10/14/2011. Colonoscopy 06/03/2012. 2. Hand/foot syndrome secondary to [facility] 3. Admission with a perforated sigmoid colon tumor and an abdominal abscess in January 2012. 4. Microcytic anemia: Likely due to iron deficiency, resolved. 5. Colostomy reversal 07/30/2012   Disposition:  She remains in clinical remission from colon cancer. She will return for an office visit and CEA in 6 months.   Thornton Papas, MD  11/19/2012  12:14 PM

## 2012-11-19 NOTE — Telephone Encounter (Signed)
gv and printed appt schedule for pt for June 2014 ° °

## 2012-11-20 LAB — CEA: CEA: 2.9 ng/mL (ref 0.0–5.0)

## 2012-11-25 ENCOUNTER — Telehealth: Payer: Self-pay | Admitting: *Deleted

## 2012-11-25 NOTE — Telephone Encounter (Signed)
Left VM for patient to call office regarding results.

## 2012-11-25 NOTE — Telephone Encounter (Signed)
Message copied by Wandalee Ferdinand on Wed Nov 25, 2012  9:57 AM ------      Message from: Ladene Artist      Created: Tue Nov 24, 2012  9:47 PM       Please call patient, cea is normal, f/u as scheduled

## 2012-11-26 ENCOUNTER — Telehealth: Payer: Self-pay | Admitting: *Deleted

## 2012-11-26 NOTE — Telephone Encounter (Signed)
Called and informed pt cea is normal per Dr. Truett Perna.  Pt verbalized understanding and confirmed appt for 05/20/13

## 2012-11-26 NOTE — Telephone Encounter (Signed)
Message copied by Gala Romney on Thu Nov 26, 2012  2:30 PM ------      Message from: Ladene Artist      Created: Tue Nov 24, 2012  9:47 PM       Please call patient, cea is normal, f/u as scheduled

## 2013-03-25 ENCOUNTER — Ambulatory Visit (INDEPENDENT_AMBULATORY_CARE_PROVIDER_SITE_OTHER): Payer: Medicare Other | Admitting: Surgery

## 2013-03-25 ENCOUNTER — Encounter (INDEPENDENT_AMBULATORY_CARE_PROVIDER_SITE_OTHER): Payer: Self-pay | Admitting: Surgery

## 2013-03-25 VITALS — BP 110/72 | HR 84 | Temp 97.4°F | Resp 18 | Ht 66.0 in | Wt 154.0 lb

## 2013-03-25 DIAGNOSIS — Z85038 Personal history of other malignant neoplasm of large intestine: Secondary | ICD-10-CM

## 2013-03-25 NOTE — Progress Notes (Signed)
[facility]  Ovidio Kin, MD,  FACS [address]  [address] [place], [place]    [address] Phone:  [phone] FAX:  [phone]   Re:   [name] DOB:   [birth year] MRN:   [id number]  ASSESSMENT AND PLAN: 1.  T4, N0 sigmoid colon cancer.  Resected with colostomy - 04/02/2011.  (Had hysterectomy at the same time)  Final path - 4.5 cm cancer, 0/40 nodes, cancer cells on the left fallopian tube.  Oncology - B. Sherrill, GI - M. Magod  She had 8 cycles of Xeloda - finished December 2012.  I did her colostomy reversal 07/30/2012.  She has done very well.  CEA - 2.9 - 11/19/2012.  I made her return appt in 6 months.  2.  Hypertension. 3.  Hypercholesteremia. 4.  Chronic back pain.    On muscle relaxants.   5.  Bilateral cataract in March 2014  Dr. Sherene Sires  HISTORY OF PRESENT ILLNESS: Chief Complaint  Patient presents with  . Follow-up    reck ostomy    [name] is a 79 y.o. (DOB: [birth year])  whtie female who is a patient of WOLTERS,SHARON A, MD and comes to me today for follow up of a colostomy reversal.  She had cataract surgery last month.  This has helped her vision.  She is eating well and having normal BMs.  She has no specific complaint or concern.  GI History: She had a colonoscopy by Dr. Ewing Schlein 06/05/2012 - he removed one polyp which proved benign.  For repeat colonoscopy this fall.  Social History: Her daughter, Bonita Quin, who works at [employer] hospital had knee surgery 2013. Her daughter, who had Parkinson's disease, died in November 16, 2011.  She has a son who lives outside of [place], [place].  PHYSICAL EXAM: BP 110/72  Pulse 84  Temp(Src) 97.4 F (36.3 C) (Temporal)  Resp 18  Ht 5\' 6"  (1.676 m)  Wt 154 lb (69.854 kg)  BMI 24.87 kg/m2  General: WN WF who is alert and generally healthy appearing.  HEENT: Normal. Pupils equal. Good dentition. Neck: Supple. No mass.  No thyroid mass.   Lymph Nodes:  No supraclavicular or  cervical nodes. Lungs: Clear to auscultation and symmetric breath sounds. Heart:  RRR. No murmur or rub. Abdomen: Soft. No mass. No tenderness. No hernia. Normal bowel sounds.  Lower midline scar and LLQ scar okay. Rectal: Not done.  Had colonoscopy last year. Extremities:  She moves slowly, but is able to get on the exam table without assistance.  DATA REVIEWED: Path report to the patient.  Ovidio Kin, MD, FACS Office:  [phone]

## 2013-05-20 ENCOUNTER — Ambulatory Visit (HOSPITAL_BASED_OUTPATIENT_CLINIC_OR_DEPARTMENT_OTHER): Payer: Medicare Other | Admitting: Nurse Practitioner

## 2013-05-20 ENCOUNTER — Other Ambulatory Visit (HOSPITAL_BASED_OUTPATIENT_CLINIC_OR_DEPARTMENT_OTHER): Payer: Medicare Other | Admitting: Lab

## 2013-05-20 ENCOUNTER — Telehealth: Payer: Self-pay | Admitting: Oncology

## 2013-05-20 VITALS — BP 153/64 | HR 73 | Temp 98.4°F | Resp 20 | Ht 66.0 in | Wt 159.8 lb

## 2013-05-20 DIAGNOSIS — Z85038 Personal history of other malignant neoplasm of large intestine: Secondary | ICD-10-CM

## 2013-05-20 DIAGNOSIS — C187 Malignant neoplasm of sigmoid colon: Secondary | ICD-10-CM

## 2013-05-20 NOTE — Progress Notes (Signed)
OFFICE PROGRESS NOTE  Interval history:  [name] returns as scheduled. She feels well. Bowels overall moving regularly. She intermittently takes a laxative. No bloody or black stools. She has a good appetite. No abdominal pain. Stable chronic back pain.   Objective: Blood pressure 153/64, pulse 73, temperature 98.4 F (36.9 C), temperature source Oral, resp. rate 20, height 5\' 6"  (1.676 m), weight 159 lb 12.8 oz (72.485 kg).  Oropharynx is without thrush or ulceration. No palpable cervical, supraclavicular, axillary or inguinal lymph nodes. Lungs are clear. Regular cardiac rhythm. Abdomen soft and nontender. No organomegaly. Extremities are without edema.  Lab Results: Lab Results  Component Value Date   WBC 6.9 08/01/2012   HGB 12.3 08/01/2012   HCT 37.7 08/01/2012   MCV 94.5 08/01/2012   PLT 150 08/01/2012    Chemistry:    Chemistry      Component Value Date/Time   NA 139 08/01/2012 0436   K 4.1 08/01/2012 0436   CL 106 08/01/2012 0436   CO2 27 08/01/2012 0436   BUN 6 08/01/2012 0436   CREATININE 0.69 08/01/2012 0436      Component Value Date/Time   CALCIUM 8.3* 08/01/2012 0436   ALKPHOS 70 07/24/2012 0940   AST 15 07/24/2012 0940   ALT 12 07/24/2012 0940   BILITOT 0.4 07/24/2012 0940       Studies/Results: No results found.  Medications: I have reviewed the patient's current medications.  Assessment/Plan:  1. Moderately-differentiated adenocarcinoma of the sigmoid colon (T4 N0) with associated bowel perforation, status post a sigmoid colectomy and diverting colostomy 04/02/2011. She completed the eighth and final cycle of adjuvant Xeloda chemotherapy beginning 10/14/2011. 2. Colonoscopy 06/03/2012. 3. Hand/foot syndrome secondary to [facility]. 4. Admission with a perforated sigmoid colon tumor and an abdominal abscess in January 2012. 5. Microcytic anemia: Likely due to iron deficiency, resolved. 6. Colostomy reversal 07/30/2012.  Disposition-[name]  appears stable. She remains in clinical remission from colon cancer. We will followup on the CEA from today and contact her with the result. She will return for a followup visit and CEA in 6 months.  Plan reviewed with Dr. Truett Perna.  [name] ANP/GNP-BC

## 2013-05-21 ENCOUNTER — Other Ambulatory Visit: Payer: Self-pay | Admitting: Gastroenterology

## 2013-05-21 NOTE — Addendum Note (Signed)
Addended by: Temprence Rhines on: 05/21/2013 05:20 PM   Modules accepted: Orders  

## 2013-05-27 ENCOUNTER — Telehealth: Payer: Self-pay | Admitting: *Deleted

## 2013-05-27 NOTE — Telephone Encounter (Signed)
Message copied by Gala Romney on Thu May 27, 2013 11:31 AM ------      Message from: Thornton Papas B      Created: Thu May 20, 2013  9:14 PM       Please call patient, cea is normal, f/u as scheduled ------

## 2013-05-27 NOTE — Telephone Encounter (Signed)
Spoke with pt's daughter Bonita Quin; informed of cea normal and confirmed appt for 11/19/13.  Informed daughter that office tried pt's home # and it stated it was changed.  Bonita Quin said she would check in to that b/c she had called this am and it was working.

## 2013-07-01 ENCOUNTER — Other Ambulatory Visit: Payer: Self-pay | Admitting: *Deleted

## 2013-07-01 DIAGNOSIS — Z48812 Encounter for surgical aftercare following surgery on the circulatory system: Secondary | ICD-10-CM

## 2013-07-05 ENCOUNTER — Other Ambulatory Visit: Payer: Self-pay | Admitting: Gastroenterology

## 2013-07-06 NOTE — Addendum Note (Signed)
Addended by: Ido Wollman on: 07/06/2013 01:00 PM   Modules accepted: Orders  

## 2013-07-08 ENCOUNTER — Encounter (HOSPITAL_COMMUNITY): Payer: Self-pay | Admitting: Pharmacy Technician

## 2013-07-09 ENCOUNTER — Ambulatory Visit (HOSPITAL_COMMUNITY)
Admission: RE | Admit: 2013-07-09 | Discharge: 2013-07-09 | Disposition: A | Discharge status: Home / Self Care | Payer: Medicare Other | Source: Ambulatory Visit | Attending: Gastroenterology | Admitting: Gastroenterology

## 2013-07-09 ENCOUNTER — Encounter (HOSPITAL_COMMUNITY)
Admission: RE | Disposition: A | Discharge status: Home / Self Care | Payer: Self-pay | Source: Ambulatory Visit | Attending: Gastroenterology

## 2013-07-09 ENCOUNTER — Encounter (HOSPITAL_COMMUNITY): Payer: Self-pay | Admitting: *Deleted

## 2013-07-09 DIAGNOSIS — Z9049 Acquired absence of other specified parts of digestive tract: Secondary | ICD-10-CM | POA: Insufficient documentation

## 2013-07-09 DIAGNOSIS — Z860100 Personal history of colon polyps, unspecified: Secondary | ICD-10-CM | POA: Insufficient documentation

## 2013-07-09 DIAGNOSIS — I10 Essential (primary) hypertension: Secondary | ICD-10-CM | POA: Insufficient documentation

## 2013-07-09 DIAGNOSIS — Z87891 Personal history of nicotine dependence: Secondary | ICD-10-CM | POA: Insufficient documentation

## 2013-07-09 DIAGNOSIS — I6529 Occlusion and stenosis of unspecified carotid artery: Secondary | ICD-10-CM | POA: Insufficient documentation

## 2013-07-09 DIAGNOSIS — Z79899 Other long term (current) drug therapy: Secondary | ICD-10-CM | POA: Insufficient documentation

## 2013-07-09 DIAGNOSIS — D126 Benign neoplasm of colon, unspecified: Secondary | ICD-10-CM | POA: Insufficient documentation

## 2013-07-09 DIAGNOSIS — M899 Disorder of bone, unspecified: Secondary | ICD-10-CM | POA: Insufficient documentation

## 2013-07-09 DIAGNOSIS — K5730 Diverticulosis of large intestine without perforation or abscess without bleeding: Secondary | ICD-10-CM | POA: Insufficient documentation

## 2013-07-09 DIAGNOSIS — M47817 Spondylosis without myelopathy or radiculopathy, lumbosacral region: Secondary | ICD-10-CM | POA: Insufficient documentation

## 2013-07-09 DIAGNOSIS — E7800 Pure hypercholesterolemia, unspecified: Secondary | ICD-10-CM | POA: Insufficient documentation

## 2013-07-09 DIAGNOSIS — Z85038 Personal history of other malignant neoplasm of large intestine: Secondary | ICD-10-CM | POA: Insufficient documentation

## 2013-07-09 DIAGNOSIS — I658 Occlusion and stenosis of other precerebral arteries: Secondary | ICD-10-CM | POA: Insufficient documentation

## 2013-07-09 HISTORY — PX: HOT HEMOSTASIS: SHX5433

## 2013-07-09 HISTORY — PX: COLONOSCOPY: SHX5424

## 2013-07-09 HISTORY — DX: Unspecified osteoarthritis, unspecified site: M19.90

## 2013-07-09 SURGERY — COLONOSCOPY
Anesthesia: Moderate Sedation

## 2013-07-09 MED ORDER — FENTANYL CITRATE 0.05 MG/ML IJ SOLN
INTRAMUSCULAR | Status: DC | PRN
  Administered 2013-07-09 (×2): 25 ug via INTRAVENOUS

## 2013-07-09 MED ORDER — MIDAZOLAM HCL 10 MG/2ML IJ SOLN
INTRAMUSCULAR | Status: AC
  Filled 2013-07-09: qty 4

## 2013-07-09 MED ORDER — MIDAZOLAM HCL 5 MG/5ML IJ SOLN
INTRAMUSCULAR | Status: DC | PRN
  Administered 2013-07-09 (×3): 1 mg via INTRAVENOUS

## 2013-07-09 MED ORDER — SODIUM CHLORIDE 0.9 % IV SOLN
INTRAVENOUS | Status: DC
  Administered 2013-07-09: 500 mL via INTRAVENOUS

## 2013-07-09 MED ORDER — DIPHENHYDRAMINE HCL 50 MG/ML IJ SOLN
INTRAMUSCULAR | Status: AC
  Filled 2013-07-09: qty 1

## 2013-07-09 MED ORDER — FENTANYL CITRATE 0.05 MG/ML IJ SOLN
INTRAMUSCULAR | Status: AC
  Filled 2013-07-09: qty 4

## 2013-07-09 NOTE — Progress Notes (Signed)
[name] 7:50 AM  Subjective: Patient without any new complaints since we've seen her and no specific GI complaints  Objective: Vital signs stable afebrile no acute distress exam please see pre-assessment evaluation  Assessment: Difficult to remove a descending colon polyp in patient with history of colon cancer  Plan: Okay to proceed with colonoscopy today  Arshdeep Bolger E

## 2013-07-09 NOTE — Op Note (Signed)
[facility] [address] [place], [address]   COLONOSCOPY PROCEDURE REPORT  PATIENT: [name], [name]  MR#: [id number] BIRTHDATE: 04-09-1932 , 81  yrs. old GENDER: Female ENDOSCOPIST: Vida Rigger, MD REFERRED XB:JYNWGN Paulino Rily, M.D. PROCEDURE DATE:  07/09/2013 PROCEDURE:   Colonoscopy with snare polypectomy ASA CLASS:   Class II INDICATIONS:Patient's personal history of colon cancer and difficult to remove adenomatous colon polyps. MEDICATIONS: Fentanyl 50 mcg IV and Versed 3 mg IV  DESCRIPTION OF PROCEDURE:   After the risks benefits and alternatives of the procedure were thoroughly explained, informed consent was obtained.  A digital rectal exam revealed no abnormalities of the rectum.   The Pentax Ped Colon [id number] endoscope was introduced through the anus and advanced to the cecum, which was identified by both the appendix and ileocecal valve. No adverse events experienced.   The quality of the prep was adequate.  The instrument was then slowly withdrawn as the colon was fully examined.the findings are recorded below and the patient tolerated the procedure well there was no obvious immediate complication       Retroflexed views revealed no abnormalities. The time to cecum= .  Withdrawal time=  .  The scope was withdrawn and the procedure completed. COMPLICATIONS: There were no complications.  ENDOSCOPIC IMPRESSION:1. Low rectal sigmoid anastomosis 2 ccasional diverticula3. Descending residual polyp small status post snare and removed 4. Otherwise within normal limits to the cecum RECOMMENDATIONS:await pathology probably repeat colonoscopy in 3 years happy to see back sooner when necessary   eSigned:  Vida Rigger, MD 07/09/2013 9:00 AM   cc: Mila Palmer, MD

## 2013-07-12 ENCOUNTER — Encounter (HOSPITAL_COMMUNITY): Payer: Self-pay | Admitting: Gastroenterology

## 2013-07-26 ENCOUNTER — Encounter: Payer: Self-pay | Admitting: Vascular Surgery

## 2013-07-27 ENCOUNTER — Ambulatory Visit (INDEPENDENT_AMBULATORY_CARE_PROVIDER_SITE_OTHER): Payer: Medicare Other | Admitting: Vascular Surgery

## 2013-07-27 ENCOUNTER — Other Ambulatory Visit (INDEPENDENT_AMBULATORY_CARE_PROVIDER_SITE_OTHER): Payer: Medicare Other | Admitting: *Deleted

## 2013-07-27 ENCOUNTER — Encounter: Payer: Self-pay | Admitting: Vascular Surgery

## 2013-07-27 DIAGNOSIS — I6529 Occlusion and stenosis of unspecified carotid artery: Secondary | ICD-10-CM | POA: Insufficient documentation

## 2013-07-27 DIAGNOSIS — Z48812 Encounter for surgical aftercare following surgery on the circulatory system: Secondary | ICD-10-CM

## 2013-07-27 NOTE — Progress Notes (Signed)
[facility]  Established Carotid Patient  History of Present Illness  [name] is a 79 y.o. (September 14, 1932) female who presents with no complaints: S/P right carotid endartertectomy.  Previous carotid studies demonstrated: RICA patent without restenosis, LICA <40%% stenosis.  Patient has no history of TIA or stroke symptom.  The patient has not had amaurosis fugax or monocular blindness.  The patient has not had facial drooping or hemiplegia.  The patient has not had receptive or expressive aphasia.    She has recovered from colon cancer now out 3 years.  Other medical history includes hypertension treated with lisinopril, hypercholesterolemia treated with crestor.    Review of Systems  Constitutional: Negative for fever, chills and malaise/fatigue.  HENT: Negative for hearing loss, ear pain, congestion and sore throat.   Eyes: Negative for blurred vision and double vision.  Respiratory: Negative for cough and hemoptysis.   Cardiovascular: Negative for chest pain and palpitations.  Gastrointestinal: Negative for heartburn, nausea, vomiting and blood in stool.  Genitourinary: Negative for dysuria.  Musculoskeletal: Negative for myalgias.  Skin: Negative for itching and rash.  Neurological: Negative for dizziness, weakness and headaches.  Psychiatric/Behavioral: Negative for depression.     Current Outpatient Prescriptions on File Prior to Visit  Medication Sig Dispense Refill  . CALCIUM-VITAMIN D PO Take 1 tablet by mouth daily.      . Cyanocobalamin (VITAMIN B-12 CR PO) Take 1 tablet by mouth daily.      Marland Kitchen lisinopril (PRINIVIL,ZESTRIL) 10 MG tablet Take 10 mg by mouth daily after breakfast.      . naproxen sodium (ANAPROX) 220 MG tablet Take 440 mg by mouth 2 (two) times daily as needed (pain).      Bertram Gala Glycol-Propyl Glycol (SYSTANE ULTRA) 0.4-0.3 % SOLN Place 1 drop into both ears 2 (two) times daily as needed (dry eyes).      .  rosuvastatin (CRESTOR) 10 MG tablet Take 10 mg by mouth every evening.      Marland Kitchen tiZANidine (ZANAFLEX) 4 MG tablet Take 4 mg by mouth every 6 (six) hours as needed (pain).       No current facility-administered medications on file prior to visit.     Physical Examination  Filed Vitals:   07/27/13 1513  BP: 147/90  Pulse: 79  Resp: 18  Height: 5\' 6"  (1.676 m)  Weight: 161 lb 1.6 oz (73.074 kg)   Body mass index is 26.01 kg/(m^2).  General: A&O x 3, WD Eyes: PERRLA, EOMI Neck: Supple,  Pulmonary: Sym exp, good air movt, CTAB, no rales, rhonchi, & wheezing Cardiac: RRR, Nl S1, S2, no Murmurs, rubs or gallops Vascular: Vessel Right Left  Radial Palpable Palpable  Brachial Palpable Palpable  Carotid Palpable, without bruit Palpable, without bruit  Aorta Not palpable N/A  Femoral Palpable Palpable  Popliteal Not palpable Not palpable  PT Palpable Palpable  DP Palpable Palpable   Gastrointestinal: soft, NTND,  Musculoskeletal: M/S 5/5 throughoutExtremities without ischemic changes  Neurologic: CN 2-12 intact , Pain and light touch intact in extremities  Motor exam as listed above  Non-Invasive Vascular Imaging  CAROTID DUPLEX (Date: 07/27/2013):   R ICA stenosis: <40%    L ICA stenosis: <40%%    Medical Decision Making  [name] is a 79 y.o. female who presents with: S/P right carotid endarterectomy 2009   Based on the patient's vascular studies and examination, I have offered the patient: 1 year follow.  process will progress, limiting  the benefit of any interventions. The patient is currently  on a statin: crestor.   The patient is currently not on an anti-platelet: .   Thomasena Edis Pavel Gadd [facility] PA-C [facility] Office: [phone]   07/27/2013, 3:39 PM   I have examined the patient, reviewed and agree with above.  EARLY, TODD, MD 07/27/2013 4:04 PM

## 2013-07-28 NOTE — Addendum Note (Signed)
Addended by: Sharee Pimple on: 07/28/2013 09:32 AM   Modules accepted: Orders

## 2013-11-19 ENCOUNTER — Other Ambulatory Visit: Payer: Medicare Other

## 2013-11-19 ENCOUNTER — Ambulatory Visit (HOSPITAL_BASED_OUTPATIENT_CLINIC_OR_DEPARTMENT_OTHER): Payer: Medicare Other | Admitting: Oncology

## 2013-11-19 VITALS — BP 165/71 | HR 82 | Temp 97.5°F | Resp 20 | Ht 66.0 in | Wt 160.9 lb

## 2013-11-19 DIAGNOSIS — C187 Malignant neoplasm of sigmoid colon: Secondary | ICD-10-CM

## 2013-11-19 DIAGNOSIS — Z85038 Personal history of other malignant neoplasm of large intestine: Secondary | ICD-10-CM

## 2013-11-19 LAB — CEA: CEA: 3.1 ng/mL (ref 0.0–5.0)

## 2013-11-19 NOTE — Progress Notes (Signed)
   [facility]    OFFICE PROGRESS NOTE   INTERVAL HISTORY:   She returns for scheduled followup of colon cancer. She reports chronic back pain. She developed constipation when taking pain medication for this. No bleeding. No other complaint.  Objective:  Vital signs in last 24 hours:  Blood pressure 165/71, pulse 82, temperature 97.5 F (36.4 C), temperature source Oral, resp. rate 20, height 5\' 6"  (1.676 m), weight 160 lb 14.4 oz (72.984 kg).    HEENT: Neck without mass Lymphatics: No cervical, supra-clavicular, axillary, or inguinal nodes Resp: Lungs clear bilaterally, distant breath sounds, no respiratory distress Cardio: Regular rate and rhythm GI: No hepatomegaly, nontender, no mass Vascular: No leg edema    Lab Results:  CEA pending  Medications: I have reviewed the patient's current medications.  Assessment/Plan: 1. Moderately-differentiated adenocarcinoma of the sigmoid colon (T4 N0) with associated bowel perforation, status post a sigmoid colectomy and diverting colostomy 04/02/2011. She completed the eighth and final cycle of adjuvant Xeloda chemotherapy beginning 10/14/2011. 2. Colonoscopy 07/09/2013, polyp removed-tubular adenoma 3. Hand/foot syndrome secondary to [facility]. 4. Admission with a perforated sigmoid colon tumor and an abdominal abscess in January 2012. 5. Microcytic anemia: Likely due to iron deficiency, resolved. 6. Colostomy reversal 07/30/2013   Disposition:  She remains in remission from colon cancer. We will followup on the CEA from today. She will return for an office visit and CEA in 6 months.   Thornton Papas, MD  11/19/2013  11:01 AM

## 2013-11-22 ENCOUNTER — Telehealth: Payer: Self-pay | Admitting: Oncology

## 2013-11-22 NOTE — Telephone Encounter (Signed)
Per 12/12 POF appts made sw pt and advised JUNE 2015 cal mailed shh

## 2013-11-23 ENCOUNTER — Telehealth: Payer: Self-pay | Admitting: *Deleted

## 2013-11-23 NOTE — Telephone Encounter (Signed)
Called pt with normal CEA results. She voiced understanding, appreciation for call.  

## 2013-11-23 NOTE — Telephone Encounter (Signed)
Message copied by Caleb Popp on Tue Nov 23, 2013 10:37 AM ------      Message from: Thornton Papas B      Created: Mon Nov 22, 2013  8:12 PM       Please call patient, cea is normal ------

## 2014-01-28 ENCOUNTER — Other Ambulatory Visit: Payer: Self-pay | Admitting: Vascular Surgery

## 2014-01-28 DIAGNOSIS — I6529 Occlusion and stenosis of unspecified carotid artery: Secondary | ICD-10-CM

## 2014-01-28 DIAGNOSIS — Z48812 Encounter for surgical aftercare following surgery on the circulatory system: Secondary | ICD-10-CM

## 2014-02-11 ENCOUNTER — Other Ambulatory Visit: Payer: Self-pay

## 2014-02-11 DIAGNOSIS — Z1231 Encounter for screening mammogram for malignant neoplasm of breast: Secondary | ICD-10-CM

## 2014-03-03 ENCOUNTER — Ambulatory Visit
Admission: RE | Admit: 2014-03-03 | Discharge: 2014-03-03 | Disposition: A | Discharge status: Home / Self Care | Payer: Medicare Other | Source: Ambulatory Visit

## 2014-03-03 DIAGNOSIS — Z1231 Encounter for screening mammogram for malignant neoplasm of breast: Secondary | ICD-10-CM

## 2014-03-29 ENCOUNTER — Encounter (INDEPENDENT_AMBULATORY_CARE_PROVIDER_SITE_OTHER): Payer: Self-pay

## 2014-05-23 ENCOUNTER — Other Ambulatory Visit: Payer: Medicare Other

## 2014-05-23 ENCOUNTER — Ambulatory Visit (HOSPITAL_BASED_OUTPATIENT_CLINIC_OR_DEPARTMENT_OTHER): Payer: Medicare Other | Admitting: Nurse Practitioner

## 2014-05-23 VITALS — BP 159/87 | HR 86 | Temp 98.4°F | Resp 18 | Ht 66.0 in | Wt 158.0 lb

## 2014-05-23 DIAGNOSIS — Z85038 Personal history of other malignant neoplasm of large intestine: Secondary | ICD-10-CM

## 2014-05-23 NOTE — Progress Notes (Signed)
  [facility] OFFICE PROGRESS NOTE   Diagnosis:  Colon cancer.  INTERVAL HISTORY:   Ms. Corney returns as scheduled. No change in bowel habits. No bloody or black stools. She reports stable chronic back pain. No abdominal pain. No shortness of breath or cough. She has a good appetite and overall good energy level.  Objective:  Vital signs in last 24 hours:  Blood pressure 159/87, pulse 86, temperature 98.4 F (36.9 C), temperature source Oral, resp. rate 18, height 5\' 6"  (1.676 m), weight 158 lb (71.668 kg), SpO2 99.00%.    HEENT: No thrush or ulceration. Lymphatics: No palpable cervical, supraclavicular, axillary or inguinal lymph nodes. Resp: Lungs clear. Breath sounds are distant. Cardio: Regular cardiac rhythm. GI: Abdomen soft and nontender. No mass. No organomegaly. Vascular: No leg edema.   Lab Results:  Lab Results  Component Value Date   WBC 6.9 08/01/2012   HGB 12.3 08/01/2012   HCT 37.7 08/01/2012   MCV 94.5 08/01/2012   PLT 150 08/01/2012   NEUTROABS 4.9 07/24/2012   CEA pending. Imaging:  No results found.  Medications: I have reviewed the patient's current medications.  Assessment/Plan: 1. Moderately-differentiated adenocarcinoma of the sigmoid colon (T4 N0) with associated bowel perforation, status post a sigmoid colectomy and diverting colostomy 04/02/2011. She completed the eighth and final cycle of adjuvant Xeloda chemotherapy beginning 10/14/2011. 2. Colonoscopy 07/09/2013, polyp removed-tubular adenoma. 3. Hand/foot syndrome secondary to [facility]. 4. Admission with a perforated sigmoid colon tumor and an abdominal abscess in January 2012. 5. Microcytic anemia: Likely due to iron deficiency, resolved. 6. Colostomy reversal 07/30/2013.   Disposition: She remains in clinical remission from colon cancer. We will followup on the CEA from today. She will return for a followup visit and CEA in 6 months.  She will contact the office  in the interim with any problems.  Plan reviewed with Dr. Benay Spice.    Ned Card ANP/GNP-BC   05/23/2014  12:36 PM

## 2014-05-24 LAB — CEA: CEA: 4.1 ng/mL (ref 0.0–5.0)

## 2014-05-25 ENCOUNTER — Telehealth: Payer: Self-pay | Admitting: Oncology

## 2014-05-25 ENCOUNTER — Telehealth: Payer: Self-pay | Admitting: *Deleted

## 2014-05-25 NOTE — Telephone Encounter (Signed)
Message copied by Domenic Schwab on Wed May 25, 2014  5:00 PM ------      Message from: Ladell Pier      Created: Tue May 24, 2014 10:19 PM       Please call patient, cea is normal, f/u as scheduled 72mo. Office and cea ------

## 2014-05-25 NOTE — Telephone Encounter (Signed)
s.w. pt and advised on Dec appt....pt ok adn aware

## 2014-05-25 NOTE — Telephone Encounter (Signed)
Per Dr. Benay Spice; notified pt that cea is normal and f/u as scheduled in 6 months.  Pt verbalized understanding and expressed appreciation for call.

## 2014-05-26 ENCOUNTER — Ambulatory Visit (INDEPENDENT_AMBULATORY_CARE_PROVIDER_SITE_OTHER): Payer: Medicare Other | Admitting: Surgery

## 2014-05-26 VITALS — BP 128/76 | HR 104 | Temp 98.0°F | Resp 18 | Ht 65.0 in | Wt 158.0 lb

## 2014-05-26 DIAGNOSIS — Z85038 Personal history of other malignant neoplasm of large intestine: Secondary | ICD-10-CM

## 2014-05-26 NOTE — Progress Notes (Signed)
[place], MD,  [name] Berg.,  [name] Berg, [name] Berg    [name] Berg Phone:  [phone] FAX:  [phone]   Re:   [name] Berg DOB:   [birth year] MRN:   [id number]  ASSESSMENT AND PLAN: 1.  T4, N0 sigmoid colon cancer.  Resected with colostomy - 04/02/2011.  (Had hysterectomy at the same time)  Final path - 4.5 cm cancer, 0/40 nodes, cancer cells on the left fallopian tube.  Oncology - B. [name] Berg, GI - M. [name]  She had 8 cycles of Xeloda - finished December 2012.  I did her colostomy reversal 07/30/2012.  She has done very well.  CEA - 4.1 - 05/23/2014.  I made her return appt in 6 months.  2.  Hypertension. 3.  Hypercholesteremia. 4.  Chronic back pain.    On muscle relaxants.   5.  Bilateral cataract in March 2014 - doing well, except she forgets her eye drops  Dr. Verlon Berg  HISTORY OF PRESENT ILLNESS: Chief Complaint  Patient presents with  . long term f/u    colon    [name] Berg is Berg 80 y.o. (DOB: November 21, 1932)  whtie female who is Berg patient of [name] A, MD and comes to me today for follow up of Berg colostomy reversal. She comes by herself. He 82th birthday was yesterday. She saw [name] Berg for oncology on 05/23/2014.  She is doing well.  No complaint of bowels or belly.  She had Berg colonoscopy by Dr. Watt Berg on 07/09/2013.  He found Berg polyp, but said that he next colonoscopy could be in 3 years. Her birthday was yesterday and she is having Berg little party this weekend.  GI History: She had Berg colonoscopy by Dr. Watt Berg 07/09/2013.  Next colonoscopy in 3 years.  Social History: Her daughter, [name] Berg, who works at [name] [employer] had knee surgery 2013. Her daughter, who had [name] Berg, died in 10-30-11.   She has Berg son who lives outside of [place], [place].  PHYSICAL EXAM: BP 128/76  Pulse 104  Temp(Src) 98 F (36.7 C)  Resp 18  Ht 5\' 5"  (1.651 m)  Wt 158 lb (71.668 kg)  BMI 26.29  kg/m2  General: WN WF who is alert. HEENT: Normal. Pupils equal.  Neck: Supple. No mass.  No thyroid mass.   Lymph Nodes:  No supraclavicular or cervical nodes. Lungs: Clear to auscultation and symmetric breath sounds. Heart:  RRR. No murmur or rub. Abdomen: Soft. No mass. No tenderness. No hernia. Normal bowel sounds.  Lower midline scar and LLQ scar okay. Rectal: Not done.  Had colonoscopy last year. Extremities:  She moves slowly, but is able to get on the exam table without assistance.  DATA REVIEWED: Epic notes  [name] Overall, MD, [place] Office:  [phone]

## 2014-08-08 ENCOUNTER — Encounter: Payer: Self-pay | Admitting: Family

## 2014-08-09 ENCOUNTER — Encounter: Payer: Self-pay | Admitting: Family

## 2014-08-09 ENCOUNTER — Ambulatory Visit (HOSPITAL_COMMUNITY)
Admission: RE | Admit: 2014-08-09 | Discharge: 2014-08-09 | Disposition: A | Discharge status: Home / Self Care | Payer: Medicare Other | Source: Ambulatory Visit | Attending: Family | Admitting: Family

## 2014-08-09 ENCOUNTER — Encounter: Payer: Medicare Other | Admitting: Family

## 2014-08-09 VITALS — BP 158/86 | HR 71 | Resp 16 | Ht 65.5 in | Wt 157.0 lb

## 2014-08-09 DIAGNOSIS — I6529 Occlusion and stenosis of unspecified carotid artery: Secondary | ICD-10-CM

## 2014-08-09 DIAGNOSIS — Z48812 Encounter for surgical aftercare following surgery on the circulatory system: Secondary | ICD-10-CM

## 2014-08-09 NOTE — Patient Instructions (Signed)
Dear Ms. Willis,  Your recent visit with the Vascular Lab Sept. 1, 2015  Indicates:  NO  Significant change compared to prior exam Aug. 19, 2014 Please follow up with [place] in one year.          Stroke Prevention Some medical conditions and behaviors are associated with an increased chance of having a stroke. You may prevent a stroke by making healthy choices and managing medical conditions. HOW CAN I REDUCE MY RISK OF HAVING A STROKE?   Stay physically active. Get at least 30 minutes of activity on most or all days.  Do not smoke. It may also be helpful to avoid exposure to secondhand smoke.  Limit alcohol use. Moderate alcohol use is considered to be:  No more than 2 drinks per day for men.  No more than 1 drink per day for nonpregnant women.  Eat healthy foods. This involves:  Eating 5 or more servings of fruits and vegetables a day.  Making dietary changes that address high blood pressure (hypertension), high cholesterol, diabetes, or obesity.  Manage your cholesterol levels.  Making food choices that are high in fiber and low in saturated fat, trans fat, and cholesterol may control cholesterol levels.  Take any prescribed medicines to control cholesterol as directed by your health care provider.  Manage your diabetes.  Controlling your carbohydrate and sugar intake is recommended to manage diabetes.  Take any prescribed medicines to control diabetes as directed by your health care provider.  Control your hypertension.  Making food choices that are low in salt (sodium), saturated fat, trans fat, and cholesterol is recommended to manage hypertension.  Take any prescribed medicines to control hypertension as directed by your health care provider.  Maintain a healthy weight.  Reducing calorie intake and making food choices that are low in sodium, saturated fat, trans fat, and cholesterol are recommended to manage weight.  Stop drug abuse.  Avoid taking birth  control pills.  Talk to your health care provider about the risks of taking birth control pills if you are over 16 years old, smoke, get migraines, or have ever had a blood clot.  Get evaluated for sleep disorders (sleep apnea).  Talk to your health care provider about getting a sleep evaluation if you snore a lot or have excessive sleepiness.  Take medicines only as directed by your health care provider.  For some people, aspirin or blood thinners (anticoagulants) are helpful in reducing the risk of forming abnormal blood clots that can lead to stroke. If you have the irregular heart rhythm of atrial fibrillation, you should be on a blood thinner unless there is a good reason you cannot take them.  Understand all your medicine instructions.  Make sure that other conditions (such as anemia or atherosclerosis) are addressed. SEEK IMMEDIATE MEDICAL CARE IF:   You have sudden weakness or numbness of the face, arm, or leg, especially on one side of the body.  Your face or eyelid droops to one side.  You have sudden confusion.  You have trouble speaking (aphasia) or understanding.  You have sudden trouble seeing in one or both eyes.  You have sudden trouble walking.  You have dizziness.  You have a loss of balance or coordination.  You have a sudden, severe headache with no known cause.  You have new chest pain or an irregular heartbeat. Any of these symptoms may represent a serious problem that is an emergency. Do not wait to see if the symptoms will go  away. Get medical help at once. Call your local emergency services (911 in [place] Do not drive yourself to the hospital. Document Released: 01/02/2005 Document Revised: 04/11/2014 Document Reviewed: 05/28/2013 [facility] Patient Information 2015 [place], [place]. This information is not intended to replace advice given to you by your health care provider. Make sure you discuss any questions you have with your health care provider.

## 2014-08-10 ENCOUNTER — Encounter: Payer: Self-pay | Admitting: Vascular Surgery

## 2014-08-10 NOTE — Progress Notes (Signed)
Lab only 

## 2014-11-09 ENCOUNTER — Telehealth: Payer: Self-pay | Admitting: Oncology

## 2014-11-09 NOTE — Telephone Encounter (Signed)
lvm advising appt chg from 12/15 to 12/24 @ 9am. Also mailed revised appt calendar.

## 2014-11-22 ENCOUNTER — Ambulatory Visit: Payer: Medicare Other | Admitting: Oncology

## 2014-11-22 ENCOUNTER — Other Ambulatory Visit: Payer: Medicare Other

## 2014-12-01 ENCOUNTER — Other Ambulatory Visit: Payer: Medicare Other

## 2014-12-01 ENCOUNTER — Ambulatory Visit (HOSPITAL_BASED_OUTPATIENT_CLINIC_OR_DEPARTMENT_OTHER): Payer: Medicare Other | Admitting: Oncology

## 2014-12-01 ENCOUNTER — Telehealth: Payer: Self-pay | Admitting: Oncology

## 2014-12-01 VITALS — BP 140/68 | HR 84 | Temp 98.1°F | Resp 18 | Ht 65.0 in | Wt 162.8 lb

## 2014-12-01 DIAGNOSIS — Z85038 Personal history of other malignant neoplasm of large intestine: Secondary | ICD-10-CM

## 2014-12-01 LAB — CEA: CEA: 3.9 ng/mL (ref 0.0–5.0)

## 2014-12-01 NOTE — Progress Notes (Signed)
  [facility] OFFICE PROGRESS NOTE   Diagnosis: Colon cancer  INTERVAL HISTORY:   She returns as scheduled. She reports sinus drainage. No other complaint. No difficulty with bowel function.  Objective:  Vital signs in last 24 hours:  Blood pressure 140/68, pulse 84, temperature 98.1 F (36.7 C), temperature source Oral, resp. rate 18, height 5\' 5"  (1.651 m), weight 162 lb 12.8 oz (73.846 kg), SpO2 98 %.    HEENT: Neck without mass Lymphatics: No cervical, supra-clavicular, axillary, or inguinal nodes Resp: End inspiratory rhonchi at the left posterior base, no respiratory distress, distant breath sounds Cardio: Regular rate and rhythm GI: No hepatomegaly, nontender, no mass Vascular: No leg edema   Lab Results:    Lab Results  Component Value Date   CEA 4.1 05/23/2014    Medications: I have reviewed the patient's current medications.  Assessment/Plan: 1. Moderately-differentiated adenocarcinoma of the sigmoid colon (T4 N0) with associated bowel perforation, status post a sigmoid colectomy and diverting colostomy 04/02/2011. She completed the eighth and final cycle of adjuvant Xeloda chemotherapy beginning 10/14/2011. 2. Colonoscopy 07/09/2013, polyp removed-tubular adenoma. 3. Hand/foot syndrome secondary to [facility]. 4. Admission with a perforated sigmoid colon tumor and an abdominal abscess in January 2012. 5. Microcytic anemia: Likely due to iron deficiency, resolved. 6. Colostomy reversal 07/30/2013   Disposition:  She remains in clinical remission from colon cancer. We will follow-up on the CEA from today. She continues colonoscopy follow-up with Dr. Watt Climes. Ms. Rawlinson will return for an office visit and CEA in 6 months.  Betsy Coder, MD  12/01/2014  9:33 AM

## 2014-12-01 NOTE — Telephone Encounter (Signed)
Gave avs & cal for June. °

## 2014-12-05 ENCOUNTER — Telehealth: Payer: Self-pay | Admitting: *Deleted

## 2014-12-05 NOTE — Telephone Encounter (Signed)
-----   Message from Ladell Pier, MD sent at 12/01/2014  3:33 PM EST ----- Please call patient, cea is normal

## 2014-12-05 NOTE — Telephone Encounter (Signed)
Per Dr. Benay Spice; notified pt that cea is normal.  Pt verbalized understanding and confirmed appt for 06/01/2015.

## 2015-03-29 ENCOUNTER — Ambulatory Visit
Admission: RE | Admit: 2015-03-29 | Discharge: 2015-03-29 | Disposition: A | Discharge status: Home / Self Care | Payer: Medicare Other | Source: Ambulatory Visit | Attending: Family Medicine | Admitting: Family Medicine

## 2015-03-29 ENCOUNTER — Other Ambulatory Visit: Payer: Self-pay | Admitting: Family Medicine

## 2015-03-29 DIAGNOSIS — R059 Cough, unspecified: Secondary | ICD-10-CM

## 2015-03-29 DIAGNOSIS — R05 Cough: Secondary | ICD-10-CM

## 2015-03-29 DIAGNOSIS — M48061 Spinal stenosis, lumbar region without neurogenic claudication: Secondary | ICD-10-CM

## 2015-06-01 ENCOUNTER — Telehealth: Payer: Self-pay | Admitting: Oncology

## 2015-06-01 ENCOUNTER — Ambulatory Visit (HOSPITAL_BASED_OUTPATIENT_CLINIC_OR_DEPARTMENT_OTHER): Payer: Medicare Other | Admitting: Nurse Practitioner

## 2015-06-01 ENCOUNTER — Other Ambulatory Visit: Payer: Medicare Other

## 2015-06-01 VITALS — BP 166/98 | HR 90 | Temp 98.2°F | Resp 18 | Ht 65.0 in | Wt 162.8 lb

## 2015-06-01 DIAGNOSIS — Z85038 Personal history of other malignant neoplasm of large intestine: Secondary | ICD-10-CM

## 2015-06-01 NOTE — Progress Notes (Signed)
  [facility] OFFICE PROGRESS NOTE   Diagnosis:  Colon cancer  INTERVAL HISTORY:   Ms.Kujala returns as scheduled. She feels well. She has a good appetite. She describes her energy level as "adequate". No change in bowel habits. No bloody or black stools. No abdominal pain. No nausea or vomiting.  Objective:  Vital signs in last 24 hours:  Blood pressure 166/98, pulse 90, temperature 98.2 F (36.8 C), temperature source Oral, resp. rate 18, height 5\' 5"  (1.651 m), weight 162 lb 12.8 oz (73.846 kg), SpO2 98 %.    HEENT: No thrush or ulcers. Lymphatics: No palpable cervical, supra clavicular, axillary or inguinal lymph nodes. Resp: Faint rhonchi right lower lung field. No respiratory distress. Lungs otherwise clear. Distant breath sounds. Cardio: Regular rate and rhythm. GI: Abdomen soft and nontender. No hepatomegaly. No mass. Vascular: No leg edema.   Lab Results:  Lab Results  Component Value Date   WBC 6.9 08/01/2012   HGB 12.3 08/01/2012   HCT 37.7 08/01/2012   MCV 94.5 08/01/2012   PLT 150 08/01/2012   NEUTROABS 4.9 07/24/2012    Imaging:  No results found.  Medications: I have reviewed the patient's current medications.  Assessment/Plan: 1. Moderately-differentiated adenocarcinoma of the sigmoid colon (T4 N0) with associated bowel perforation, status post a sigmoid colectomy and diverting colostomy 04/02/2011. She completed the eighth and final cycle of adjuvant Xeloda chemotherapy beginning 10/14/2011. 2. Colonoscopy 07/09/2013, polyp removed-tubular adenoma. Next colonoscopy recommended at a 3 year interval. 3. Hand/foot syndrome secondary to [facility]. 4. Admission with a perforated sigmoid colon tumor and an abdominal abscess in January 2012. 5. Microcytic anemia: Likely due to iron deficiency, resolved. 6. Colostomy reversal 07/30/2013   Disposition: Ms. Demauro remains in clinical remission from colon cancer. We will follow-up  on the CEA from today. She will return for a follow-up visit and CEA in 6 months.  Her blood pressure was elevated in the office. She did not take her blood pressure medication this morning. She plans to take as soon as she gets home.  Ned Card ANP/GNP-BC   06/01/2015  9:37 AM

## 2015-06-01 NOTE — Telephone Encounter (Signed)
Gave aND PRINTED APPT SCHED AND AVS FO RPT FOR dec

## 2015-06-02 ENCOUNTER — Telehealth: Payer: Self-pay | Admitting: *Deleted

## 2015-06-02 LAB — CEA: CEA: 3.4 ng/mL (ref 0.0–5.0)

## 2015-06-02 NOTE — Telephone Encounter (Signed)
No answer

## 2015-06-02 NOTE — Telephone Encounter (Signed)
Informed patient of Normal cea.  Per Dr. Benay Spice.   Patient verbalized understanding.

## 2015-06-02 NOTE — Telephone Encounter (Signed)
-----   Message from Ladell Pier, MD sent at 06/02/2015  7:23 AM EDT ----- Please call patient, cea is normal

## 2015-08-15 ENCOUNTER — Ambulatory Visit: Payer: Medicaid Other | Admitting: Family

## 2015-08-15 ENCOUNTER — Other Ambulatory Visit (HOSPITAL_COMMUNITY): Payer: Medicare Other

## 2015-08-22 ENCOUNTER — Encounter: Payer: Self-pay | Admitting: Family

## 2015-08-24 ENCOUNTER — Ambulatory Visit (INDEPENDENT_AMBULATORY_CARE_PROVIDER_SITE_OTHER): Payer: Medicare Other | Admitting: Family

## 2015-08-24 ENCOUNTER — Encounter: Payer: Self-pay | Admitting: Family

## 2015-08-24 ENCOUNTER — Ambulatory Visit (HOSPITAL_COMMUNITY)
Admission: RE | Admit: 2015-08-24 | Discharge: 2015-08-24 | Disposition: A | Discharge status: Home / Self Care | Payer: Medicare Other | Source: Ambulatory Visit | Attending: Family | Admitting: Family

## 2015-08-24 VITALS — BP 152/85 | HR 75 | Temp 98.8°F | Resp 16 | Ht 65.0 in | Wt 166.0 lb

## 2015-08-24 DIAGNOSIS — Z48812 Encounter for surgical aftercare following surgery on the circulatory system: Secondary | ICD-10-CM

## 2015-08-24 DIAGNOSIS — I6523 Occlusion and stenosis of bilateral carotid arteries: Secondary | ICD-10-CM | POA: Diagnosis not present

## 2015-08-24 DIAGNOSIS — Z9889 Other specified postprocedural states: Secondary | ICD-10-CM | POA: Diagnosis not present

## 2015-08-24 DIAGNOSIS — E785 Hyperlipidemia, unspecified: Secondary | ICD-10-CM | POA: Insufficient documentation

## 2015-08-24 DIAGNOSIS — I10 Essential (primary) hypertension: Secondary | ICD-10-CM | POA: Insufficient documentation

## 2015-08-24 DIAGNOSIS — I6522 Occlusion and stenosis of left carotid artery: Secondary | ICD-10-CM | POA: Insufficient documentation

## 2015-08-24 NOTE — Progress Notes (Signed)
Established Carotid Patient   History of Present Illness  [name] is a 81 y.o. female patient of Dr. Donnetta Hutching who is S/P right carotid endarterectomy in 2009. She returns today for follow up. Previous carotid studies demonstrated: RICA patent without restenosis, LICA <40%% stenosis.   The patient denies any history of TIA or stroke symptoms, specifically the patient denies a history of amaurosis fugax or monocular blindness, denies a history unilateral  of facial drooping, denies a history of hemiplegia, and denies a history of receptive or expressive aphasia.      She is in remission from colon cancer 5 years. Other medical history includes hypertension treated with lisinopril, hypercholesterolemia treated with crestor.   She states she was in Dr. Perley Jain office yesterday and states she was told that her blood pressure was OK, and saw her PCP a month ago. She has not yet taken her morning dose of lisinopril.   The patient reports New Medical or Surgical History:  colonoscopy soon for post colon cancer surveillance.  Pt Diabetic: no Pt smoker: former smoker, quit in 1980  Pt meds include: Statin : yes ASA: no, has a history of bowel perforation in 2012 Other anticoagulants/antiplatelets: no   Past Medical History  Diagnosis Date  . Bowel perforation 2012  . Hypertension   . Hyperlipidemia   . Cancer     colon  . Arthritis   . Carotid artery occlusion     Social History Social History  Substance Use Topics  . Smoking status: Former Smoker    Quit date: 12/24/1978  . Smokeless tobacco: Never Used  . Alcohol Use: Yes     Comment: glass of wine at christmas    Family History Family History  Problem Relation Age of Onset  . Heart disease Mother     heart attack  . Cancer Sister     breast    Surgical History Past Surgical History  Procedure Laterality Date  . Abdominal hysterectomy    . Colostomy    . Colostomy takedown  07/30/2012    Procedure:  LAPAROSCOPIC COLOSTOMY TAKEDOWN;  Surgeon: Shann Medal, MD;  Location: [facility];  Service: General;  Laterality: N/A;  laparoscopic colostomy reversal plus bilateral ureteral stents  . Colon surgery      colon resection with colostomy 03/2011  . Colonoscopy N/A 07/09/2013    Procedure: COLONOSCOPY;  Surgeon: Jeryl Columbia, MD;  Location: [facility];  Service: Endoscopy;  Laterality: N/A;  . Hot hemostasis N/A 07/09/2013    Procedure: HOT HEMOSTASIS (ARGON PLASMA COAGULATION/BICAP);  Surgeon: Jeryl Columbia, MD;  Location: [facility];  Service: Endoscopy;  Laterality: N/A;  . Carotid endarterectomy Right 04-05-08    CE    No Known Allergies  Current Outpatient Prescriptions  Medication Sig Dispense Refill  . CALCIUM-VITAMIN D PO Take 1 tablet by mouth daily.    . Cyanocobalamin (VITAMIN B-12 CR PO) Take 1 tablet by mouth daily.    Marland Kitchen HYDROcodone-acetaminophen (NORCO/VICODIN) 5-325 MG per tablet Take 1 tablet by mouth every 6 (six) hours as needed.    Marland Kitchen ibuprofen (ADVIL,MOTRIN) 200 MG tablet Take 200 mg by mouth every 6 (six) hours as needed for pain.    Marland Kitchen lisinopril (PRINIVIL,ZESTRIL) 10 MG tablet Take 10 mg by mouth daily after breakfast.    . Polyethyl Glycol-Propyl Glycol (SYSTANE ULTRA) 0.4-0.3 % SOLN Place 1 drop into both eyes 2 (two) times daily as needed (dry eyes).     . rosuvastatin (CRESTOR) 10  MG tablet Take 10 mg by mouth every evening.    Marland Kitchen tiZANidine (ZANAFLEX) 4 MG tablet Take 4 mg by mouth every 6 (six) hours as needed (pain).     No current facility-administered medications for this visit.    Review of Systems : See HPI for pertinent positives and negatives.  Physical Examination  Filed Vitals:   08/24/15 1318 08/24/15 1321 08/24/15 1325 08/24/15 1326  BP: 168/88 165/83 154/84 152/85  Pulse: 74 74 77 75  Temp:  98.8 F (37.1 C)    TempSrc:  Oral    Resp:  16    Height:  5\' 5"  (1.651 m)    Weight:  166 lb (75.297 kg)    SpO2:  96%     Body mass index is 27.62  kg/(m^2).  General: WDWN female in NAD GAIT: normal Eyes: PERRLA Pulmonary:  Non-labored, CTAB, no rales, no rhonchi, & no wheezing.  Cardiac: regular rhythm,  no detected murmur.  VASCULAR EXAM Carotid Bruits Right Left   Negative Negative    Aorta is not palpable. Radial pulses are 2+ palpable and equal.                                                                                                                            LE Pulses Right Left       POPLITEAL  not palpable   not palpable       POSTERIOR TIBIAL  not palpable   not palpable        DORSALIS PEDIS      ANTERIOR TIBIAL faintly palpable  faintly palpable     Gastrointestinal: soft, nontender, BS WNL, no r/g,  no palpable masses.  Musculoskeletal: No muscle atrophy/wasting. M/S 5/5 throughout extremities without ischemic changes.  Neurologic: A&O X 3; Appropriate Affect, Speech is normal CN 2-12 intact, pain and light touch intact in extremities, Motor exam as listed above.   Non-Invasive Vascular Imaging CAROTID DUPLEX 08/24/2015   Right ICA: no stenosis post endarterectomy. Left ICA: 40 - 59 % stenosis.  Previous carotid studies demonstrated: RICA <52% stenosis, LICA <84% stenosis.  Slightly increased stenosis of the left ICA compared to a year ago.   Assessment: [name] is a 81 y.o. female who is S/P right carotid endarterectomy in 2009. She has no history of stroke or TIA. Today's carotid Duplex suggests a patent right endarterectomy site with no evidence of restenosis or hyperplasia and 40-59% left ICA stenosis. Slight progression of left ICA disease compared to a year ago.   Plan: Follow-up in 1 year with Carotid Duplex.   I discussed in depth with the patient the nature of atherosclerosis, and emphasized the importance of maximal medical management including strict control of blood pressure, blood glucose, and lipid levels, obtaining regular exercise, and continued cessation of  smoking.  The patient is aware that without maximal medical management the underlying atherosclerotic disease process will progress, limiting the benefit of any interventions.  The patient was given information about stroke prevention and what symptoms should prompt the patient to seek immediate medical care. Thank you for allowing [place] to participate in this patient's care.  Clemon Chambers, RN, MSN, FNP-C [facility] Office: [place]: Oneida Alar  08/24/2015 1:19 PM

## 2015-08-24 NOTE — Patient Instructions (Signed)
Stroke Prevention Some medical conditions and behaviors are associated with an increased chance of having a stroke. You may prevent a stroke by making healthy choices and managing medical conditions. HOW CAN I REDUCE MY RISK OF HAVING A STROKE?   Stay physically active. Get at least 30 minutes of activity on most or all days.  Do not smoke. It may also be helpful to avoid exposure to secondhand smoke.  Limit alcohol use. Moderate alcohol use is considered to be:  No more than 2 drinks per day for men.  No more than 1 drink per day for nonpregnant women.  Eat healthy foods. This involves:  Eating 5 or more servings of fruits and vegetables a day.  Making dietary changes that address high blood pressure (hypertension), high cholesterol, diabetes, or obesity.  Manage your cholesterol levels.  Making food choices that are high in fiber and low in saturated fat, trans fat, and cholesterol may control cholesterol levels.  Take any prescribed medicines to control cholesterol as directed by your health care provider.  Manage your diabetes.  Controlling your carbohydrate and sugar intake is recommended to manage diabetes.  Take any prescribed medicines to control diabetes as directed by your health care provider.  Control your hypertension.  Making food choices that are low in salt (sodium), saturated fat, trans fat, and cholesterol is recommended to manage hypertension.  Take any prescribed medicines to control hypertension as directed by your health care provider.  Maintain a healthy weight.  Reducing calorie intake and making food choices that are low in sodium, saturated fat, trans fat, and cholesterol are recommended to manage weight.  Stop drug abuse.  Avoid taking birth control pills.  Talk to your health care provider about the risks of taking birth control pills if you are over 35 years old, smoke, get migraines, or have ever had a blood clot.  Get evaluated for sleep  disorders (sleep apnea).  Talk to your health care provider about getting a sleep evaluation if you snore a lot or have excessive sleepiness.  Take medicines only as directed by your health care provider.  For some people, aspirin or blood thinners (anticoagulants) are helpful in reducing the risk of forming abnormal blood clots that can lead to stroke. If you have the irregular heart rhythm of atrial fibrillation, you should be on a blood thinner unless there is a good reason you cannot take them.  Understand all your medicine instructions.  Make sure that other conditions (such as anemia or atherosclerosis) are addressed. SEEK IMMEDIATE MEDICAL CARE IF:   You have sudden weakness or numbness of the face, arm, or leg, especially on one side of the body.  Your face or eyelid droops to one side.  You have sudden confusion.  You have trouble speaking (aphasia) or understanding.  You have sudden trouble seeing in one or both eyes.  You have sudden trouble walking.  You have dizziness.  You have a loss of balance or coordination.  You have a sudden, severe headache with no known cause.  You have new chest pain or an irregular heartbeat. Any of these symptoms may represent a serious problem that is an emergency. Do not wait to see if the symptoms will go away. Get medical help at once. Call your local emergency services (911 in [place] Do not drive yourself to the hospital. Document Released: 01/02/2005 Document Revised: 04/11/2014 Document Reviewed: 05/28/2013 Exit[employer] Patient Information 2015 [employer]. This information is not intended to replace advice given   to you by your health care provider. Make sure you discuss any questions you have with your health care provider.  

## 2015-08-24 NOTE — Progress Notes (Signed)
Filed Vitals:   08/24/15 1318 08/24/15 1321 08/24/15 1325 08/24/15 1326  BP: 168/88 165/83 154/84 152/85  Pulse: 74 74 77 75  Temp:  98.8 F (37.1 C)    TempSrc:  Oral    Resp:  16    Height:  5\' 5"  (1.651 m)    Weight:  166 lb (75.297 kg)    SpO2:  96%

## 2015-08-25 NOTE — Addendum Note (Signed)
Addended by: Dorthula Rue L on: 08/25/2015 12:44 PM   Modules accepted: Orders

## 2015-09-07 ENCOUNTER — Other Ambulatory Visit: Payer: Self-pay | Admitting: Gastroenterology

## 2015-11-24 ENCOUNTER — Telehealth: Payer: Self-pay | Admitting: Oncology

## 2015-11-24 NOTE — Telephone Encounter (Signed)
Per BS (schedule sent to [facility] moved 12/22 lab/fu one month out. Left message for patient and mailed schedule.

## 2015-11-30 ENCOUNTER — Ambulatory Visit: Payer: Medicare Other | Admitting: Oncology

## 2015-11-30 ENCOUNTER — Other Ambulatory Visit: Payer: Medicare Other

## 2015-12-29 ENCOUNTER — Ambulatory Visit (HOSPITAL_BASED_OUTPATIENT_CLINIC_OR_DEPARTMENT_OTHER): Payer: Medicare Other | Admitting: Oncology

## 2015-12-29 ENCOUNTER — Other Ambulatory Visit (HOSPITAL_BASED_OUTPATIENT_CLINIC_OR_DEPARTMENT_OTHER): Payer: Medicare Other

## 2015-12-29 VITALS — BP 163/75 | HR 77 | Temp 98.3°F | Resp 17 | Ht 65.0 in | Wt 164.7 lb

## 2015-12-29 DIAGNOSIS — Z85038 Personal history of other malignant neoplasm of large intestine: Secondary | ICD-10-CM | POA: Diagnosis not present

## 2015-12-29 NOTE — Progress Notes (Signed)
error 

## 2015-12-29 NOTE — Progress Notes (Signed)
  [place] OFFICE PROGRESS NOTE   Diagnosis:  Colon cancer  INTERVAL HISTORY:    [name] returns as scheduled. She  Has "sinus "drainage. She otherwise feels well. She underwent a colonoscopy by Dr. Watt Climes on 09/07/2015. Polyps were removed from the sigmoid colon and the pathology returned as Hgb or adenoma and hyperplastic polyp.     Objective:  Vital signs in last 24 hours:  Blood pressure 163/75, pulse 77, temperature 98.3 F (36.8 C), temperature source Oral, resp. rate 17, height 5\' 5"  (1.651 m), weight 164 lb 11.2 oz (74.707 kg), SpO2 98 %.    HEENT:  Neck without mass Lymphatics:  No cervical, supra-clavicular, axillary, or inguinal nodes Resp:  Few coarse rhonchi at the posterior chest bilaterally, no respiratory distress Cardio:  Regular rate and rhythm GI:  No hepatomegaly, no mass, nontender Vascular:  The left lower leg is slightly larger than the right side, no edema   Lab Results:   CEA pending  Medications: I have reviewed the patient's current medications.  Assessment/Plan: 1. Moderately-differentiated adenocarcinoma of the sigmoid colon (T4 N0) with associated bowel perforation, status post a sigmoid colectomy and diverting colostomy 04/02/2011. She completed the eighth and final cycle of adjuvant Xeloda chemotherapy beginning 10/14/2011. 2. Colonoscopy 07/09/2013, polyp removed-tubular adenoma.  Colonoscopy September 2016 , tubular adenoma and hyperplastic polyp removed 3. Hand/foot syndrome secondary to [facility]. 4. Admission with a perforated sigmoid colon tumor and an abdominal abscess in January 2012. 5. Microcytic anemia: Likely due to iron deficiency, resolved. 6. Colostomy reversal 07/30/2013    Disposition:   [name] remains in clinical remission from colon cancer. She is now almost 5 years out from diagnosis. She was discharged from the oncology clinic today.   She plans to continue clinical follow-up with  Dr. Stephanie Acre. She can continue surveillance colonoscopies if recommended by Dr. Watt Climes.   I am available to see her in the future as needed.  Betsy Coder, MD  12/29/2015  12:00 PM

## 2015-12-30 LAB — CEA: CEA: 4.6 ng/mL (ref 0.0–4.7)

## 2015-12-30 LAB — CEA (PARALLEL TESTING): CEA: 3.9 ng/mL (ref 0.0–5.0)

## 2016-01-01 ENCOUNTER — Telehealth: Payer: Self-pay | Admitting: *Deleted

## 2016-01-01 NOTE — Telephone Encounter (Signed)
-----   Message from Ladell Pier, MD sent at 01/01/2016  1:47 PM EST ----- Please call patient, cea is normal

## 2016-01-01 NOTE — Telephone Encounter (Signed)
Per Dr. Sherrill; notified pt that cea is normal.  Pt verbalized understanding. 

## 2016-08-22 ENCOUNTER — Encounter: Payer: Self-pay | Admitting: Family

## 2016-08-27 ENCOUNTER — Encounter: Payer: Self-pay | Admitting: Family

## 2016-08-27 ENCOUNTER — Ambulatory Visit (INDEPENDENT_AMBULATORY_CARE_PROVIDER_SITE_OTHER): Payer: Medicare Other | Admitting: Family

## 2016-08-27 ENCOUNTER — Ambulatory Visit (HOSPITAL_COMMUNITY)
Admission: RE | Admit: 2016-08-27 | Discharge: 2016-08-27 | Disposition: A | Discharge status: Home / Self Care | Payer: Medicare Other | Source: Ambulatory Visit | Attending: Family | Admitting: Family

## 2016-08-27 VITALS — BP 138/80 | HR 74 | Temp 97.1°F | Resp 16 | Ht 65.0 in | Wt 161.0 lb

## 2016-08-27 DIAGNOSIS — Z9889 Other specified postprocedural states: Secondary | ICD-10-CM | POA: Diagnosis present

## 2016-08-27 DIAGNOSIS — I6522 Occlusion and stenosis of left carotid artery: Secondary | ICD-10-CM | POA: Diagnosis not present

## 2016-08-27 DIAGNOSIS — Z87891 Personal history of nicotine dependence: Secondary | ICD-10-CM | POA: Diagnosis not present

## 2016-08-27 DIAGNOSIS — Z48812 Encounter for surgical aftercare following surgery on the circulatory system: Secondary | ICD-10-CM

## 2016-08-27 DIAGNOSIS — I6523 Occlusion and stenosis of bilateral carotid arteries: Secondary | ICD-10-CM | POA: Diagnosis not present

## 2016-08-27 LAB — VAS US CAROTID
LCCADDIAS: 27 cm/s
LCCADSYS: 113 cm/s
LCCAPDIAS: 24 cm/s
LCCAPSYS: 108 cm/s
LEFT ECA DIAS: -14 cm/s
LICADSYS: -46 cm/s
LICAPSYS: 135 cm/s
Left ICA dist dias: -15 cm/s
Left ICA prox dias: 49 cm/s
RCCAPSYS: 81 cm/s
RIGHT CCA MID DIAS: 20 cm/s
RIGHT ECA DIAS: -13 cm/s
Right CCA prox dias: 14 cm/s
Right cca dist sys: -55 cm/s

## 2016-08-27 NOTE — Progress Notes (Signed)
Chief Complaint: Follow up Extracranial Carotid Artery Stenosis   History of Present Illness  [name] is a 82 y.o. female patient of Dr. Donnetta Hutching who is S/P right carotid endarterectomy in 2009. She returns today for follow up. Previous carotid studies demonstrated: RICA patent without restenosis, LICA [id number] stenosis.   The patient denies any history of TIA or stroke symptoms, specifically the patient denies a history of amaurosis fugax or monocular blindness, denies a history unilateral  of facial drooping, denies a history of hemiplegia, and denies a history of receptive or expressive aphasia.      She is in remission from colon cancer for several years. Other medical history includes hypertension treated with lisinopril, hypercholesterolemia treated with crestor.   She states she was in Dr. Perley Jain office yesterday and states she was told that her blood pressure was OK, and saw her PCP a month ago.  Pt Diabetic: no Pt smoker: former smoker, quit in 1980  Pt meds include: Statin : yes ASA: no, has a history of bowel perforation in 2012 Other anticoagulants/antiplatelets: no   Past Medical History:  Diagnosis Date  . Arthritis   . Bowel perforation ([place]) 2012  . Cancer ([place])    colon  . Carotid artery occlusion   . Hyperlipidemia   . Hypertension     Social History Social History  Substance Use Topics  . Smoking status: Former Smoker    Quit date: 12/24/1978  . Smokeless tobacco: Never Used  . Alcohol use Yes     Comment: glass of wine at christmas    Family History Family History  Problem Relation Age of Onset  . Heart disease Mother     heart attack  . Cancer Sister     breast    Surgical History Past Surgical History:  Procedure Laterality Date  . ABDOMINAL HYSTERECTOMY    . CAROTID ENDARTERECTOMY Right 04-05-08   CE  . COLON SURGERY     colon resection with colostomy 03/2011  . COLONOSCOPY N/A 07/09/2013   Procedure: COLONOSCOPY;   Surgeon: Jeryl Columbia, MD;  Location: [facility];  Service: Endoscopy;  Laterality: N/A;  . COLOSTOMY    . COLOSTOMY TAKEDOWN  07/30/2012   Procedure: LAPAROSCOPIC COLOSTOMY TAKEDOWN;  Surgeon: Shann Medal, MD;  Location: [facility];  Service: General;  Laterality: N/A;  laparoscopic colostomy reversal plus bilateral ureteral stents  . HOT HEMOSTASIS N/A 07/09/2013   Procedure: HOT HEMOSTASIS (ARGON PLASMA COAGULATION/BICAP);  Surgeon: Jeryl Columbia, MD;  Location: [facility];  Service: Endoscopy;  Laterality: N/A;    No Known Allergies  Current Outpatient Prescriptions  Medication Sig Dispense Refill  . CALCIUM-VITAMIN D PO Take 1 tablet by mouth daily.    . Cyanocobalamin (VITAMIN B-12 CR PO) Take 1 tablet by mouth daily.    . fluorometholone (FML) 0.1 % ophthalmic suspension     . HYDROcodone-acetaminophen (NORCO/VICODIN) 5-325 MG per tablet Take 1 tablet by mouth every 6 (six) hours as needed.    Marland Kitchen ibuprofen (ADVIL,MOTRIN) 200 MG tablet Take 200 mg by mouth every 6 (six) hours as needed for pain.    Marland Kitchen lisinopril (PRINIVIL,ZESTRIL) 10 MG tablet Take 10 mg by mouth daily after breakfast.    . Polyethyl Glycol-Propyl Glycol (SYSTANE ULTRA) 0.4-0.3 % SOLN Place 1 drop into both eyes 2 (two) times daily as needed (dry eyes).     . rosuvastatin (CRESTOR) 10 MG tablet Take 10 mg by mouth every evening.    Marland Kitchen tiZANidine (  ZANAFLEX) 4 MG tablet Take 4 mg by mouth every 6 (six) hours as needed (pain).     No current facility-administered medications for this visit.     Review of Systems : See HPI for pertinent positives and negatives.  Physical Examination  Vitals:   08/27/16 1303 08/27/16 1307  BP: 140/80 138/80  Pulse: 74   Resp: 16   Temp: 97.1 F (36.2 C)   TempSrc: Oral   SpO2: 96%   Weight: 161 lb (73 kg)   Height: 5\' 5"  (1.651 m)    Body mass index is 26.79 kg/m.  General: WDWN female in NAD GAIT: normal Eyes: PERRLA Pulmonary: Respirations are non-labored, CTAB, no  rales, no rhonchi, & no wheezing.  Cardiac: regular rhythm and rate, no detected murmur.  VASCULAR EXAM Carotid Bruits Right Left   Negative Negative    Aorta is not palpable. Radial pulses are 2+ palpable and equal.                                                                                                                                          LE Pulses Right Left       POPLITEAL  not palpable  not palpable       POSTERIOR TIBIAL  not palpable  not palpable       DORSALIS PEDIS      ANTERIOR TIBIAL faintly palpable faintly palpable    Gastrointestinal: soft, nontender, BS WNL, no r/g,  no palpable masses.  Musculoskeletal: No muscle atrophy/wasting. M/S 5/5 throughout extremities without ischemic changes.  Neurologic: A&O X 3; Appropriate Affect, Speech is normal CN 2-12 intact except is hard of hearing, pain and light touch intact in extremities, Motor exam as listed above.    Assessment: [name] is a 82 y.o. female who is S/P right carotid endarterectomy in 2009. She has no history of stroke or TIA.  DATA Today's carotid Duplex suggests a patent right endarterectomy site with no evidence of restenosis or hyperplasia and 40-59% left ICA stenosis. Both vertebral arteries are antegrade.  Both subclavian arteries are multiphasic and normal.  No significant change from exam of 08/24/15.    Plan: Follow-up in 1 year with Carotid Duplex scan.   I discussed in depth with the patient the nature of atherosclerosis, and emphasized the importance of maximal medical management including strict control of blood pressure, blood glucose, and lipid levels, obtaining regular exercise, and continued cessation of smoking.  The patient is aware that without maximal medical management the underlying atherosclerotic disease process will progress, limiting the benefit of any interventions. The patient was given information about stroke prevention and what symptoms should  prompt the patient to seek immediate medical care. Thank you for allowing [place] to participate in this patient's care.  Clemon Chambers, RN, MSN, FNP-C [facility] [place] Office: [phone]  Clinic Physician: Early  08/27/16 1:20 PM

## 2016-08-27 NOTE — Patient Instructions (Signed)
Stroke Prevention Some medical conditions and behaviors are associated with an increased chance of having a stroke. You may prevent a stroke by making healthy choices and managing medical conditions. HOW CAN I REDUCE MY RISK OF HAVING A STROKE?   Stay physically active. Get at least 30 minutes of activity on most or all days.  Do not smoke. It may also be helpful to avoid exposure to secondhand smoke.  Limit alcohol use. Moderate alcohol use is considered to be:  No more than 2 drinks per day for men.  No more than 1 drink per day for nonpregnant women.  Eat healthy foods. This involves:  Eating 5 or more servings of fruits and vegetables a day.  Making dietary changes that address high blood pressure (hypertension), high cholesterol, diabetes, or obesity.  Manage your cholesterol levels.  Making food choices that are high in fiber and low in saturated fat, trans fat, and cholesterol may control cholesterol levels.  Take any prescribed medicines to control cholesterol as directed by your health care provider.  Manage your diabetes.  Controlling your carbohydrate and sugar intake is recommended to manage diabetes.  Take any prescribed medicines to control diabetes as directed by your health care provider.  Control your hypertension.  Making food choices that are low in salt (sodium), saturated fat, trans fat, and cholesterol is recommended to manage hypertension.  Ask your health care provider if you need treatment to lower your blood pressure. Take any prescribed medicines to control hypertension as directed by your health care provider.  If you are 18-39 years of age, have your blood pressure checked every 3-5 years. If you are 40 years of age or older, have your blood pressure checked every year.  Maintain a healthy weight.  Reducing calorie intake and making food choices that are low in sodium, saturated fat, trans fat, and cholesterol are recommended to manage  weight.  Stop drug abuse.  Avoid taking birth control pills.  Talk to your health care provider about the risks of taking birth control pills if you are over 35 years old, smoke, get migraines, or have ever had a blood clot.  Get evaluated for sleep disorders (sleep apnea).  Talk to your health care provider about getting a sleep evaluation if you snore a lot or have excessive sleepiness.  Take medicines only as directed by your health care provider.  For some people, aspirin or blood thinners (anticoagulants) are helpful in reducing the risk of forming abnormal blood clots that can lead to stroke. If you have the irregular heart rhythm of atrial fibrillation, you should be on a blood thinner unless there is a good reason you cannot take them.  Understand all your medicine instructions.  Make sure that other conditions (such as anemia or atherosclerosis) are addressed. SEEK IMMEDIATE MEDICAL CARE IF:   You have sudden weakness or numbness of the face, arm, or leg, especially on one side of the body.  Your face or eyelid droops to one side.  You have sudden confusion.  You have trouble speaking (aphasia) or understanding.  You have sudden trouble seeing in one or both eyes.  You have sudden trouble walking.  You have dizziness.  You have a loss of balance or coordination.  You have a sudden, severe headache with no known cause.  You have new chest pain or an irregular heartbeat. Any of these symptoms may represent a serious problem that is an emergency. Do not wait to see if the symptoms will   go away. Get medical help at once. Call your local emergency services (911 in [place] Do not drive yourself to the hospital.   This information is not intended to replace advice given to you by your health care provider. Make sure you discuss any questions you have with your health care provider.   Document Released: 01/02/2005 Document Revised: 12/16/2014 Document Reviewed:  05/28/2013 Elsevier Interactive Patient Education 2016 [employer].  

## 2016-08-27 NOTE — Addendum Note (Signed)
Addended by: Kaleen Mask on: 08/27/2016 04:14 PM   Modules accepted: Orders

## 2017-02-13 ENCOUNTER — Other Ambulatory Visit: Payer: Self-pay | Admitting: Plastic Surgery

## 2017-09-02 ENCOUNTER — Encounter: Payer: Self-pay | Admitting: Family

## 2017-09-02 ENCOUNTER — Ambulatory Visit (HOSPITAL_COMMUNITY)
Admission: RE | Admit: 2017-09-02 | Discharge: 2017-09-02 | Disposition: A | Discharge status: Home / Self Care | Payer: Medicare Other | Source: Ambulatory Visit | Attending: Vascular Surgery | Admitting: Vascular Surgery

## 2017-09-02 ENCOUNTER — Ambulatory Visit (INDEPENDENT_AMBULATORY_CARE_PROVIDER_SITE_OTHER): Payer: Medicare Other | Admitting: Family

## 2017-09-02 VITALS — BP 155/84 | HR 67 | Temp 99.8°F | Resp 16 | Ht 65.0 in | Wt 159.9 lb

## 2017-09-02 DIAGNOSIS — Z87891 Personal history of nicotine dependence: Secondary | ICD-10-CM

## 2017-09-02 DIAGNOSIS — I6523 Occlusion and stenosis of bilateral carotid arteries: Secondary | ICD-10-CM | POA: Diagnosis not present

## 2017-09-02 DIAGNOSIS — Z9889 Other specified postprocedural states: Secondary | ICD-10-CM | POA: Diagnosis not present

## 2017-09-02 LAB — VAS US CAROTID
LCCADDIAS: 20 cm/s
LCCAPSYS: 93 cm/s
LEFT ECA DIAS: -15 cm/s
LEFT VERTEBRAL DIAS: 28 cm/s
LICADSYS: -92 cm/s
LICAPSYS: -104 cm/s
Left CCA dist sys: 81 cm/s
Left CCA prox dias: 20 cm/s
Left ICA dist dias: -23 cm/s
Left ICA prox dias: -33 cm/s
RIGHT CCA MID DIAS: 19 cm/s
RIGHT ECA DIAS: -7 cm/s
RIGHT VERTEBRAL DIAS: 17 cm/s
Right CCA prox dias: 18 cm/s
Right CCA prox sys: 91 cm/s
Right cca dist sys: -65 cm/s

## 2017-09-02 NOTE — Progress Notes (Signed)
Chief Complaint: Follow up Extracranial Carotid Artery Stenosis   History of Present Illness  [name] is a 83 y.o. female patient of Dr. Donnetta Hutching who is S/P right carotid endarterectomy in 2009. She returns today for follow up. Previous carotid studies demonstrated: RICA patent without restenosis, LICA <65%% stenosis.   The patient denies any history of TIA or stroke symptoms, specifically she denies a history of amaurosis fugax or monocular blindness, unilateral facial drooping, hemiplegia, or receptive or expressive aphasia.    She is in remission from colon cancer for several years. Other medical history includes hypertension treated with lisinopril, hypercholesterolemia treated with crestor.   She does not seem to have claudication sx's with walking. She does have significant hx of back problems.   Pt Diabetic: no Pt smoker: former smoker, quit in 1980  Pt meds include: Statin : yes ASA: no, only prn for headache Other anticoagulants/antiplatelets: no   Past Medical History:  Diagnosis Date  . Arthritis   . Bowel perforation ([place]) 2012  . Cancer ([place])    colon  . Carotid artery occlusion   . Hyperlipidemia   . Hypertension     Social History Social History  Substance Use Topics  . Smoking status: Former Smoker    Quit date: 12/24/1978  . Smokeless tobacco: Never Used  . Alcohol use Yes     Comment: glass of wine at christmas    Family History Family History  Problem Relation Age of Onset  . Heart disease Mother        heart attack  . Cancer Sister        breast    Surgical History Past Surgical History:  Procedure Laterality Date  . ABDOMINAL HYSTERECTOMY    . CAROTID ENDARTERECTOMY Right 04-05-08   CE  . COLON SURGERY     colon resection with colostomy 03/2011  . COLONOSCOPY N/A 07/09/2013   Procedure: COLONOSCOPY;  Surgeon: Jeryl Columbia, MD;  Location: [facility];  Service: Endoscopy;  Laterality: N/A;  . COLOSTOMY    .  COLOSTOMY TAKEDOWN  07/30/2012   Procedure: LAPAROSCOPIC COLOSTOMY TAKEDOWN;  Surgeon: Shann Medal, MD;  Location: [facility];  Service: General;  Laterality: N/A;  laparoscopic colostomy reversal plus bilateral ureteral stents  . HOT HEMOSTASIS N/A 07/09/2013   Procedure: HOT HEMOSTASIS (ARGON PLASMA COAGULATION/BICAP);  Surgeon: Jeryl Columbia, MD;  Location: [facility];  Service: Endoscopy;  Laterality: N/A;    No Known Allergies  Current Outpatient Prescriptions  Medication Sig Dispense Refill  . CALCIUM-VITAMIN D PO Take 1 tablet by mouth daily.    . Cyanocobalamin (VITAMIN B-12 CR PO) Take 1 tablet by mouth daily.    . fluorometholone (FML) 0.1 % ophthalmic suspension     . HYDROcodone-acetaminophen (NORCO/VICODIN) 5-325 MG per tablet Take 1 tablet by mouth every 6 (six) hours as needed.    Marland Kitchen ibuprofen (ADVIL,MOTRIN) 200 MG tablet Take 200 mg by mouth every 6 (six) hours as needed for pain.    Marland Kitchen lisinopril (PRINIVIL,ZESTRIL) 10 MG tablet Take 10 mg by mouth daily after breakfast.    . Polyethyl Glycol-Propyl Glycol (SYSTANE ULTRA) 0.4-0.3 % SOLN Place 1 drop into both eyes 2 (two) times daily as needed (dry eyes).     . rosuvastatin (CRESTOR) 10 MG tablet Take 10 mg by mouth every evening.    Marland Kitchen tiZANidine (ZANAFLEX) 4 MG tablet Take 4 mg by mouth every 6 (six) hours as needed (pain).     No current  facility-administered medications for this visit.     Review of Systems : See HPI for pertinent positives and negatives.  Physical Examination  Vitals:   09/02/17 1344 09/02/17 1349  BP: (!) 142/86 (!) 155/84  Pulse: 67   Resp: 16   Temp: 99.8 F (37.7 C)   TempSrc: Oral   SpO2: 99%   Weight: 159 lb 14.4 oz (72.5 kg)   Height: 5\' 5"  (1.651 m)    Body mass index is 26.61 kg/m.  General: WDWN female in NAD GAIT:normal Eyes: PERRLA Pulmonary: Respirations are non-labored, CTAB, no rales, no rhonchi, &no wheezing.  Cardiac: regular rhythm and rate, no detected  murmur.  VASCULAR EXAM Carotid Bruits Right Left   Negative Negative  Aorta is not palpable. Radial pulses are 2+ palpable and equal.   LE Pulses Right Left  POPLITEAL not palpable not palpable  POSTERIOR TIBIAL not palpable not palpable  DORSALIS PEDIS ANTERIOR TIBIAL faintly palpable faintly palpable    Gastrointestinal: soft, nontender, BS WNL, no r/g, no palpable masses.  Musculoskeletal: No muscle atrophy/wasting. M/S 5/5 throughout extremities without ischemic changes.  Neurologic: A&O X 3; Appropriate Affect, Speech is normal CN 2-12 intact except is hard of hearing, pain and light touch intact in extremities, Motor exam as listed above    Assessment: [name] is a 83 y.o. female who is S/P right carotid endarterectomy in 2009. She has no history of stroke or TIA.  DATA Carotid Duplex (09/02/17): Right endarterectomy site with no evidence of restenosis or hyperplasia. 1-39% left ICA stenosis. Both vertebral arteries are antegrade.  Both subclavian arteries are multiphasic and normal.  Less stenosis visualized on today's exam compared to the exam on 08-27-16.   Plan: Follow-up in 18 months with Carotid Duplex scan.    I discussed in depth with the patient the nature of atherosclerosis, and emphasized the importance of maximal medical management including strict control of blood pressure, blood glucose, and lipid levels, obtaining regular exercise, and continued cessation of smoking.  The patient is aware that without maximal medical management the underlying atherosclerotic disease process will progress, limiting the benefit of any interventions. The patient was given information about stroke prevention and what symptoms should prompt the patient to seek immediate medical care. Thank you for allowing [place] to participate in this patient's care.  Clemon Chambers, RN, MSN, FNP-C [facility] Office: [phone]  Clinic Physician: Early  09/02/17 1:53 PM

## 2017-09-02 NOTE — Patient Instructions (Signed)
Stroke Prevention Some medical conditions and behaviors are associated with an increased chance of having a stroke. You may prevent a stroke by making healthy choices and managing medical conditions. How can I reduce my risk of having a stroke?  Stay physically active. Get at least 30 minutes of activity on most or all days.  Do not smoke. It may also be helpful to avoid exposure to secondhand smoke.  Limit alcohol use. Moderate alcohol use is considered to be: ? No more than 2 drinks per day for men. ? No more than 1 drink per day for nonpregnant women.  Eat healthy foods. This involves: ? Eating 5 or more servings of fruits and vegetables a day. ? Making dietary changes that address high blood pressure (hypertension), high cholesterol, diabetes, or obesity.  Manage your cholesterol levels. ? Making food choices that are high in fiber and low in saturated fat, trans fat, and cholesterol may control cholesterol levels. ? Take any prescribed medicines to control cholesterol as directed by your health care provider.  Manage your diabetes. ? Controlling your carbohydrate and sugar intake is recommended to manage diabetes. ? Take any prescribed medicines to control diabetes as directed by your health care provider.  Control your hypertension. ? Making food choices that are low in salt (sodium), saturated fat, trans fat, and cholesterol is recommended to manage hypertension. ? Ask your health care provider if you need treatment to lower your blood pressure. Take any prescribed medicines to control hypertension as directed by your health care provider. ? If you are 18-39 years of age, have your blood pressure checked every 3-5 years. If you are 40 years of age or older, have your blood pressure checked every year.  Maintain a healthy weight. ? Reducing calorie intake and making food choices that are low in sodium, saturated fat, trans fat, and cholesterol are recommended to manage  weight.  Stop drug abuse.  Avoid taking birth control pills. ? Talk to your health care provider about the risks of taking birth control pills if you are over 35 years old, smoke, get migraines, or have ever had a blood clot.  Get evaluated for sleep disorders (sleep apnea). ? Talk to your health care provider about getting a sleep evaluation if you snore a lot or have excessive sleepiness.  Take medicines only as directed by your health care provider. ? For some people, aspirin or blood thinners (anticoagulants) are helpful in reducing the risk of forming abnormal blood clots that can lead to stroke. If you have the irregular heart rhythm of atrial fibrillation, you should be on a blood thinner unless there is a good reason you cannot take them. ? Understand all your medicine instructions.  Make sure that other conditions (such as anemia or atherosclerosis) are addressed. Get help right away if:  You have sudden weakness or numbness of the face, arm, or leg, especially on one side of the body.  Your face or eyelid droops to one side.  You have sudden confusion.  You have trouble speaking (aphasia) or understanding.  You have sudden trouble seeing in one or both eyes.  You have sudden trouble walking.  You have dizziness.  You have a loss of balance or coordination.  You have a sudden, severe headache with no known cause.  You have new chest pain or an irregular heartbeat. Any of these symptoms may represent a serious problem that is an emergency. Do not wait to see if the symptoms will go away.   Get medical help at once. Call your local emergency services (911 in [place] Do not drive yourself to the hospital. This information is not intended to replace advice given to you by your health care provider. Make sure you discuss any questions you have with your health care provider. Document Released: 01/02/2005 Document Revised: 05/02/2016 Document Reviewed: 05/28/2013 Elsevier  Interactive Patient Education  2017 Elsevier Inc.     Preventing Cerebrovascular Disease Arteries are blood vessels that carry blood that contains oxygen from the heart to all parts of the body. Cerebrovascular disease affects arteries that supply the brain. Any condition that blocks or disrupts blood flow to the brain can cause cerebrovascular disease. Brain cells that lose blood supply start to die within minutes (stroke). Stroke is the main danger of cerebrovascular disease. Atherosclerosis and high blood pressure are common causes of cerebrovascular disease. Atherosclerosis is narrowing and hardening of an artery that results when fat, cholesterol, calcium, or other substances (plaque) build up inside an artery. Plaque reduces blood flow through the artery. High blood pressure increases the risk of bleeding inside the brain. Making diet and lifestyle changes to prevent atherosclerosis and high blood pressure lowers your risk of cerebrovascular disease. What nutrition changes can be made?  Eat more fruits, vegetables, and whole grains.  Reduce how much saturated fat you eat. To do this, eat less red meat and fewer full-fat dairy products.  Eat healthy proteins instead of red meat. Healthy proteins include: ? Fish. Eat fish that contains heart-healthy omega-3 fatty acids, twice a week. Examples include salmon, albacore tuna, mackerel, and herring. ? Chicken. ? Nuts. ? Low-fat or nonfat yogurt.  Avoid processed meats, like bacon and lunchmeat.  Avoid foods that contain: ? A lot of sugar, such as sweets and drinks with added sugar. ? A lot of salt (sodium). Avoid adding extra salt to your food, as told by your health care provider. ? Trans fats, such as margarine and baked goods. Trans fats may be listed as "partially hydrogenated oils" on food labels.  Check food labels to see how much sodium, sugar, and trans fats are in foods.  Use vegetable oils that contain low amounts of  saturated fat, such as olive oil or canola oil. What lifestyle changes can be made?  Drink alcohol in moderation. This means no more than 1 drink a day for nonpregnant women and 2 drinks a day for men. One drink equals 12 oz of beer, 5 oz of wine, or 1 oz of hard liquor.  If you are overweight, ask your health care provider to recommend a weight-loss plan for you. Losing 5-10 lb (2.2-4.5 kg) can reduce your risk of diabetes, atherosclerosis, and high blood pressure.  Exercise for 30?60 minutes on most days, or as much as told by your health care provider. ? Do moderate-intensity exercise, such as brisk walking, bicycling, and water aerobics. Ask your health care provider which activities are safe for you.  Do not use any products that contain nicotine or tobacco, such as cigarettes and e-cigarettes. If you need help quitting, ask your health care provider. Why are these changes important? Making these changes lowers your risk of many diseases that can cause cerebrovascular disease and stroke. Stroke is a leading cause of death and disability. Making these changes also improves your overall health and quality of life. What can I do to lower my risk? The following factors make you more likely to develop cerebrovascular disease:  Being overweight.  Smoking.  Being physically inactive.    Eating a high-fat diet.  Having certain health conditions, such as: ? Diabetes. ? High blood pressure. ? Heart disease. ? Atherosclerosis. ? High cholesterol. ? Sickle cell disease.  Talk with your health care provider about your risk for cerebrovascular disease. Work with your health care provider to control diseases that you have that may contribute to cerebrovascular disease. Your health care provider may prescribe medicines to help prevent major causes of cerebrovascular disease. Where to find more information: Learn more about preventing cerebrovascular disease from:  National Heart, Lung, and  Blood Institute: www.nhlbi.nih.gov/health/health-topics/topics/stroke  Centers for Disease Control and Prevention: cdc.gov/stroke/about.htm  Summary  Cerebrovascular disease can lead to a stroke.  Atherosclerosis and high blood pressure are major causes of cerebrovascular disease.  Making diet and lifestyle changes can reduce your risk of cerebrovascular disease.  Work with your health care provider to get your risk factors under control to reduce your risk of cerebrovascular disease. This information is not intended to replace advice given to you by your health care provider. Make sure you discuss any questions you have with your health care provider. Document Released: 12/10/2015 Document Revised: 06/14/2016 Document Reviewed: 12/10/2015 Elsevier Interactive Patient Education  2018 Elsevier Inc.  

## 2018-01-20 ENCOUNTER — Other Ambulatory Visit: Payer: Self-pay | Admitting: Family Medicine

## 2018-01-20 DIAGNOSIS — I714 Abdominal aortic aneurysm, without rupture, unspecified: Secondary | ICD-10-CM

## 2018-01-30 ENCOUNTER — Ambulatory Visit
Admission: RE | Admit: 2018-01-30 | Discharge: 2018-01-30 | Disposition: A | Discharge status: Home / Self Care | Payer: Medicare Other | Source: Ambulatory Visit | Attending: Family Medicine | Admitting: Family Medicine

## 2018-01-30 DIAGNOSIS — I714 Abdominal aortic aneurysm, without rupture, unspecified: Secondary | ICD-10-CM

## 2020-02-06 ENCOUNTER — Ambulatory Visit: Payer: Medicare Other | Attending: Internal Medicine

## 2020-02-06 DIAGNOSIS — Z23 Encounter for immunization: Secondary | ICD-10-CM

## 2020-02-06 NOTE — Progress Notes (Signed)
   Covid-19 Vaccination Clinic  Name:  [name]    MRN: [id number] DOB: 03/13/1932  02/06/2020  [name] was observed post Covid-19 immunization for 15 minutes without incidence. She was provided with Vaccine Information Sheet and instruction to access the V-Safe system.   [name] was instructed to call 911 with any severe reactions post vaccine: Marland Kitchen Difficulty breathing  . Swelling of your face and throat  . A fast heartbeat  . A bad rash all over your body  . Dizziness and weakness    Immunizations Administered    Name Date Dose VIS Date Route   Pfizer COVID-19 Vaccine 02/06/2020  1:45 PM 0.3 mL 11/19/2019 Intramuscular   Manufacturer: [place]   Lot: [id number]   [place]: [id number]

## 2020-03-08 ENCOUNTER — Ambulatory Visit: Payer: Medicare Other | Attending: Internal Medicine

## 2020-03-08 DIAGNOSIS — Z23 Encounter for immunization: Secondary | ICD-10-CM

## 2020-03-08 NOTE — Progress Notes (Signed)
   Covid-19 Vaccination Clinic  Name:  OWEDA DANOFF    MRN: [id number] DOB: [birth year]  03/08/2020  Ms. Grandstaff was observed post Covid-19 immunization for 15 minutes without incident. She was provided with Vaccine Information Sheet and instruction to access the V-Safe system.   Ms. Bernthal was instructed to call 911 with any severe reactions post vaccine: Marland Kitchen Difficulty breathing  . Swelling of face and throat  . A fast heartbeat  . A bad rash all over body  . Dizziness and weakness   Immunizations Administered    Name Date Dose VIS Date Route   Pfizer COVID-19 Vaccine 03/08/2020  4:13 PM 0.3 mL 11/19/2019 Intramuscular   Manufacturer: [place]   Lot: [id number]   [place]: [id number]

## 2020-05-26 ENCOUNTER — Encounter (HOSPITAL_BASED_OUTPATIENT_CLINIC_OR_DEPARTMENT_OTHER): Payer: Self-pay | Admitting: *Deleted

## 2020-05-26 ENCOUNTER — Emergency Department (HOSPITAL_BASED_OUTPATIENT_CLINIC_OR_DEPARTMENT_OTHER)
Admission: EM | Admit: 2020-05-26 | Discharge: 2020-05-26 | Disposition: A | Discharge status: Home / Self Care | Payer: Medicare Other | Attending: Emergency Medicine | Admitting: Emergency Medicine

## 2020-05-26 ENCOUNTER — Other Ambulatory Visit: Payer: Self-pay

## 2020-05-26 DIAGNOSIS — R220 Localized swelling, mass and lump, head: Secondary | ICD-10-CM | POA: Diagnosis not present

## 2020-05-26 DIAGNOSIS — L723 Sebaceous cyst: Secondary | ICD-10-CM | POA: Diagnosis present

## 2020-05-26 DIAGNOSIS — Z79899 Other long term (current) drug therapy: Secondary | ICD-10-CM | POA: Insufficient documentation

## 2020-05-26 DIAGNOSIS — I10 Essential (primary) hypertension: Secondary | ICD-10-CM | POA: Diagnosis not present

## 2020-05-26 DIAGNOSIS — Z87891 Personal history of nicotine dependence: Secondary | ICD-10-CM | POA: Diagnosis not present

## 2020-05-26 MED ORDER — DOXYCYCLINE HYCLATE 100 MG PO TABS
100.0000 mg | ORAL_TABLET | Freq: Once | ORAL | Status: AC
Start: 2020-05-26 — End: 2020-05-26
  Administered 2020-05-26: 100 mg via ORAL
  Filled 2020-05-26: qty 1

## 2020-05-26 MED ORDER — ONDANSETRON 4 MG PO TBDP: 4.0000 mg | ORAL_TABLET | Freq: Three times a day (TID) | ORAL | 0 refills | Status: DC | PRN

## 2020-05-26 MED ORDER — LIDOCAINE-EPINEPHRINE-TETRACAINE (LET) TOPICAL GEL
3.0000 mL | Freq: Once | TOPICAL | Status: AC
  Administered 2020-05-26: 3 mL via TOPICAL
  Filled 2020-05-26: qty 3

## 2020-05-26 MED ORDER — DOXYCYCLINE HYCLATE 100 MG PO CAPS: 100.0000 mg | ORAL_CAPSULE | Freq: Two times a day (BID) | ORAL | 0 refills | Status: DC

## 2020-05-26 NOTE — ED Notes (Signed)
ED Provider at bedside. 

## 2020-05-26 NOTE — ED Provider Notes (Signed)
[facility] EMERGENCY DEPARTMENT Provider Note   CSN: [id number] Arrival date & time: 05/26/20  1733     History Chief Complaint  Patient presents with  . Cyst    [name] is a 86 y.o. female.  The history is provided by the patient and medical records. No language interpreter was used.   [name] is a 86 y.o. female who presents to the Emergency Department complaining of cyst. She presents the emergency department accompanied by her daughter for evaluation of pain and swelling to the right face. She developed a small knot on the right side of her face about three months ago. Over the last week she is experienced increased pain and enlarging of the cyst. Pain is present when she palpate the area and it radiates to her forehead. She denies any fevers, night sweats, sore throat, difficulty swallowing, changes in vision. She has an appointment scheduled with dermatology for evaluation but this is not until July 1. Symptoms are moderate, constant, worsening.    Past Medical History:  Diagnosis Date  . Arthritis   . Bowel perforation ([place]) 2012  . Cancer ([place])    colon  . Carotid artery occlusion   . Hyperlipidemia   . Hypertension     Patient Active Problem List   Diagnosis Date Noted  . Aftercare following surgery of the circulatory system, [place] 08/09/2014  . Occlusion and stenosis of carotid artery without mention of cerebral infarction 07/27/2013  . History of sigmoid colon cancer, T4, N0. Resected 04/03/2011. 10/30/2011    Past Surgical History:  Procedure Laterality Date  . ABDOMINAL HYSTERECTOMY    . CAROTID ENDARTERECTOMY Right 04-05-08   CE  . COLON SURGERY     colon resection with colostomy 03/2011  . COLONOSCOPY N/A 07/09/2013   Procedure: COLONOSCOPY;  Surgeon: Jeryl Columbia, MD;  Location: [facility];  Service: Endoscopy;  Laterality: N/A;  . COLOSTOMY    . COLOSTOMY TAKEDOWN  07/30/2012   Procedure: LAPAROSCOPIC COLOSTOMY TAKEDOWN;   Surgeon: Shann Medal, MD;  Location: [facility];  Service: General;  Laterality: N/A;  laparoscopic colostomy reversal plus bilateral ureteral stents  . HOT HEMOSTASIS N/A 07/09/2013   Procedure: HOT HEMOSTASIS (ARGON PLASMA COAGULATION/BICAP);  Surgeon: Jeryl Columbia, MD;  Location: [facility];  Service: Endoscopy;  Laterality: N/A;     OB History   No obstetric history on file.     Family History  Problem Relation Age of Onset  . Heart disease Mother        heart attack  . Cancer Sister        breast    Social History   Tobacco Use  . Smoking status: Former Smoker    Quit date: 12/24/1978    Years since quitting: 41.4  . Smokeless tobacco: Never Used  Substance Use Topics  . Alcohol use: Yes    Comment: glass of wine at christmas  . Drug use: No    Home Medications Prior to Admission medications   Medication Sig Start Date End Date Taking? Authorizing Provider  CALCIUM-VITAMIN D PO Take 1 tablet by mouth daily.    [provider]  Cyanocobalamin (VITAMIN B-12 CR PO) Take 1 tablet by mouth daily.    [provider]  doxycycline (VIBRAMYCIN) 100 MG capsule Take 1 capsule (100 mg total) by mouth 2 (two) times daily. 05/26/20   Quintella Reichert, MD  fluorometholone (FML) 0.1 % ophthalmic suspension  12/14/15   [provider]  HYDROcodone-acetaminophen (NORCO/VICODIN) 5-325 MG per tablet Take 1 tablet by mouth every 6 (six) hours as needed. 10/29/13   [provider]  ibuprofen (ADVIL,MOTRIN) 200 MG tablet Take 200 mg by mouth every 6 (six) hours as needed for pain.    [provider]  lisinopril (PRINIVIL,ZESTRIL) 10 MG tablet Take 10 mg by mouth daily after breakfast.    [provider]  ondansetron (ZOFRAN ODT) 4 MG disintegrating tablet Take 1 tablet (4 mg total) by mouth every 8 (eight) hours as needed for nausea or vomiting. 05/26/20   Quintella Reichert, MD  Polyethyl Glycol-Propyl Glycol (SYSTANE ULTRA) 0.4-0.3 % SOLN Place 1  drop into both eyes 2 (two) times daily as needed (dry eyes).     [provider]  rosuvastatin (CRESTOR) 10 MG tablet Take 10 mg by mouth every evening.    [provider]  tiZANidine (ZANAFLEX) 4 MG tablet Take 4 mg by mouth every 6 (six) hours as needed (pain).    [provider]    Allergies    Patient has no known allergies.  Review of Systems   Review of Systems  All other systems reviewed and are negative.   Physical Exam Updated Vital Signs BP (!) 157/81 (BP Location: Left Arm)   Pulse 67   Temp 99.5 F (37.5 C) (Oral)   Resp 18   Ht 5\' 5"  (1.651 m)   SpO2 99%   BMI 26.61 kg/m   Physical Exam Vitals and nursing note reviewed.  Constitutional:      Appearance: She is well-developed.  HENT:     Head: Normocephalic and atraumatic.     Comments: TMs clear bilaterally. Pupils equal round and reactive. EOM I. There is a well circumscribed 2 centimeter cyst to the right mid cheek. There is mild surrounding erythema. No tenderness or fullness over the parotid gland. No cervical lymphadenopathy.    Mouth/Throat:     Mouth: Mucous membranes are moist.     Pharynx: Oropharynx is clear.  Cardiovascular:     Rate and Rhythm: Normal rate and regular rhythm.  Pulmonary:     Effort: Pulmonary effort is normal. No respiratory distress.  Musculoskeletal:        General: No tenderness.     Cervical back: Neck supple. No tenderness.  Skin:    General: Skin is warm and dry.  Neurological:     Mental Status: She is alert and oriented to person, place, and time.     Comments: No asymmetry of facial movements.  Psychiatric:        Behavior: Behavior normal.     ED Results / Procedures / Treatments   Labs (all labs ordered are listed, but only abnormal results are displayed) Labs Reviewed - No data to display  EKG None  Radiology No results found.  Procedures .Marland KitchenIncision and Drainage  Date/Time: 05/27/2020 12:29 AM Performed by: Quintella Reichert, MD Authorized by: Quintella Reichert, MD   Consent:    Consent obtained:  Verbal   Consent given by:  Patient   Risks discussed:  Bleeding, incomplete drainage, pain and infection   Alternatives discussed:  Alternative treatment Location:    Type:  Cyst   Size:  2cm   Location:  Head   Head location:  Face Pre-procedure details:    Skin preparation:  Chloraprep Anesthesia (see MAR for exact dosages):    Anesthesia method:  Topical application   Topical anesthetic:  LET Procedure type:    Complexity:  Simple  Procedure details:    Needle aspiration: yes     Needle size:  18 G   Scalpel blade:  11   Drainage:  Purulent   Drainage amount:  Moderate   Wound treatment:  Wound left open   Packing materials:  None Post-procedure details:    Patient tolerance of procedure:  Tolerated well, no immediate complications   (including critical care time)  Medications Ordered in ED Medications  lidocaine-EPINEPHrine-tetracaine (LET) topical gel (3 mLs Topical Given by Other 05/26/20 1816)  doxycycline (VIBRA-TABS) tablet 100 mg (100 mg Oral Given 05/26/20 1924)    ED Course  I have reviewed the triage vital signs and the nursing notes.  Pertinent labs & imaging results that were available during my care of the patient were reviewed by me and considered in my medical decision making (see chart for details).    MDM Rules/Calculators/A&P                         patient here for evaluation of progressive swelling and pain to the right cheek.  Examination is c/w infected cyst.  Needle aspiration performed as well as stab incision.  Will start on abx with planned derm follow up. Return precautions discussed.   Final Clinical Impression(s) / ED Diagnoses Final diagnoses:  Infected sebaceous cyst of skin    Rx / DC Orders ED Discharge Orders         Ordered    doxycycline (VIBRAMYCIN) 100 MG capsule  2 times daily     Discontinue  Reprint     05/26/20 1904    ondansetron  (ZOFRAN ODT) 4 MG disintegrating tablet  Every 8 hours PRN     Discontinue  Reprint     05/26/20 1904           Quintella Reichert, MD 05/27/20 0031

## 2020-05-26 NOTE — ED Triage Notes (Signed)
Pt c/o cyst to right side of face x 3 months

## 2020-12-25 ENCOUNTER — Emergency Department (HOSPITAL_COMMUNITY): Payer: Medicare Other

## 2020-12-25 ENCOUNTER — Emergency Department (HOSPITAL_COMMUNITY): Payer: Medicare Other | Admitting: Certified Registered Nurse Anesthetist

## 2020-12-25 ENCOUNTER — Encounter (HOSPITAL_COMMUNITY): Payer: Self-pay

## 2020-12-25 ENCOUNTER — Encounter (HOSPITAL_COMMUNITY)
Admission: EM | Disposition: A | Discharge status: Skilled Nursing Facility | Payer: Self-pay | Source: Home / Self Care | Attending: Internal Medicine

## 2020-12-25 ENCOUNTER — Inpatient Hospital Stay (HOSPITAL_COMMUNITY)
Admission: EM | Admit: 2020-12-25 | Discharge: 2021-01-02 | DRG: 329 | Disposition: A | Discharge status: Skilled Nursing Facility | Payer: Medicare Other | Attending: Internal Medicine | Admitting: Internal Medicine

## 2020-12-25 DIAGNOSIS — I714 Abdominal aortic aneurysm, without rupture: Secondary | ICD-10-CM | POA: Diagnosis not present

## 2020-12-25 DIAGNOSIS — K631 Perforation of intestine (nontraumatic): Secondary | ICD-10-CM | POA: Diagnosis not present

## 2020-12-25 DIAGNOSIS — I739 Peripheral vascular disease, unspecified: Secondary | ICD-10-CM | POA: Diagnosis present

## 2020-12-25 DIAGNOSIS — I472 Ventricular tachycardia: Secondary | ICD-10-CM | POA: Diagnosis not present

## 2020-12-25 DIAGNOSIS — E43 Unspecified severe protein-calorie malnutrition: Secondary | ICD-10-CM | POA: Diagnosis present

## 2020-12-25 DIAGNOSIS — Z8249 Family history of ischemic heart disease and other diseases of the circulatory system: Secondary | ICD-10-CM | POA: Diagnosis not present

## 2020-12-25 DIAGNOSIS — M549 Dorsalgia, unspecified: Secondary | ICD-10-CM | POA: Diagnosis not present

## 2020-12-25 DIAGNOSIS — K5730 Diverticulosis of large intestine without perforation or abscess without bleeding: Secondary | ICD-10-CM | POA: Diagnosis not present

## 2020-12-25 DIAGNOSIS — R188 Other ascites: Secondary | ICD-10-CM | POA: Diagnosis present

## 2020-12-25 DIAGNOSIS — K265 Chronic or unspecified duodenal ulcer with perforation: Secondary | ICD-10-CM | POA: Diagnosis not present

## 2020-12-25 DIAGNOSIS — Z9049 Acquired absence of other specified parts of digestive tract: Secondary | ICD-10-CM | POA: Diagnosis not present

## 2020-12-25 DIAGNOSIS — Z85038 Personal history of other malignant neoplasm of large intestine: Secondary | ICD-10-CM | POA: Diagnosis not present

## 2020-12-25 DIAGNOSIS — G8929 Other chronic pain: Secondary | ICD-10-CM | POA: Diagnosis not present

## 2020-12-25 DIAGNOSIS — R0689 Other abnormalities of breathing: Secondary | ICD-10-CM | POA: Diagnosis not present

## 2020-12-25 DIAGNOSIS — K659 Peritonitis, unspecified: Secondary | ICD-10-CM | POA: Diagnosis not present

## 2020-12-25 DIAGNOSIS — Z6821 Body mass index (BMI) 21.0-21.9, adult: Secondary | ICD-10-CM | POA: Diagnosis not present

## 2020-12-25 DIAGNOSIS — F17200 Nicotine dependence, unspecified, uncomplicated: Secondary | ICD-10-CM | POA: Diagnosis present

## 2020-12-25 DIAGNOSIS — I10 Essential (primary) hypertension: Secondary | ICD-10-CM | POA: Diagnosis not present

## 2020-12-25 DIAGNOSIS — K6389 Other specified diseases of intestine: Secondary | ICD-10-CM | POA: Diagnosis not present

## 2020-12-25 DIAGNOSIS — Z79899 Other long term (current) drug therapy: Secondary | ICD-10-CM | POA: Diagnosis not present

## 2020-12-25 DIAGNOSIS — M1990 Unspecified osteoarthritis, unspecified site: Secondary | ICD-10-CM | POA: Diagnosis not present

## 2020-12-25 DIAGNOSIS — D62 Acute posthemorrhagic anemia: Secondary | ICD-10-CM | POA: Diagnosis not present

## 2020-12-25 DIAGNOSIS — Z7982 Long term (current) use of aspirin: Secondary | ICD-10-CM

## 2020-12-25 DIAGNOSIS — E785 Hyperlipidemia, unspecified: Secondary | ICD-10-CM | POA: Diagnosis not present

## 2020-12-25 DIAGNOSIS — I499 Cardiac arrhythmia, unspecified: Secondary | ICD-10-CM | POA: Diagnosis not present

## 2020-12-25 DIAGNOSIS — Z7983 Long term (current) use of bisphosphonates: Secondary | ICD-10-CM

## 2020-12-25 DIAGNOSIS — N179 Acute kidney failure, unspecified: Secondary | ICD-10-CM | POA: Diagnosis not present

## 2020-12-25 DIAGNOSIS — K668 Other specified disorders of peritoneum: Secondary | ICD-10-CM | POA: Diagnosis not present

## 2020-12-25 DIAGNOSIS — R109 Unspecified abdominal pain: Secondary | ICD-10-CM | POA: Diagnosis not present

## 2020-12-25 DIAGNOSIS — K650 Generalized (acute) peritonitis: Secondary | ICD-10-CM | POA: Diagnosis not present

## 2020-12-25 DIAGNOSIS — R6339 Other feeding difficulties: Secondary | ICD-10-CM | POA: Diagnosis not present

## 2020-12-25 DIAGNOSIS — M810 Age-related osteoporosis without current pathological fracture: Secondary | ICD-10-CM | POA: Diagnosis present

## 2020-12-25 DIAGNOSIS — R9431 Abnormal electrocardiogram [ECG] [EKG]: Secondary | ICD-10-CM | POA: Diagnosis not present

## 2020-12-25 DIAGNOSIS — I493 Ventricular premature depolarization: Secondary | ICD-10-CM | POA: Diagnosis not present

## 2020-12-25 DIAGNOSIS — R6889 Other general symptoms and signs: Secondary | ICD-10-CM | POA: Diagnosis not present

## 2020-12-25 DIAGNOSIS — Z743 Need for continuous supervision: Secondary | ICD-10-CM | POA: Diagnosis not present

## 2020-12-25 DIAGNOSIS — Z66 Do not resuscitate: Secondary | ICD-10-CM | POA: Diagnosis present

## 2020-12-25 DIAGNOSIS — Z20822 Contact with and (suspected) exposure to covid-19: Secondary | ICD-10-CM | POA: Diagnosis present

## 2020-12-25 DIAGNOSIS — K261 Acute duodenal ulcer with perforation: Secondary | ICD-10-CM | POA: Diagnosis not present

## 2020-12-25 DIAGNOSIS — E860 Dehydration: Secondary | ICD-10-CM | POA: Diagnosis not present

## 2020-12-25 HISTORY — PX: LAPAROTOMY: SHX154

## 2020-12-25 HISTORY — PX: REPAIR OF PERFORATED ULCER: SHX6065

## 2020-12-25 HISTORY — PX: JEJUNOSTOMY: SHX313

## 2020-12-25 LAB — I-STAT CHEM 8, ED
BUN: 42 mg/dL — ABNORMAL HIGH (ref 8–23)
Calcium, Ion: 1.05 mmol/L — ABNORMAL LOW (ref 1.15–1.40)
Chloride: 107 mmol/L (ref 98–111)
Creatinine, Ser: 2 mg/dL — ABNORMAL HIGH (ref 0.44–1.00)
Glucose, Bld: 155 mg/dL — ABNORMAL HIGH (ref 70–99)
HCT: 40 % (ref 36.0–46.0)
Hemoglobin: 13.6 g/dL (ref 12.0–15.0)
Potassium: 4.6 mmol/L (ref 3.5–5.1)
Sodium: 139 mmol/L (ref 135–145)
TCO2: 20 mmol/L — ABNORMAL LOW (ref 22–32)

## 2020-12-25 LAB — COMPREHENSIVE METABOLIC PANEL
ALT: 10 U/L (ref 0–44)
AST: 16 U/L (ref 15–41)
Albumin: 3.6 g/dL (ref 3.5–5.0)
Alkaline Phosphatase: 47 U/L (ref 38–126)
Anion gap: 15 (ref 5–15)
BUN: 41 mg/dL — ABNORMAL HIGH (ref 8–23)
CO2: 18 mmol/L — ABNORMAL LOW (ref 22–32)
Calcium: 8.2 mg/dL — ABNORMAL LOW (ref 8.9–10.3)
Chloride: 104 mmol/L (ref 98–111)
Creatinine, Ser: 2.14 mg/dL — ABNORMAL HIGH (ref 0.44–1.00)
GFR, Estimated: 22 mL/min — ABNORMAL LOW (ref >60)
Glucose, Bld: 159 mg/dL — ABNORMAL HIGH (ref 70–99)
Potassium: 4.5 mmol/L (ref 3.5–5.1)
Sodium: 137 mmol/L (ref 135–145)
Total Bilirubin: 0.9 mg/dL (ref 0.3–1.2)
Total Protein: 6.4 g/dL — ABNORMAL LOW (ref 6.5–8.1)

## 2020-12-25 LAB — CBC WITH DIFFERENTIAL/PLATELET
Abs Immature Granulocytes: 0.05 10*3/uL (ref 0.00–0.07)
Basophils Absolute: 0 10*3/uL (ref 0.0–0.1)
Basophils Relative: 0 %
Eosinophils Absolute: 0 10*3/uL (ref 0.0–0.5)
Eosinophils Relative: 0 %
HCT: 39.1 % (ref 36.0–46.0)
Hemoglobin: 12.7 g/dL (ref 12.0–15.0)
Immature Granulocytes: 0 %
Lymphocytes Relative: 10 %
Lymphs Abs: 1.6 10*3/uL (ref 0.7–4.0)
MCH: 31.3 pg (ref 26.0–34.0)
MCHC: 32.5 g/dL (ref 30.0–36.0)
MCV: 96.3 fL (ref 80.0–100.0)
Monocytes Absolute: 0.3 10*3/uL (ref 0.1–1.0)
Monocytes Relative: 2 %
Neutro Abs: 14.3 10*3/uL — ABNORMAL HIGH (ref 1.7–7.7)
Neutrophils Relative %: 88 %
Platelets: 220 10*3/uL (ref 150–400)
RBC: 4.06 MIL/uL (ref 3.87–5.11)
RDW: 12.2 % (ref 11.5–15.5)
WBC: 16.2 10*3/uL — ABNORMAL HIGH (ref 4.0–10.5)
nRBC: 0 % (ref 0.0–0.2)

## 2020-12-25 LAB — LACTIC ACID, PLASMA
Lactic Acid, Venous: 1.7 mmol/L (ref 0.5–1.9)
Lactic Acid, Venous: 1.3 mmol/L (ref 0.5–1.9)

## 2020-12-25 LAB — MAGNESIUM: Magnesium: 1.7 mg/dL (ref 1.7–2.4)

## 2020-12-25 LAB — SARS CORONAVIRUS 2 BY RT PCR (HOSPITAL ORDER, PERFORMED IN ~~LOC~~ HOSPITAL LAB): SARS Coronavirus 2: NEGATIVE

## 2020-12-25 LAB — PHOSPHORUS: Phosphorus: 3.2 mg/dL (ref 2.5–4.6)

## 2020-12-25 SURGERY — LAPAROTOMY, EXPLORATORY
Anesthesia: General | Site: Abdomen

## 2020-12-25 MED ORDER — ACETAMINOPHEN 10 MG/ML IV SOLN
INTRAVENOUS | Status: AC
  Filled 2020-12-25: qty 100

## 2020-12-25 MED ORDER — LIDOCAINE 2% (20 MG/ML) 5 ML SYRINGE
INTRAMUSCULAR | Status: DC | PRN
  Administered 2020-12-25: 60 mg via INTRAVENOUS

## 2020-12-25 MED ORDER — FENTANYL CITRATE (PF) 250 MCG/5ML IJ SOLN
INTRAMUSCULAR | Status: AC
  Filled 2020-12-25: qty 5

## 2020-12-25 MED ORDER — ONDANSETRON HCL 4 MG/2ML IJ SOLN
4.0000 mg | Freq: Four times a day (QID) | INTRAMUSCULAR | Status: DC | PRN
  Administered 2020-12-30 – 2021-01-02 (×5): 4 mg via INTRAVENOUS
  Filled 2020-12-25 (×5): qty 2

## 2020-12-25 MED ORDER — LIDOCAINE 2% (20 MG/ML) 5 ML SYRINGE
INTRAMUSCULAR | Status: AC
  Filled 2020-12-25: qty 5

## 2020-12-25 MED ORDER — SUCCINYLCHOLINE CHLORIDE 200 MG/10ML IV SOSY
PREFILLED_SYRINGE | INTRAVENOUS | Status: DC | PRN
  Administered 2020-12-25: 100 mg via INTRAVENOUS

## 2020-12-25 MED ORDER — HEPARIN SODIUM (PORCINE) 5000 UNIT/ML IJ SOLN: 5000.0000 [IU] | Freq: Two times a day (BID) | INTRAMUSCULAR | Status: DC

## 2020-12-25 MED ORDER — ONDANSETRON HCL 4 MG/2ML IJ SOLN
INTRAMUSCULAR | Status: DC | PRN
  Administered 2020-12-25: 4 mg via INTRAVENOUS

## 2020-12-25 MED ORDER — PROPOFOL 10 MG/ML IV BOLUS
INTRAVENOUS | Status: AC
  Filled 2020-12-25: qty 20

## 2020-12-25 MED ORDER — LABETALOL HCL 5 MG/ML IV SOLN
10.0000 mg | INTRAVENOUS | Status: DC | PRN
  Administered 2020-12-29: 10 mg via INTRAVENOUS
  Filled 2020-12-25: qty 4

## 2020-12-25 MED ORDER — FLUCONAZOLE IN SODIUM CHLORIDE 200-0.9 MG/100ML-% IV SOLN
200.0000 mg | INTRAVENOUS | Status: AC
  Administered 2020-12-25 – 2020-12-30 (×6): 200 mg via INTRAVENOUS
  Filled 2020-12-25 (×6): qty 100

## 2020-12-25 MED ORDER — PROPOFOL 10 MG/ML IV BOLUS
INTRAVENOUS | Status: DC | PRN
  Administered 2020-12-25: 120 mg via INTRAVENOUS

## 2020-12-25 MED ORDER — ARTIFICIAL TEARS OPHTHALMIC OINT
TOPICAL_OINTMENT | OPHTHALMIC | Status: AC
  Filled 2020-12-25: qty 3.5

## 2020-12-25 MED ORDER — HYDROMORPHONE HCL 1 MG/ML IJ SOLN
0.5000 mg | Freq: Once | INTRAMUSCULAR | Status: AC | PRN
  Administered 2020-12-25: 0.5 mg via INTRAVENOUS
  Filled 2020-12-25: qty 1

## 2020-12-25 MED ORDER — ONDANSETRON HCL 4 MG/2ML IJ SOLN
4.0000 mg | INTRAMUSCULAR | Status: AC
  Administered 2020-12-25: 4 mg via INTRAVENOUS
  Filled 2020-12-25: qty 2

## 2020-12-25 MED ORDER — LACTATED RINGERS IV SOLN: INTRAVENOUS | Status: DC | PRN

## 2020-12-25 MED ORDER — FENTANYL CITRATE (PF) 100 MCG/2ML IJ SOLN
25.0000 ug | INTRAMUSCULAR | Status: DC | PRN
  Administered 2020-12-25 (×2): 25 ug via INTRAVENOUS

## 2020-12-25 MED ORDER — FENTANYL CITRATE (PF) 250 MCG/5ML IJ SOLN
INTRAMUSCULAR | Status: DC | PRN
  Administered 2020-12-25 (×5): 50 ug via INTRAVENOUS

## 2020-12-25 MED ORDER — SODIUM CHLORIDE 0.9 % IV SOLN: INTRAVENOUS | Status: DC | PRN

## 2020-12-25 MED ORDER — SUGAMMADEX SODIUM 200 MG/2ML IV SOLN
INTRAVENOUS | Status: DC | PRN
  Administered 2020-12-25: 200 mg via INTRAVENOUS

## 2020-12-25 MED ORDER — ONDANSETRON HCL 4 MG/2ML IJ SOLN
INTRAMUSCULAR | Status: AC
  Filled 2020-12-25: qty 2

## 2020-12-25 MED ORDER — PIPERACILLIN-TAZOBACTAM 3.375 G IVPB
3.3750 g | Freq: Three times a day (TID) | INTRAVENOUS | Status: DC
  Administered 2020-12-25 – 2020-12-26 (×3): 3.375 g via INTRAVENOUS
  Filled 2020-12-25 (×4): qty 50

## 2020-12-25 MED ORDER — HYDROMORPHONE HCL 1 MG/ML IJ SOLN: 0.5000 mg | Freq: Once | INTRAMUSCULAR | Status: DC

## 2020-12-25 MED ORDER — ONDANSETRON HCL 4 MG/2ML IJ SOLN: 4.0000 mg | Freq: Once | INTRAMUSCULAR | Status: DC | PRN

## 2020-12-25 MED ORDER — SODIUM CHLORIDE 0.9 % IV SOLN: INTRAVENOUS | Status: AC

## 2020-12-25 MED ORDER — ROCURONIUM BROMIDE 10 MG/ML (PF) SYRINGE
PREFILLED_SYRINGE | INTRAVENOUS | Status: DC | PRN
  Administered 2020-12-25: 50 mg via INTRAVENOUS

## 2020-12-25 MED ORDER — ROSUVASTATIN CALCIUM 5 MG PO TABS
10.0000 mg | ORAL_TABLET | Freq: Every evening | ORAL | Status: DC
  Administered 2020-12-29 – 2021-01-02 (×5): 10 mg via ORAL
  Filled 2020-12-25 (×7): qty 2

## 2020-12-25 MED ORDER — SODIUM CHLORIDE 0.9 % IV SOLN
8.0000 mg/h | INTRAVENOUS | Status: DC
  Filled 2020-12-25: qty 80

## 2020-12-25 MED ORDER — SODIUM CHLORIDE 0.9 % IV SOLN: Freq: Once | INTRAVENOUS | Status: AC

## 2020-12-25 MED ORDER — ONDANSETRON HCL 4 MG PO TABS: 4.0000 mg | ORAL_TABLET | Freq: Four times a day (QID) | ORAL | Status: DC | PRN

## 2020-12-25 MED ORDER — POLYVINYL ALCOHOL 1.4 % OP SOLN: 1.0000 [drp] | Freq: Two times a day (BID) | OPHTHALMIC | Status: DC | PRN

## 2020-12-25 MED ORDER — VANCOMYCIN HCL 1250 MG/250ML IV SOLN
1250.0000 mg | Freq: Once | INTRAVENOUS | Status: AC
  Administered 2020-12-25: 1250 mg via INTRAVENOUS
  Filled 2020-12-25: qty 250

## 2020-12-25 MED ORDER — SUCCINYLCHOLINE CHLORIDE 200 MG/10ML IV SOSY
PREFILLED_SYRINGE | INTRAVENOUS | Status: AC
  Filled 2020-12-25: qty 20

## 2020-12-25 MED ORDER — EPHEDRINE 5 MG/ML INJ
INTRAVENOUS | Status: AC
  Filled 2020-12-25: qty 10

## 2020-12-25 MED ORDER — ROCURONIUM BROMIDE 10 MG/ML (PF) SYRINGE
PREFILLED_SYRINGE | INTRAVENOUS | Status: AC
  Filled 2020-12-25: qty 20

## 2020-12-25 MED ORDER — PHENYLEPHRINE 40 MCG/ML (10ML) SYRINGE FOR IV PUSH (FOR BLOOD PRESSURE SUPPORT)
PREFILLED_SYRINGE | INTRAVENOUS | Status: AC
  Filled 2020-12-25: qty 10

## 2020-12-25 MED ORDER — ALBUMIN HUMAN 5 % IV SOLN: INTRAVENOUS | Status: DC | PRN

## 2020-12-25 MED ORDER — POLYETHYL GLYCOL-PROPYL GLYCOL 0.4-0.3 % OP SOLN: 1.0000 [drp] | Freq: Two times a day (BID) | OPHTHALMIC | Status: DC | PRN

## 2020-12-25 MED ORDER — FENTANYL CITRATE (PF) 100 MCG/2ML IJ SOLN
INTRAMUSCULAR | Status: AC
  Filled 2020-12-25: qty 2

## 2020-12-25 MED ORDER — SODIUM CHLORIDE 0.9 % IV BOLUS
1000.0000 mL | Freq: Once | INTRAVENOUS | Status: AC
  Administered 2020-12-25: 1000 mL via INTRAVENOUS

## 2020-12-25 MED ORDER — VANCOMYCIN VARIABLE DOSE PER UNSTABLE RENAL FUNCTION (PHARMACIST DOSING): Status: DC

## 2020-12-25 MED ORDER — PHENYLEPHRINE 40 MCG/ML (10ML) SYRINGE FOR IV PUSH (FOR BLOOD PRESSURE SUPPORT)
PREFILLED_SYRINGE | INTRAVENOUS | Status: DC | PRN
  Administered 2020-12-25: 160 ug via INTRAVENOUS

## 2020-12-25 MED ORDER — HYDROMORPHONE HCL 1 MG/ML IJ SOLN
0.5000 mg | INTRAMUSCULAR | Status: DC | PRN
Start: 2020-12-25 — End: 2021-01-01
  Administered 2020-12-25 – 2020-12-28 (×5): 0.5 mg via INTRAVENOUS
  Filled 2020-12-25 (×4): qty 0.5
  Filled 2020-12-25: qty 1

## 2020-12-25 MED ORDER — EPHEDRINE SULFATE-NACL 50-0.9 MG/10ML-% IV SOSY
PREFILLED_SYRINGE | INTRAVENOUS | Status: DC | PRN
  Administered 2020-12-25: 5 mg via INTRAVENOUS

## 2020-12-25 MED ORDER — 0.9 % SODIUM CHLORIDE (POUR BTL) OPTIME
TOPICAL | Status: DC | PRN
  Administered 2020-12-25: 1000 mL
  Administered 2020-12-25: 2000 mL

## 2020-12-25 MED ORDER — HEPARIN SODIUM (PORCINE) 5000 UNIT/ML IJ SOLN
5000.0000 [IU] | Freq: Three times a day (TID) | INTRAMUSCULAR | Status: DC
  Administered 2020-12-26 – 2021-01-02 (×23): 5000 [IU] via SUBCUTANEOUS
  Filled 2020-12-25 (×24): qty 1

## 2020-12-25 MED ORDER — FLUOROMETHOLONE 0.1 % OP SUSP: 1.0000 [drp] | Freq: Three times a day (TID) | OPHTHALMIC | Status: DC

## 2020-12-25 MED ORDER — PIPERACILLIN-TAZOBACTAM 3.375 G IVPB 30 MIN
3.3750 g | Freq: Once | INTRAVENOUS | Status: AC
  Administered 2020-12-25: 3.375 g via INTRAVENOUS
  Filled 2020-12-25: qty 50

## 2020-12-25 MED ORDER — ACETAMINOPHEN 10 MG/ML IV SOLN
INTRAVENOUS | Status: DC | PRN
  Administered 2020-12-25: 1000 mg via INTRAVENOUS

## 2020-12-25 SURGICAL SUPPLY — 47 items
APL PRP STRL LF DISP 70% ISPRP (MISCELLANEOUS) ×2
BIOPATCH RED 1 DISK 7.0 (GAUZE/BANDAGES/DRESSINGS) ×3 IMPLANT
BIOPATCH RED 1IN DISK 7.0MM (GAUZE/BANDAGES/DRESSINGS) ×1
CATH GASTROSTOMY 20FR (CATHETERS) IMPLANT
CHLORAPREP W/TINT 26 (MISCELLANEOUS) ×4 IMPLANT
COVER SURGICAL LIGHT HANDLE (MISCELLANEOUS) ×4 IMPLANT
COVER WAND RF STERILE (DRAPES) ×4 IMPLANT
DRAIN CHANNEL 19F RND (DRAIN) ×4 IMPLANT
DRAPE INCISE IOBAN 66X45 STRL (DRAPES) ×4 IMPLANT
DRAPE LAPAROSCOPIC ABDOMINAL (DRAPES) ×4 IMPLANT
DRAPE WARM FLUID 44X44 (DRAPES) ×4 IMPLANT
DRSG OPSITE POSTOP 4X10 (GAUZE/BANDAGES/DRESSINGS) ×4 IMPLANT
DRSG OPSITE POSTOP 4X8 (GAUZE/BANDAGES/DRESSINGS) IMPLANT
DRSG TEGADERM 2-3/8X2-3/4 SM (GAUZE/BANDAGES/DRESSINGS) ×4 IMPLANT
ELECT BLADE 6.5 EXT (BLADE) ×4 IMPLANT
ELECT CAUTERY BLADE 6.4 (BLADE) ×4 IMPLANT
ELECT REM PT RETURN 9FT ADLT (ELECTROSURGICAL) ×4
ELECTRODE REM PT RTRN 9FT ADLT (ELECTROSURGICAL) ×2 IMPLANT
EVACUATOR SILICONE 100CC (DRAIN) ×4 IMPLANT
GLOVE BIOGEL PI IND STRL 6 (GLOVE) ×2 IMPLANT
GLOVE BIOGEL PI INDICATOR 6 (GLOVE) ×2
GLOVE BIOGEL PI MICRO 5.5 (GLOVE) ×2
GLOVE BIOGEL PI MICRO STRL 5.5 (GLOVE) ×2 IMPLANT
GOWN STRL REUS W/ TWL LRG LVL3 (GOWN DISPOSABLE) ×4 IMPLANT
GOWN STRL REUS W/TWL LRG LVL3 (GOWN DISPOSABLE) ×8
HANDLE SUCTION POOLE (INSTRUMENTS) ×2 IMPLANT
KIT BASIN OR (CUSTOM PROCEDURE TRAY) ×4 IMPLANT
KIT TURNOVER KIT B (KITS) ×4 IMPLANT
LIGASURE IMPACT 36 18CM CVD LR (INSTRUMENTS) IMPLANT
NS IRRIG 1000ML POUR BTL (IV SOLUTION) ×8 IMPLANT
PACK GENERAL/GYN (CUSTOM PROCEDURE TRAY) ×4 IMPLANT
PAD ARMBOARD 7.5X6 YLW CONV (MISCELLANEOUS) ×8 IMPLANT
PENCIL SMOKE EVACUATOR (MISCELLANEOUS) ×4 IMPLANT
SPECIMEN JAR LARGE (MISCELLANEOUS) IMPLANT
SPONGE LAP 18X18 RF (DISPOSABLE) ×4 IMPLANT
STAPLER VISISTAT 35W (STAPLE) ×4 IMPLANT
SUCTION POOLE HANDLE (INSTRUMENTS) ×4
SUT ETHILON 2 0 FS 18 (SUTURE) ×8 IMPLANT
SUT PDS AB 1 TP1 96 (SUTURE) ×8 IMPLANT
SUT SILK 2 0 SH CR/8 (SUTURE) ×4 IMPLANT
SUT SILK 2 0 TIES 10X30 (SUTURE) ×4 IMPLANT
SUT SILK 3 0 SH CR/8 (SUTURE) ×8 IMPLANT
SUT SILK 3 0 TIES 10X30 (SUTURE) ×4 IMPLANT
SYR 10ML LL (SYRINGE) ×4 IMPLANT
TOWEL GREEN STERILE (TOWEL DISPOSABLE) ×4 IMPLANT
TRAY FOLEY MTR SLVR 16FR STAT (SET/KITS/TRAYS/PACK) ×4 IMPLANT
YANKAUER SUCT BULB TIP NO VENT (SUCTIONS) ×4 IMPLANT

## 2020-12-25 NOTE — Consult Note (Signed)
Consult Note  [name] 05/27/1932  ON:[id number].    Requesting MD: Dr. Alvino Chapel  Chief Complaint/Reason for Consult: Perforated Duodenum  HPI: [name] is a 87 y.o. with a history of HTN, HLD, and remote hx of colon CA who presents with abdominal pain. Patient reports over the last few months she has been having mild, generalized abdominal pain. She has been taking Mobic for her symptoms without relief. This morning she had acute onset of upper abdominal pain that was worse on the right side. Pain is severe and constant. She notes associated n/v. She denies hx of similar symptoms in the past. She did not try anything for this at home.   Patient was afebrile in the ED without tachycardia or hypotension. WBC 16.2. CT A/P showed pneumoperitoneum with small volume ascites predominant long the right pericolic gutter - findings are concerning for duodenal perforation. We were asked to see. Labs also concerning for AKI with Cr of 2.14 from baseline of 0.69.  Patient has hx of Hartmann's by Dr. Lucia Gaskins in 2012 followed by reversal in 2013. Patient has also had a hysterectomy. She is on ASA daily. No other blood thinners.   Patient smokes daily. Lives at home. Mobilizes without assistance. Daughter at bedside.   ROS: Review of Systems  Constitutional: Negative for chills and fever.  Respiratory: Positive for shortness of breath. Negative for cough.   Cardiovascular: Positive for chest pain.  Gastrointestinal: Positive for abdominal pain, nausea and vomiting.  Musculoskeletal: Negative for back pain.  Psychiatric/Behavioral: Negative for substance abuse.  All other systems reviewed and are negative.   Family History  Problem Relation Age of Onset  . Heart disease Mother        heart attack  . Cancer Sister        breast    Past Medical History:  Diagnosis Date  . Arthritis   . Bowel perforation ([place]) 2012  . Cancer ([place])    colon  . Carotid artery occlusion    . Hyperlipidemia   . Hypertension     Past Surgical History:  Procedure Laterality Date  . ABDOMINAL HYSTERECTOMY    . CAROTID ENDARTERECTOMY Right 04-05-08   CE  . COLON SURGERY     colon resection with colostomy 03/2011  . COLONOSCOPY N/A 07/09/2013   Procedure: COLONOSCOPY;  Surgeon: Jeryl Columbia, MD;  Location: [facility]COPY;  Service: Endoscopy;  Laterality: N/A;  . COLOSTOMY    . COLOSTOMY TAKEDOWN  07/30/2012   Procedure: LAPAROSCOPIC COLOSTOMY TAKEDOWN;  Surgeon: Shann Medal, MD;  Location: [facility] ORS;  Service: General;  Laterality: N/A;  laparoscopic colostomy reversal plus bilateral ureteral stents  . HOT HEMOSTASIS N/A 07/09/2013   Procedure: HOT HEMOSTASIS (ARGON PLASMA COAGULATION/BICAP);  Surgeon: Jeryl Columbia, MD;  Location: [facility];  Service: Endoscopy;  Laterality: N/A;    Social History:  reports that she quit smoking about 42 years ago. She has never used smokeless tobacco. She reports current alcohol use. She reports that she does not use drugs.  Allergies: No Known Allergies  (Not in a hospital admission)   Blood pressure (!) 190/75, pulse 90, temperature 98.3 F (36.8 C), temperature source Oral, resp. rate 16, SpO2 95 %. Physical Exam:  General: pleasant, frail elderly female who is laying in bed in NAD HEENT: head is normocephalic, atraumatic.  Sclera are noninjected.  PERRL.  Ears and nose without any masses or lesions.  Mouth is pink and moist Heart:  regular, rate, and rhythm. No obvious murmurs noted.  Palpable radial pulses bilaterally Lungs: CTAB, no wheezes, rhonchi, or rales noted.  Respiratory effort nonlabored Abd: soft, mild distension, tenderness of the upper abdomen, hypoactive BS. No masses, hernias, or organomegaly. Prior midline and colostomy scar well healed.  MS:  all 4 extremities are symmetrical with no cyanosis, clubbing, or edema. Skin: warm and dry with no masses, lesions, or rashes Neuro: Cranial nerves 2-12 grossly intact, speech is  normal, moves all extremities. Gait not assessed. Psych: A&Ox2 with an appropriate affect.   Results for orders placed or performed during the hospital encounter of 12/25/20 (from the past 48 hour(s))  Lactic acid, plasma     Status: None   Collection Time: 12/25/20 12:40 PM  Result Value Ref Range   Lactic Acid, Venous 1.7 0.5 - 1.9 mmol/L    Comment: Performed at [facility], [address] [place], [place] [address]  CBC with Differential     Status: Abnormal   Collection Time: 12/25/20 12:50 PM  Result Value Ref Range   WBC 16.2 (H) 4.0 - 10.5 K/uL   RBC 4.06 3.87 - 5.11 MIL/uL   Hemoglobin 12.7 12.0 - 15.0 g/dL   HCT 39.1 36.0 - 46.0 %   MCV 96.3 80.0 - 100.0 fL   MCH 31.3 26.0 - 34.0 pg   MCHC 32.5 30.0 - 36.0 g/dL   RDW 12.2 11.5 - 15.5 %   Platelets 220 150 - 400 K/uL   nRBC 0.0 0.0 - 0.2 %   Neutrophils Relative % 88 %   Neutro Abs 14.3 (H) 1.7 - 7.7 K/uL   Lymphocytes Relative 10 %   Lymphs Abs 1.6 0.7 - 4.0 K/uL   Monocytes Relative 2 %   Monocytes Absolute 0.3 0.1 - 1.0 K/uL   Eosinophils Relative 0 %   Eosinophils Absolute 0.0 0.0 - 0.5 K/uL   Basophils Relative 0 %   Basophils Absolute 0.0 0.0 - 0.1 K/uL   Immature Granulocytes 0 %   Abs Immature Granulocytes 0.05 0.00 - 0.07 K/uL    Comment: Performed at [facility] [address] [place], [place] [address]  Comprehensive metabolic panel     Status: Abnormal   Collection Time: 12/25/20 12:50 PM  Result Value Ref Range   Sodium 137 135 - 145 mmol/L   Potassium 4.5 3.5 - 5.1 mmol/L   Chloride 104 98 - 111 mmol/L   CO2 18 (L) 22 - 32 mmol/L   Glucose, Bld 159 (H) 70 - 99 mg/dL    Comment: Glucose reference range applies only to samples taken after fasting for at least 8 hours.   BUN 41 (H) 8 - 23 mg/dL   Creatinine, Ser 2.14 (H) 0.44 - 1.00 mg/dL   Calcium 8.2 (L) 8.9 - 10.3 mg/dL   Total Protein 6.4 (L) 6.5 - 8.1 g/dL   Albumin 3.6 3.5 - 5.0 g/dL   AST 16 15 - 41 U/L   ALT 10 0 - 44 U/L    Alkaline Phosphatase 47 38 - 126 U/L   Total Bilirubin 0.9 0.3 - 1.2 mg/dL   GFR, Estimated 22 (L) >60 mL/min    Comment: (NOTE) Calculated using the CKD-EPI Creatinine Equation (2021)    Anion gap 15 5 - 15    Comment: Performed at [facility], [place] [address], [place], [place] [address]  I-stat chem 8, ED (not at [facility] or [facility])  Status: Abnormal   Collection Time: 12/25/20 12:55 PM  Result Value Ref Range   Sodium 139 135 - 145 mmol/L   Potassium 4.6 3.5 - 5.1 mmol/L   Chloride 107 98 - 111 mmol/L   BUN 42 (H) 8 - 23 mg/dL   Creatinine, Ser 2.00 (H) 0.44 - 1.00 mg/dL   Glucose, Bld 155 (H) 70 - 99 mg/dL    Comment: Glucose reference range applies only to samples taken after fasting for at least 8 hours.   Calcium, Ion 1.05 (L) 1.15 - 1.40 mmol/L   TCO2 20 (L) 22 - 32 mmol/L   Hemoglobin 13.6 12.0 - 15.0 g/dL   HCT 40.0 36.0 - 46.0 %   CT ABDOMEN PELVIS WO CONTRAST  Result Date: 12/25/2020 CLINICAL DATA:  Acute abdominal pain nonlocalized EXAM: CT ABDOMEN AND PELVIS WITHOUT CONTRAST TECHNIQUE: Multidetector CT imaging of the abdomen and pelvis was performed following the standard protocol without IV contrast. COMPARISON:  CT abdomen pelvis Apr 27, 2012. FINDINGS: Lower chest: Tiny right pleural effusion with bibasilar subsegmental atelectasis. Hepatobiliary: Slightly increased size of the hypoattenuating lesion in the left lobe of the liver which now measures 2.3 cm previously 1.7 cm (series 3 image 22). No significant change in size of the hypoattenuating lesion in the caudate lobe which measures 1.1 cm previously 1.0 cm (series 3 image 25). Increased size of a lesion in the posterior aspect of the right lobe of the liver which now measures 1.0 cm previously 0.4 cm (series 3, image 31). The gallbladder is unremarkable. No biliary ductal dilatation. Pancreas: There are few pancreatic calcifications otherwise the pancreas is grossly unremarkable. Spleen: Normal in size without  focal abnormality. Adrenals/Urinary Tract: Adrenal glands are unremarkable. Hypoattenuating exophytic 1.1 cm left interpolar renal lesion (series 3 image 27) and a hypoattenuating exophytic 1.5 cm left upper pole renal lesion (series 3, image 23). No left renal stones or hydronephrosis. The right kidney is normal, without renal calculi, focal lesion, or hydronephrosis. Bladder is unremarkable. Stomach/Bowel: Stomach is predominantly decompressed limiting evaluation. Though difficult to evaluate completely there appears to be low-density wall thickening with adjacent free fluid and gas along the proximal duodenum. The remainder of the small bowel appears unremarkable. Colonic diverticulosis. Without definite findings of diverticulitis. Small volume of formed stool in the colon. Anastomotic suture in the left hemipelvis. Vascular/Lymphatic: Extensive aortic and branch vessel atherosclerosis. There is infrarenal abdominal aortic aneurysm measuring up to 4.3 cm in maximum axial diameter with a previous maximum diameter of 2.9 cm in 2013. No visualized pathologically enlarged lymph nodes. Reproductive: Status post hysterectomy. No adnexal masses. Other: Pneumoperitoneum with small volume ascites predominant long the right pericolic gutter. Musculoskeletal: Multilevel degenerative changes spine. No acute osseous abnormality. IMPRESSION: 1. Pneumoperitoneum with small volume ascites predominant long the right pericolic gutter. Though difficult to evaluate completely there appears to be low-density wall thickening with adjacent free fluid and gas along the proximal duodenum. Findings are concerning for duodenal perforation. 2. Infrarenal abdominal aortic aneurysm measuring up to 4.3 cm in maximum axial diameter with a previous maximum diameter of 2.9 cm in 2013. Recommend follow-up every 12 months and vascular consultation. This recommendation follows ACR consensus guidelines: White Paper of the ACR Incidental Findings  Committee II on Vascular Findings. J Am Coll Radiol 2013; 10:789-794. 3. Slightly increased size of the hypoattenuating lesion in the left lobe of the liver which now measures 2.3 cm previously 1.7 cm. Increased size of a lesion in the posterior aspect of the  right lobe of the liver which now measures 1.0 cm previously 0.4 cm in 2013. Findings are nonspecific, however the rather slow growth over 9 years would indicate a more benign etiology these would more completely evaluated with liver protocol MRI or CT with and without contrast. 4. Hypoattenuating exophytic left renal lesions measuring up to 1.5 cm, which appear relatively homogeneous and favored to represent renal cysts. However these are incompletely evaluated on today's study and new from prior study. Further evaluation with non-emergent renal ultrasound is recommended, alternately these could be further evaluated on CT or MRI if utilized for evaluation hepatic lesions. 5. Tiny right pleural effusion with bibasilar subsegmental atelectasis. 6. Aortic atherosclerosis. Aortic Atherosclerosis (ICD10-I70.0). These results were called by telephone at the time of interpretation on 12/25/2020 at 2:25 pm to provider [facility] , who verbally acknowledged these results. Electronically Signed   By: Dahlia Bailiff MD   On: 12/25/2020 14:47   DG Chest Portable 1 View  Result Date: 12/25/2020 CLINICAL DATA:  Abdominal pain since this morning EXAM: PORTABLE CHEST 1 VIEW COMPARISON:  Portable exam 1255 hours compared to 03/29/2015 FINDINGS: Normal heart size, mediastinal contours, and pulmonary vascularity. Atherosclerotic calcification of a tortuous thoracic aorta. Mild chronic accentuation of markings in the RIGHT upper lobe likely reflecting scarring. No acute infiltrate, pleural effusion, or pneumothorax. Bones demineralized. IMPRESSION: Mild RIGHT upper lobe scarring. No acute abnormalities. Aortic Atherosclerosis (ICD10-I70.0). Electronically Signed   By:  Lavonia Dana M.D.   On: 12/25/2020 13:05   DG Abd Portable 2 Views  Result Date: 12/25/2020 CLINICAL DATA:  Abdominal pain, history of abdominal aortic aneurysm EXAM: PORTABLE ABDOMEN - 2 VIEW COMPARISON:  06/24/2012 Aortic ultrasound 01/30/2018 FINDINGS: Lung bases clear. Nonobstructive bowel gas pattern. No bowel dilatation, bowel wall thickening, or free air. Bones demineralized with multilevel degenerative disc/facet disease changes of thoracolumbar spine and levoconvex scoliosis. Curvilinear LEFT paraspinal calcifications consistent with history of known abdominal aortic aneurysm, though size of aneurysm cannot be assessed by this exam. IMPRESSION: Normal bowel gas pattern. Curvilinear atherosclerotic calcifications at known abdominal aortic aneurysm, though size of aneurysm cannot be assessed radiographically. Aneurysm measured 3.3 cm diameter on an ultrasound exam from 2019 If there is clinical concern for complication of aortic aneurysm or acute enlargement, consider CT. Electronically Signed   By: Lavonia Dana M.D.   On: 12/25/2020 13:11   Anti-infectives (From admission, onward)   Start     Dose/Rate Route Frequency Ordered Stop   12/25/20 2200  piperacillin-tazobactam (ZOSYN) IVPB 3.375 g        3.375 g 12.5 mL/hr over 240 Minutes Intravenous Every 8 hours 12/25/20 1517     12/25/20 1530  vancomycin (VANCOREADY) IVPB 1250 mg/250 mL        1,250 mg 166.7 mL/hr over 90 Minutes Intravenous  Once 12/25/20 1517     12/25/20 1530  fluconazole (DIFLUCAN) IVPB 200 mg        200 mg 100 mL/hr over 60 Minutes Intravenous Every 24 hours 12/25/20 1517     12/25/20 1516  vancomycin variable dose per unstable renal function (pharmacist dosing)         Does not apply See admin instructions 12/25/20 1517     12/25/20 1430  piperacillin-tazobactam (ZOSYN) IVPB 3.375 g        3.375 g 100 mL/hr over 30 Minutes Intravenous  Once 12/25/20 1427         Assessment/Plan Hx of colon cancer - s/p resection  and colostomy takedown  HTN HLD Chronic pain  AKI - Cr 2.14 from baseline of 0.69 Peripheral vascular disease AAA - infrarenal 4.3 cm - f/u per radiology guidelines Osteoporosis  Pneumoperitoneum with possible duodenal perforation  - CT today with wall thickening and adjacent free fluid and gas along proximal duodenum - Start abx and IV fluid resustitation - Will plan for OR, Ex-Lap, today with Dr. Zenia Resides. Dr. Redmond Pulling has discussed risks of general anesthesia, surgery, and the typical post-operative course. He discussed the possibility of placement of a gastrostomy tube at the time of surgery. We discussed she may need a SNF.   - Agree with medicine admission  Jillyn Ledger, [facility] 12/25/2020, 3:01 PM Please see Amion for pager number during day hours 7:00am-4:30pm

## 2020-12-25 NOTE — Progress Notes (Signed)
Pharmacy Antibiotic Note  [name] is a 87 y.o. female admitted on 12/25/2020 with perforated duodenal ulcer.  Pharmacy has been consulted for zosyn, vanc and fluconazole dosing.  Plan: Vancomycin 1250 mg IV x 1, then variable dosing due to unstable renal function Zosyn 3.375g IV every 8 hours (extended infusion) Fluconazole 200 mg IV every 24 hours Monitor renal function, Cx and clinical progression to narrow Surgical plans Vancomycin level as needed     Temp (24hrs), Avg:98.3 F (36.8 C), Min:98.3 F (36.8 C), Max:98.3 F (36.8 C)  Recent Labs  Lab 12/25/20 1240 12/25/20 1250 12/25/20 1255  WBC  --  16.2*  --   CREATININE  --  2.14* 2.00*  LATICACIDVEN 1.7  --   --     CrCl cannot be calculated (Unknown ideal weight.).    No Known Allergies  Antimicrobials this admission: Zosyn 1/17>> Vanc 1/17>> Fluc 1/17>>  Dose adjustments this admission: n/a  Microbiology results:   Bertis Ruddy, PharmD Clinical Pharmacist ED Pharmacist Phone # [phone] 12/25/2020 3:18 PM

## 2020-12-25 NOTE — Op Note (Signed)
Date: 12/25/20  Patient: [name] MRN: [id number]  Preoperative Diagnosis: Perforated duodenal ulcer Postoperative Diagnosis: Same  Procedure: Exploratory laparotomy, Phillip Heal patch repair of perforated duodenal ulcer, placement of feeding jejunostomy tube  Surgeon: Michaelle Birks, MD  EBL: Minimal  Anesthesia: General  Specimens: None  Indications: Ms. [name] is an 87 yo female who presented to the ED with acute abdominal pain. She had been taking NSAIDs for the past few months for mild abdominal pain, but this morning developed acutely worsening RUQ pain. CT scan showed pneumoperitoneum with intraabdominal free fluid and thickening of the proximal duodenum. After an extensive discussion of the risks and benefits of surgery, she was brought urgently to the operating room.  Findings: Small perforated ulcer on the antimesenteric border of the duodenal bulb, repaired with a Phillip Heal patch. 19-Fr JP left adjacent to the repair. Feeding jejunostomy tube placed.  Procedure details: Informed consent was obtained in the preoperative area prior to the procedure. The patient was brought to the operating room and placed on the table in the supine position. General anesthesia was induced and appropriate lines and drains were placed for intraoperative monitoring. Perioperative antibiotics were administered per SCIP guidelines. The abdomen was prepped and draped in the usual sterile fashion. A pre-procedure timeout was taken verifying patient identity, surgical site and procedure to be performed.  An upper midline skin incision was performed and the subcutaneous tissue was divided with cautery. The fascia was incised at the linea alba and opened to enter to the peritoneal cavity. Turbid fluid was present in the RUQ with inflammatory adhesions around the liver. The stomach was grossly normal in appearance. There were inflammatory changes around the proximal duodenum just distal to the pylorus. The  gallbladder was adherent with a surrounding fibrinous exudate. The gallbladder was gently taken off the duodenum using blunt dissection and cautery. A small perforation approximately 9mm in diameter was visualized on the duodenal bulb on the superior aspect, just distal to the pylorus. A partial Kocher maneuver was performed to mobilize the proximal duodenum and fully expose the perforated ulcer. The tissue surrounding the perforation was thickened, consistent with an ulcer, but well-perfused. A tongue of omentum was mobilized and laid over the perforation. The defect was closed with 3-0 silk Lembert sutures, which were then tied down over the omentum to secure it over the perforation and create a Phillip Heal patch. The abdomen was then irrigated with several liters of warmed saline. Next a site for a feeding J tube was identified about 30cm distal to the ligament of treitz. A 3-0 silk pursestring suture was placed on the antimesenteric border, and an enterotomy was created at the center of the pursestring. An 18-Fr feeding jejunostomy tube was brought onto the field and inserted through the left upper quadrant abdominal wall. The end of the tube was fed through the enterotomy and milked distally into the jejunum, until the balloon was inside the bowel. The pursestring was tied down to secure the tube in place. A Witzel tunnel was created with 3-0 silk sutures. The balloon was filled with 2cc sterile water.The bowel was palpated and the lumen around the tube remained patent. The jejunum was then tacked to the abdominal wall around the feeding tube using 3-0 silk sutures.  A 19-Fr fluted JP drain was placed adjacent to the [employer] and brought out through the right abdominal wall, and secured to the skin with 2-0 Nylon suture. The J tube was secured to the skin with 2-0 Nylon suture.  The fascia was closed with a running looped 1 PDS. The skin was closed with staples and a sterile dressing was applied.  The  patient tolerated the procedure with no apparent complications. All counts were correct x2 at the end of the procedure. The patient was extubated and taken to PACU in stable condition.  Michaelle Birks, MD 12/25/20 7:15 PM

## 2020-12-25 NOTE — Plan of Care (Signed)
  Problem: Clinical Measurements: Goal: Ability to maintain clinical measurements within normal limits will improve Outcome: Progressing   

## 2020-12-25 NOTE — ED Triage Notes (Signed)
Pt BIB EMS from home due to abd pain. Pt reports it started this morning.Pt has h/o AAA

## 2020-12-25 NOTE — ED Provider Notes (Signed)
[facility] EMERGENCY DEPARTMENT Provider Note   CSN: [id number] Arrival date & time: 12/25/20  1236     History Chief Complaint  Patient presents with  . Abdominal Pain    [name] is a 87 y.o. female.  HPI Patient presents with acute abdominal pain.  Began this morning.  Severe.  No relief with the 100 mcg of fentanyl by EMS.  No fevers.  No nausea or vomiting.  Has history of AAA but also history of previous colon cancer.  Has not been vomiting.  Not on anticoagulation.  Patient is severe and    Past Medical History:  Diagnosis Date  . Arthritis   . Bowel perforation ([place]) 2012  . Cancer ([place])    colon  . Carotid artery occlusion   . Hyperlipidemia   . Hypertension     Patient Active Problem List   Diagnosis Date Noted  . Aftercare following surgery of the circulatory system, [place] 08/09/2014  . Occlusion and stenosis of carotid artery without mention of cerebral infarction 07/27/2013  . History of sigmoid colon cancer, T4, N0. Resected 04/03/2011. 10/30/2011    Past Surgical History:  Procedure Laterality Date  . ABDOMINAL HYSTERECTOMY    . CAROTID ENDARTERECTOMY Right 04-05-08   CE  . COLON SURGERY     colon resection with colostomy 03/2011  . COLONOSCOPY N/A 07/09/2013   Procedure: COLONOSCOPY;  Surgeon: Jeryl Columbia, MD;  Location: [facility] ENDOSCOPY;  Service: Endoscopy;  Laterality: N/A;  . COLOSTOMY    . COLOSTOMY TAKEDOWN  07/30/2012   Procedure: LAPAROSCOPIC COLOSTOMY TAKEDOWN;  Surgeon: Shann Medal, MD;  Location: [facility] ORS;  Service: General;  Laterality: N/A;  laparoscopic colostomy reversal plus bilateral ureteral stents  . HOT HEMOSTASIS N/A 07/09/2013   Procedure: HOT HEMOSTASIS (ARGON PLASMA COAGULATION/BICAP);  Surgeon: Jeryl Columbia, MD;  Location: [facility];  Service: Endoscopy;  Laterality: N/A;     OB History   No obstetric history on file.     Family History  Problem Relation Age of Onset  . Heart disease Mother         heart attack  . Cancer Sister        breast    Social History   Tobacco Use  . Smoking status: Former Smoker    Quit date: 12/24/1978    Years since quitting: 42.0  . Smokeless tobacco: Never Used  Substance Use Topics  . Alcohol use: Yes    Comment: glass of wine at christmas  . Drug use: No    Home Medications Prior to Admission medications   Medication Sig Start Date End Date Taking? Authorizing Provider  CALCIUM-VITAMIN D PO Take 1 tablet by mouth daily.    [provider]  Cyanocobalamin (VITAMIN B-12 CR PO) Take 1 tablet by mouth daily.    [provider]  doxycycline (VIBRAMYCIN) 100 MG capsule Take 1 capsule (100 mg total) by mouth 2 (two) times daily. 05/26/20   Quintella Reichert, MD  fluorometholone (FML) 0.1 % ophthalmic suspension  12/14/15   [provider]  HYDROcodone-acetaminophen (NORCO/VICODIN) 5-325 MG per tablet Take 1 tablet by mouth every 6 (six) hours as needed. 10/29/13   [provider]  ibuprofen (ADVIL,MOTRIN) 200 MG tablet Take 200 mg by mouth every 6 (six) hours as needed for pain.    [provider]  lisinopril (PRINIVIL,ZESTRIL) 10 MG tablet Take 10 mg by mouth daily after breakfast.    [provider]  ondansetron (  ZOFRAN ODT) 4 MG disintegrating tablet Take 1 tablet (4 mg total) by mouth every 8 (eight) hours as needed for nausea or vomiting. 05/26/20   Quintella Reichert, MD  Polyethyl Glycol-Propyl Glycol (SYSTANE ULTRA) 0.4-0.3 % SOLN Place 1 drop into both eyes 2 (two) times daily as needed (dry eyes).     [provider]  rosuvastatin (CRESTOR) 10 MG tablet Take 10 mg by mouth every evening.    [provider]  tiZANidine (ZANAFLEX) 4 MG tablet Take 4 mg by mouth every 6 (six) hours as needed (pain).    [provider]    Allergies    Patient has no known allergies.  Review of Systems   Review of Systems  Constitutional: Positive for appetite change. Negative for  fever.  HENT: Negative for congestion.   Respiratory: Negative for shortness of breath.   Gastrointestinal: Positive for abdominal pain. Negative for blood in stool, constipation and nausea.  Genitourinary: Negative for flank pain.  Musculoskeletal: Negative for back pain.  Skin: Negative for rash.  Neurological: Negative for weakness.  Psychiatric/Behavioral: Negative for confusion.    Physical Exam Updated Vital Signs BP (!) 182/152   Pulse 89   Temp 98.3 F (36.8 C) (Oral)   Resp 16   SpO2 94%   Physical Exam Vitals and nursing note reviewed.  Constitutional:      Appearance: She is well-developed.  HENT:     Head: Normocephalic.  Cardiovascular:     Rate and Rhythm: Normal rate and regular rhythm.  Abdominal:     Tenderness: There is abdominal tenderness.     Hernia: No hernia is present.     Comments: Diffuse tenderness with guarding.  No hernia palpated.  Skin:    General: Skin is warm.     Capillary Refill: Capillary refill takes less than 2 seconds.  Neurological:     Mental Status: She is alert and oriented to person, place, and time.     ED Results / Procedures / Treatments   Labs (all labs ordered are listed, but only abnormal results are displayed) Labs Reviewed  CBC WITH DIFFERENTIAL/PLATELET - Abnormal; Notable for the following components:      Result Value   WBC 16.2 (*)    Neutro Abs 14.3 (*)    All other components within normal limits  COMPREHENSIVE METABOLIC PANEL - Abnormal; Notable for the following components:   CO2 18 (*)    Glucose, Bld 159 (*)    BUN 41 (*)    Creatinine, Ser 2.14 (*)    Calcium 8.2 (*)    Total Protein 6.4 (*)    GFR, Estimated 22 (*)    All other components within normal limits  I-STAT CHEM 8, ED - Abnormal; Notable for the following components:   BUN 42 (*)    Creatinine, Ser 2.00 (*)    Glucose, Bld 155 (*)    Calcium, Ion 1.05 (*)    TCO2 20 (*)    All other components within normal limits  SARS  CORONAVIRUS 2 (TAT 6-24 HRS)  LACTIC ACID, PLASMA  LACTIC ACID, PLASMA    EKG EKG Interpretation  Date/Time:  Monday December 25 2020 12:42:11 EST Ventricular Rate:  89 PR Interval:    QRS Duration: 98 QT Interval:  379 QTC Calculation: 462 R Axis:   42 Text Interpretation: Sinus rhythm Multiform ventricular premature complexes Nonspecific repol abnormality, diffuse leads Confirmed by Davonna Belling [phone]) on 12/25/2020 12:55:26 PM   Radiology CT ABDOMEN  PELVIS WO CONTRAST  Result Date: 12/25/2020 CLINICAL DATA:  Acute abdominal pain nonlocalized EXAM: CT ABDOMEN AND PELVIS WITHOUT CONTRAST TECHNIQUE: Multidetector CT imaging of the abdomen and pelvis was performed following the standard protocol without IV contrast. COMPARISON:  CT abdomen pelvis Apr 27, 2012. FINDINGS: Lower chest: Tiny right pleural effusion with bibasilar subsegmental atelectasis. Hepatobiliary: Slightly increased size of the hypoattenuating lesion in the left lobe of the liver which now measures 2.3 cm previously 1.7 cm (series 3 image 22). No significant change in size of the hypoattenuating lesion in the caudate lobe which measures 1.1 cm previously 1.0 cm (series 3 image 25). Increased size of a lesion in the posterior aspect of the right lobe of the liver which now measures 1.0 cm previously 0.4 cm (series 3, image 31). The gallbladder is unremarkable. No biliary ductal dilatation. Pancreas: There are few pancreatic calcifications otherwise the pancreas is grossly unremarkable. Spleen: Normal in size without focal abnormality. Adrenals/Urinary Tract: Adrenal glands are unremarkable. Hypoattenuating exophytic 1.1 cm left interpolar renal lesion (series 3 image 27) and a hypoattenuating exophytic 1.5 cm left upper pole renal lesion (series 3, image 23). No left renal stones or hydronephrosis. The right kidney is normal, without renal calculi, focal lesion, or hydronephrosis. Bladder is unremarkable. Stomach/Bowel:  Stomach is predominantly decompressed limiting evaluation. Though difficult to evaluate completely there appears to be low-density wall thickening with adjacent free fluid and gas along the proximal duodenum. The remainder of the small bowel appears unremarkable. Colonic diverticulosis. Without definite findings of diverticulitis. Small volume of formed stool in the colon. Anastomotic suture in the left hemipelvis. Vascular/Lymphatic: Extensive aortic and branch vessel atherosclerosis. There is infrarenal abdominal aortic aneurysm measuring up to 4.3 cm in maximum axial diameter with a previous maximum diameter of 2.9 cm in 2013. No visualized pathologically enlarged lymph nodes. Reproductive: Status post hysterectomy. No adnexal masses. Other: Pneumoperitoneum with small volume ascites predominant long the right pericolic gutter. Musculoskeletal: Multilevel degenerative changes spine. No acute osseous abnormality. IMPRESSION: 1. Pneumoperitoneum with small volume ascites predominant long the right pericolic gutter. Though difficult to evaluate completely there appears to be low-density wall thickening with adjacent free fluid and gas along the proximal duodenum. Findings are concerning for duodenal perforation. 2. Infrarenal abdominal aortic aneurysm measuring up to 4.3 cm in maximum axial diameter with a previous maximum diameter of 2.9 cm in 2013. Recommend follow-up every 12 months and vascular consultation. This recommendation follows ACR consensus guidelines: White Paper of the ACR Incidental Findings Committee II on Vascular Findings. J Am Coll Radiol 2013; 10:789-794. 3. Slightly increased size of the hypoattenuating lesion in the left lobe of the liver which now measures 2.3 cm previously 1.7 cm. Increased size of a lesion in the posterior aspect of the right lobe of the liver which now measures 1.0 cm previously 0.4 cm in 2013. Findings are nonspecific, however the rather slow growth over 9 years would  indicate a more benign etiology these would more completely evaluated with liver protocol MRI or CT with and without contrast. 4. Hypoattenuating exophytic left renal lesions measuring up to 1.5 cm, which appear relatively homogeneous and favored to represent renal cysts. However these are incompletely evaluated on today's study and new from prior study. Further evaluation with non-emergent renal ultrasound is recommended, alternately these could be further evaluated on CT or MRI if utilized for evaluation hepatic lesions. 5. Tiny right pleural effusion with bibasilar subsegmental atelectasis. 6. Aortic atherosclerosis. Aortic Atherosclerosis (ICD10-I70.0). These results were called by  telephone at the time of interpretation on 12/25/2020 at 2:25 pm to provider [facility] , who verbally acknowledged these results. Electronically Signed   By: Dahlia Bailiff MD   On: 12/25/2020 14:47   DG Chest Portable 1 View  Result Date: 12/25/2020 CLINICAL DATA:  Abdominal pain since this morning EXAM: PORTABLE CHEST 1 VIEW COMPARISON:  Portable exam 1255 hours compared to 03/29/2015 FINDINGS: Normal heart size, mediastinal contours, and pulmonary vascularity. Atherosclerotic calcification of a tortuous thoracic aorta. Mild chronic accentuation of markings in the RIGHT upper lobe likely reflecting scarring. No acute infiltrate, pleural effusion, or pneumothorax. Bones demineralized. IMPRESSION: Mild RIGHT upper lobe scarring. No acute abnormalities. Aortic Atherosclerosis (ICD10-I70.0). Electronically Signed   By: Lavonia Dana M.D.   On: 12/25/2020 13:05   DG Abd Portable 2 Views  Result Date: 12/25/2020 CLINICAL DATA:  Abdominal pain, history of abdominal aortic aneurysm EXAM: PORTABLE ABDOMEN - 2 VIEW COMPARISON:  06/24/2012 Aortic ultrasound 01/30/2018 FINDINGS: Lung bases clear. Nonobstructive bowel gas pattern. No bowel dilatation, bowel wall thickening, or free air. Bones demineralized with multilevel  degenerative disc/facet disease changes of thoracolumbar spine and levoconvex scoliosis. Curvilinear LEFT paraspinal calcifications consistent with history of known abdominal aortic aneurysm, though size of aneurysm cannot be assessed by this exam. IMPRESSION: Normal bowel gas pattern. Curvilinear atherosclerotic calcifications at known abdominal aortic aneurysm, though size of aneurysm cannot be assessed radiographically. Aneurysm measured 3.3 cm diameter on an ultrasound exam from 2019 If there is clinical concern for complication of aortic aneurysm or acute enlargement, consider CT. Electronically Signed   By: Lavonia Dana M.D.   On: 12/25/2020 13:11    Procedures Procedures (including critical care time)  Medications Ordered in ED Medications  piperacillin-tazobactam (ZOSYN) IVPB 3.375 g (has no administration in time range)  pantoprazole (PROTONIX) 80 mg in sodium chloride 0.9 % 100 mL (0.8 mg/mL) infusion (has no administration in time range)  labetalol (NORMODYNE) injection 10 mg (has no administration in time range)  sodium chloride 0.9 % bolus 1,000 mL (0 mLs Intravenous Stopped 12/25/20 1355)  ondansetron (ZOFRAN) injection 4 mg (4 mg Intravenous Given 12/25/20 1257)  HYDROmorphone (DILAUDID) injection 0.5 mg (0.5 mg Intravenous Given 12/25/20 1257)    ED Course  I have reviewed the triage vital signs and the nursing notes.  Pertinent labs & imaging results that were available during my care of the patient were reviewed by me and considered in my medical decision making (see chart for details).    MDM Rules/Calculators/A&P                          Patient with abdominal pain.  Acute onset.  Diffuse.  There was initial worry for perforated viscus with surgical abdomen.  Acute abdominal series however did not show free air.  White count was mildly elevated.  Creatinine also elevated 2.  CT scan done as quick as possible with no IV or oral contrast.  CT scan does show free air likely from  perforated duodenal ulcer.  After further discussion with patient's daughter patient has been taking a lot of Mobic recently.  Does not appear to be in sepsis.  Zosyn started once free air seen on CT scan.  Talk to general surgery who will likely take patient OR.  However due to comorbidities and age the surgeons request admission to internal medicine.  Discussed with Dr. Roosevelt Locks, who will admit the patient.  CRITICAL CARE Performed by: Davonna Belling Total  critical care time: 30 minutes Critical care time was exclusive of separately billable procedures and treating other patients. Critical care was necessary to treat or prevent imminent or life-threatening deterioration. Critical care was time spent personally by me on the following activities: development of treatment plan with patient and/or surrogate as well as nursing, discussions with consultants, evaluation of patient's response to treatment, examination of patient, obtaining history from patient or surrogate, ordering and performing treatments and interventions, ordering and review of laboratory studies, ordering and review of radiographic studies, pulse oximetry and re-evaluation of patient's condition.  Final Clinical Impression(s) / ED Diagnoses Final diagnoses:  Perforated duodenal ulcer ([place])  AKI (acute kidney injury) [facility])    Rx / [place] Orders ED Discharge Orders    None       Davonna Belling, MD 12/25/20 [phone]

## 2020-12-25 NOTE — Anesthesia Preprocedure Evaluation (Addendum)
Anesthesia Evaluation  Patient identified by MRN, date of birth, ID band Patient awake and Patient confused    Reviewed: Allergy & Precautions, NPO status , Patient's Chart, lab work & pertinent test results  Airway Mallampati: II  TM Distance: >3 FB Neck ROM: Full    Dental  (+) Dental Advisory Given, Edentulous Upper   Pulmonary former smoker,    Pulmonary exam normal breath sounds clear to auscultation       Cardiovascular hypertension, Pt. on medications + Peripheral Vascular Disease  Normal cardiovascular exam Rhythm:Regular Rate:Normal     Neuro/Psych negative neurological ROS     GI/Hepatic Neg liver ROS, Perforated duodenum H/o colon cancer    Endo/Other  negative endocrine ROS  Renal/GU Renal disease (AKI)     Musculoskeletal  (+) Arthritis ,   Abdominal   Peds  Hematology negative hematology ROS (+)   Anesthesia Other Findings Day of surgery medications reviewed with the patient.  Reproductive/Obstetrics                            Anesthesia Physical Anesthesia Plan  ASA: III  Anesthesia Plan: General   Post-op Pain Management:    Induction: Intravenous  PONV Risk Score and Plan: 3 and Dexamethasone and Ondansetron  Airway Management Planned: Oral ETT  Additional Equipment:   Intra-op Plan:   Post-operative Plan: Possible Post-op intubation/ventilation  Informed Consent: I have reviewed the patients History and Physical, chart, labs and discussed the procedure including the risks, benefits and alternatives for the proposed anesthesia with the patient or authorized representative who has indicated his/her understanding and acceptance.     Dental advisory given and Consent reviewed with POA  Plan Discussed with: CRNA  Anesthesia Plan Comments: (Possible arterial line, possible CVL.  Consented with patient's daughter.)       Anesthesia Quick  Evaluation

## 2020-12-25 NOTE — Progress Notes (Signed)
Pt pulled NGT out.  Pt Oriented x3, does not remember pulling it out.  Dr. Nicholes Mango and aware, ok to leave NGT out unless pt nausea/vomiting. Will continue to monitor

## 2020-12-25 NOTE — Transfer of Care (Signed)
Immediate Anesthesia Transfer of Care Note  Patient: [name]  Procedure(s) Performed: EXPLORATORY LAPAROTOMY (N/A Abdomen) JEJUNOSTOMY (Abdomen) REPAIR OF PERFORATED DUODENUM WITH ULCER (Abdomen)  Patient Location: PACU  Anesthesia Type:General  Level of Consciousness: drowsy  Airway & Oxygen Therapy: Patient Spontanous Breathing and Patient connected to face mask oxygen  Post-op Assessment: Report given to RN and Post -op Vital signs reviewed and stable  Post vital signs: Reviewed and stable  Last Vitals:  Vitals Value Taken Time  BP    Temp    Pulse    Resp    SpO2      Last Pain:  Vitals:   12/25/20 1615  TempSrc:   PainSc: Asleep         Complications: No complications documented.

## 2020-12-25 NOTE — Anesthesia Procedure Notes (Signed)
Procedure Name: Intubation Date/Time: 12/25/2020 5:20 PM Performed by: Clovis Cao, CRNA Pre-anesthesia Checklist: Patient identified, Emergency Drugs available, Suction available, Patient being monitored and Timeout performed Patient Re-evaluated:Patient Re-evaluated prior to induction Oxygen Delivery Method: Circle system utilized Preoxygenation: Pre-oxygenation with 100% oxygen Induction Type: IV induction, Rapid sequence and Cricoid Pressure applied Laryngoscope Size: Miller and 2 Grade View: Grade I Tube type: Oral Tube size: 7.0 mm Number of attempts: 1 Airway Equipment and Method: Stylet Placement Confirmation: ETT inserted through vocal cords under direct vision,  positive ETCO2 and breath sounds checked- equal and bilateral Secured at: 21 cm Tube secured with: Tape Dental Injury: Teeth and Oropharynx as per pre-operative assessment

## 2020-12-25 NOTE — H&P (Signed)
History and Physical    [name] [id number] DOB: 04/16/1932 DOA: 12/25/2020  PCP: Jonathon Jordan, MD (Confirm with patient/family/NH records and if not entered, this has to be entered at [facility] point of entry) Patient coming from: Home  I have personally briefly reviewed patient's old medical records in [place]  Chief Complaint: Abd pain  HPI: [name] is a 87 y.o. female with medical history significant of HTN, HLD, colon CA status post partial colectomy, presented with worsening of abdominal pain.  Patient in severe pain, most history provided by patient daughter and bedside.  Daughter reported that patient has been having " stomach aching mostly in the mornings for last 2 to 3 months".  She has to take multiple doses of meloxicam and NSAIDs to quench the pain, pain eased away as the day goes.  She describes the pain as aching/annoying like, associated with nausea but no vomiting.  This morning, patient woke up with similar pain but much more severe 10/10, and family called EMS.  No fever or chills. ED Course: CT abdomen showed pneumoperitoneum suspicious for perforated duodenum.  WBC 16.2.  0.1.  Review of Systems: As per HPI otherwise 14 point review of systems negative.    Past Medical History:  Diagnosis Date  . Arthritis   . Bowel perforation ([place]) 2012  . Cancer ([place])    colon  . Carotid artery occlusion   . Hyperlipidemia   . Hypertension     Past Surgical History:  Procedure Laterality Date  . ABDOMINAL HYSTERECTOMY    . CAROTID ENDARTERECTOMY Right 04-05-08   CE  . COLON SURGERY     colon resection with colostomy 03/2011  . COLONOSCOPY N/A 07/09/2013   Procedure: COLONOSCOPY;  Surgeon: Jeryl Columbia, MD;  Location: [facility] ENDOSCOPY;  Service: Endoscopy;  Laterality: N/A;  . COLOSTOMY    . COLOSTOMY TAKEDOWN  07/30/2012   Procedure: LAPAROSCOPIC COLOSTOMY TAKEDOWN;  Surgeon: Shann Medal, MD;  Location: [facility] ORS;  Service: General;   Laterality: N/A;  laparoscopic colostomy reversal plus bilateral ureteral stents  . HOT HEMOSTASIS N/A 07/09/2013   Procedure: HOT HEMOSTASIS (ARGON PLASMA COAGULATION/BICAP);  Surgeon: Jeryl Columbia, MD;  Location: [facility];  Service: Endoscopy;  Laterality: N/A;     reports that she quit smoking about 42 years ago. She has never used smokeless tobacco. She reports current alcohol use. She reports that she does not use drugs.  No Known Allergies  Family History  Problem Relation Age of Onset  . Heart disease Mother        heart attack  . Cancer Sister        breast     Prior to Admission medications   Medication Sig Start Date End Date Taking? Authorizing Provider  CALCIUM-VITAMIN D PO Take 1 tablet by mouth daily.    [provider]  Cyanocobalamin (VITAMIN B-12 CR PO) Take 1 tablet by mouth daily.    [provider]  doxycycline (VIBRAMYCIN) 100 MG capsule Take 1 capsule (100 mg total) by mouth 2 (two) times daily. 05/26/20   Quintella Reichert, MD  fluorometholone (FML) 0.1 % ophthalmic suspension  12/14/15   [provider]  HYDROcodone-acetaminophen (NORCO/VICODIN) 5-325 MG per tablet Take 1 tablet by mouth every 6 (six) hours as needed. 10/29/13   [provider]  ibuprofen (ADVIL,MOTRIN) 200 MG tablet Take 200 mg by mouth every 6 (six) hours as needed for pain.    [provider]  lisinopril (  PRINIVIL,ZESTRIL) 10 MG tablet Take 10 mg by mouth daily after breakfast.    [provider]  ondansetron (ZOFRAN ODT) 4 MG disintegrating tablet Take 1 tablet (4 mg total) by mouth every 8 (eight) hours as needed for nausea or vomiting. 05/26/20   Quintella Reichert, MD  Polyethyl Glycol-Propyl Glycol (SYSTANE ULTRA) 0.4-0.3 % SOLN Place 1 drop into both eyes 2 (two) times daily as needed (dry eyes).     [provider]  rosuvastatin (CRESTOR) 10 MG tablet Take 10 mg by mouth every evening.    [provider]  tiZANidine  (ZANAFLEX) 4 MG tablet Take 4 mg by mouth every 6 (six) hours as needed (pain).    [provider]    Physical Exam: Vitals:   12/25/20 1430 12/25/20 1445 12/25/20 1545 12/25/20 1600  BP: (!) 190/75 (!) 182/152 (!) 176/80 (!) 174/79  Pulse: 90 89 91 85  Resp: 16 16 16 17   Temp:      TempSrc:      SpO2: 95% 94% 95% 95%    Constitutional: NAD, calm, comfortable Vitals:   12/25/20 1430 12/25/20 1445 12/25/20 1545 12/25/20 1600  BP: (!) 190/75 (!) 182/152 (!) 176/80 (!) 174/79  Pulse: 90 89 91 85  Resp: 16 16 16 17   Temp:      TempSrc:      SpO2: 95% 94% 95% 95%   Eyes: PERRL, lids and conjunctivae normal ENMT: Mucous membranes are dry. Posterior pharynx clear of any exudate or lesions.Normal dentition.  Neck: normal, supple, no masses, no thyromegaly Respiratory: clear to auscultation bilaterally, no wheezing, no crackles. Normal respiratory effort. No accessory muscle use.  Cardiovascular: Regular rate and rhythm, no murmurs / rubs / gallops. No extremity edema. 2+ pedal pulses. No carotid bruits.  Abdomen: Severe tenderness all quadrants, positive for rebound and guarding Musculoskeletal: no clubbing / cyanosis. No joint deformity upper and lower extremities. Good ROM, no contractures. Normal muscle tone.  Skin: no rashes, lesions, ulcers. No induration Neurologic: CN 2-12 grossly intact. Sensation intact, DTR normal. Strength 5/5 in all 4.  Psychiatric: Normal judgment and insight. Alert and oriented x 3. Normal mood.    Labs on Admission: I have personally reviewed following labs and imaging studies  CBC: Recent Labs  Lab 12/25/20 1250 12/25/20 1255  WBC 16.2*  --   NEUTROABS 14.3*  --   HGB 12.7 13.6  HCT 39.1 40.0  MCV 96.3  --   PLT 220  --    Basic Metabolic Panel: Recent Labs  Lab 12/25/20 1250 12/25/20 1255  NA 137 139  K 4.5 4.6  CL 104 107  CO2 18*  --   GLUCOSE 159* 155*  BUN 41* 42*  CREATININE 2.14* 2.00*  CALCIUM 8.2*  --     GFR: CrCl cannot be calculated (Unknown ideal weight.). Liver Function Tests: Recent Labs  Lab 12/25/20 1250  AST 16  ALT 10  ALKPHOS 47  BILITOT 0.9  PROT 6.4*  ALBUMIN 3.6   No results for input(s): LIPASE, AMYLASE in the last 168 hours. No results for input(s): AMMONIA in the last 168 hours. Coagulation Profile: No results for input(s): INR, PROTIME in the last 168 hours. Cardiac Enzymes: No results for input(s): CKTOTAL, CKMB, CKMBINDEX, TROPONINI in the last 168 hours. BNP (last 3 results) No results for input(s): PROBNP in the last 8760 hours. HbA1C: No results for input(s): HGBA1C in the last 72 hours. CBG: No results for input(s): GLUCAP in the last 168  hours. Lipid Profile: No results for input(s): CHOL, HDL, LDLCALC, TRIG, CHOLHDL, LDLDIRECT in the last 72 hours. Thyroid Function Tests: No results for input(s): TSH, T4TOTAL, FREET4, T3FREE, THYROIDAB in the last 72 hours. Anemia Panel: No results for input(s): VITAMINB12, FOLATE, FERRITIN, TIBC, IRON, RETICCTPCT in the last 72 hours. Urine analysis:    Component Value Date/Time   COLORURINE YELLOW 12/27/2010 2005   APPEARANCEUR CLOUDY (A) 12/27/2010 2005   LABSPEC 1.010 12/27/2010 2005   PHURINE 6.0 12/27/2010 2005   GLUCOSEU NEGATIVE 12/11/2010 1927   HGBUR TRACE (A) 12/27/2010 2005   BILIRUBINUR NEGATIVE 12/27/2010 2005   KETONESUR NEGATIVE 12/27/2010 2005   PROTEINUR NEGATIVE 12/27/2010 2005   UROBILINOGEN 0.2 12/27/2010 2005   NITRITE NEGATIVE 12/27/2010 2005   LEUKOCYTESUR MODERATE (A) 12/27/2010 2005    Radiological Exams on Admission: CT ABDOMEN PELVIS WO CONTRAST  Result Date: 12/25/2020 CLINICAL DATA:  Acute abdominal pain nonlocalized EXAM: CT ABDOMEN AND PELVIS WITHOUT CONTRAST TECHNIQUE: Multidetector CT imaging of the abdomen and pelvis was performed following the standard protocol without IV contrast. COMPARISON:  CT abdomen pelvis Apr 27, 2012. FINDINGS: Lower chest: Tiny right pleural  effusion with bibasilar subsegmental atelectasis. Hepatobiliary: Slightly increased size of the hypoattenuating lesion in the left lobe of the liver which now measures 2.3 cm previously 1.7 cm (series 3 image 22). No significant change in size of the hypoattenuating lesion in the caudate lobe which measures 1.1 cm previously 1.0 cm (series 3 image 25). Increased size of a lesion in the posterior aspect of the right lobe of the liver which now measures 1.0 cm previously 0.4 cm (series 3, image 31). The gallbladder is unremarkable. No biliary ductal dilatation. Pancreas: There are few pancreatic calcifications otherwise the pancreas is grossly unremarkable. Spleen: Normal in size without focal abnormality. Adrenals/Urinary Tract: Adrenal glands are unremarkable. Hypoattenuating exophytic 1.1 cm left interpolar renal lesion (series 3 image 27) and a hypoattenuating exophytic 1.5 cm left upper pole renal lesion (series 3, image 23). No left renal stones or hydronephrosis. The right kidney is normal, without renal calculi, focal lesion, or hydronephrosis. Bladder is unremarkable. Stomach/Bowel: Stomach is predominantly decompressed limiting evaluation. Though difficult to evaluate completely there appears to be low-density wall thickening with adjacent free fluid and gas along the proximal duodenum. The remainder of the small bowel appears unremarkable. Colonic diverticulosis. Without definite findings of diverticulitis. Small volume of formed stool in the colon. Anastomotic suture in the left hemipelvis. Vascular/Lymphatic: Extensive aortic and branch vessel atherosclerosis. There is infrarenal abdominal aortic aneurysm measuring up to 4.3 cm in maximum axial diameter with a previous maximum diameter of 2.9 cm in 2013. No visualized pathologically enlarged lymph nodes. Reproductive: Status post hysterectomy. No adnexal masses. Other: Pneumoperitoneum with small volume ascites predominant long the right pericolic gutter.  Musculoskeletal: Multilevel degenerative changes spine. No acute osseous abnormality. IMPRESSION: 1. Pneumoperitoneum with small volume ascites predominant long the right pericolic gutter. Though difficult to evaluate completely there appears to be low-density wall thickening with adjacent free fluid and gas along the proximal duodenum. Findings are concerning for duodenal perforation. 2. Infrarenal abdominal aortic aneurysm measuring up to 4.3 cm in maximum axial diameter with a previous maximum diameter of 2.9 cm in 2013. Recommend follow-up every 12 months and vascular consultation. This recommendation follows ACR consensus guidelines: White Paper of the ACR Incidental Findings Committee II on Vascular Findings. J Am Coll Radiol 2013; 10:789-794. 3. Slightly increased size of the hypoattenuating lesion in the left lobe of the liver  which now measures 2.3 cm previously 1.7 cm. Increased size of a lesion in the posterior aspect of the right lobe of the liver which now measures 1.0 cm previously 0.4 cm in 2013. Findings are nonspecific, however the rather slow growth over 9 years would indicate a more benign etiology these would more completely evaluated with liver protocol MRI or CT with and without contrast. 4. Hypoattenuating exophytic left renal lesions measuring up to 1.5 cm, which appear relatively homogeneous and favored to represent renal cysts. However these are incompletely evaluated on today's study and new from prior study. Further evaluation with non-emergent renal ultrasound is recommended, alternately these could be further evaluated on CT or MRI if utilized for evaluation hepatic lesions. 5. Tiny right pleural effusion with bibasilar subsegmental atelectasis. 6. Aortic atherosclerosis. Aortic Atherosclerosis (ICD10-I70.0). These results were called by telephone at the time of interpretation on 12/25/2020 at 2:25 pm to provider [facility] , who verbally acknowledged these results. Electronically  Signed   By: Dahlia Bailiff MD   On: 12/25/2020 14:47   DG Chest Portable 1 View  Result Date: 12/25/2020 CLINICAL DATA:  Abdominal pain since this morning EXAM: PORTABLE CHEST 1 VIEW COMPARISON:  Portable exam 1255 hours compared to 03/29/2015 FINDINGS: Normal heart size, mediastinal contours, and pulmonary vascularity. Atherosclerotic calcification of a tortuous thoracic aorta. Mild chronic accentuation of markings in the RIGHT upper lobe likely reflecting scarring. No acute infiltrate, pleural effusion, or pneumothorax. Bones demineralized. IMPRESSION: Mild RIGHT upper lobe scarring. No acute abnormalities. Aortic Atherosclerosis (ICD10-I70.0). Electronically Signed   By: Lavonia Dana M.D.   On: 12/25/2020 13:05   DG Abd Portable 2 Views  Result Date: 12/25/2020 CLINICAL DATA:  Abdominal pain, history of abdominal aortic aneurysm EXAM: PORTABLE ABDOMEN - 2 VIEW COMPARISON:  06/24/2012 Aortic ultrasound 01/30/2018 FINDINGS: Lung bases clear. Nonobstructive bowel gas pattern. No bowel dilatation, bowel wall thickening, or free air. Bones demineralized with multilevel degenerative disc/facet disease changes of thoracolumbar spine and levoconvex scoliosis. Curvilinear LEFT paraspinal calcifications consistent with history of known abdominal aortic aneurysm, though size of aneurysm cannot be assessed by this exam. IMPRESSION: Normal bowel gas pattern. Curvilinear atherosclerotic calcifications at known abdominal aortic aneurysm, though size of aneurysm cannot be assessed radiographically. Aneurysm measured 3.3 cm diameter on an ultrasound exam from 2019 If there is clinical concern for complication of aortic aneurysm or acute enlargement, consider CT. Electronically Signed   By: Lavonia Dana M.D.   On: 12/25/2020 13:11    EKG: Independently reviewed.  Sinus, frequent PVCs  Assessment/Plan Active Problems:   Peritonitis ([place])  (please populate well all problems here in Problem List. (For example, if  patient is on BP meds at home and you resume or decide to hold them, it is a problem that needs to be her. Same for CAD, COPD, HLD and so on)  Acute peritonitis secondary to perforated duodenal -Suspect underlying peptic ulcer -Antibiotic coverage for upper GI perforation, vancomycin Zosyn and fluconazole, adjust according to culture from surgery -PPI drip until after surgery and probably can go BID -Surgeon discussed with patient regarding J-tube temporarily for feeding.  AKI -Severely dehydrated, received 2 L of IV boluses -Continue maintenance IV fluid, expect for NPO for few days after OR.  Frequent PVCs -K>4 -Check Mg and Phos -Start PRN beta-blocker  Uncontrolled hypertension -Start IV labetalol as needed for now  History of AAA -Somewhat enlargement of size of the AAA, discussed with patient daughter at bedside who plans to take patient to  vascular surgery for follow-up.  DVT prophylaxis: Heparin subcu Code Status: Full code Family Communication: Daughter at bedside Disposition Plan: Expect more than 2 midnight hospital stay for peritonitis treatment. Consults called: General surgery Admission status: Telemetry admission   Lequita Halt MD [facility] Pager [phone]  12/25/2020, 5:07 PM

## 2020-12-26 ENCOUNTER — Other Ambulatory Visit: Payer: Self-pay

## 2020-12-26 ENCOUNTER — Encounter (HOSPITAL_COMMUNITY): Payer: Self-pay | Admitting: Surgery

## 2020-12-26 DIAGNOSIS — K265 Chronic or unspecified duodenal ulcer with perforation: Secondary | ICD-10-CM | POA: Diagnosis not present

## 2020-12-26 DIAGNOSIS — N179 Acute kidney failure, unspecified: Secondary | ICD-10-CM | POA: Diagnosis not present

## 2020-12-26 LAB — CBC
HCT: 32 % — ABNORMAL LOW (ref 36.0–46.0)
Hemoglobin: 10.7 g/dL — ABNORMAL LOW (ref 12.0–15.0)
MCH: 31.2 pg (ref 26.0–34.0)
MCHC: 33.4 g/dL (ref 30.0–36.0)
MCV: 93.3 fL (ref 80.0–100.0)
Platelets: 174 10*3/uL (ref 150–400)
RBC: 3.43 MIL/uL — ABNORMAL LOW (ref 3.87–5.11)
RDW: 12.5 % (ref 11.5–15.5)
WBC: 16.9 10*3/uL — ABNORMAL HIGH (ref 4.0–10.5)
nRBC: 0 % (ref 0.0–0.2)

## 2020-12-26 LAB — COMPREHENSIVE METABOLIC PANEL
ALT: 13 U/L (ref 0–44)
AST: 23 U/L (ref 15–41)
Albumin: 3.3 g/dL — ABNORMAL LOW (ref 3.5–5.0)
Alkaline Phosphatase: 31 U/L — ABNORMAL LOW (ref 38–126)
Anion gap: 11 (ref 5–15)
BUN: 32 mg/dL — ABNORMAL HIGH (ref 8–23)
CO2: 20 mmol/L — ABNORMAL LOW (ref 22–32)
Calcium: 7.3 mg/dL — ABNORMAL LOW (ref 8.9–10.3)
Chloride: 108 mmol/L (ref 98–111)
Creatinine, Ser: 1.82 mg/dL — ABNORMAL HIGH (ref 0.44–1.00)
GFR, Estimated: 27 mL/min — ABNORMAL LOW (ref >60)
Glucose, Bld: 141 mg/dL — ABNORMAL HIGH (ref 70–99)
Potassium: 5.1 mmol/L (ref 3.5–5.1)
Sodium: 139 mmol/L (ref 135–145)
Total Bilirubin: 0.8 mg/dL (ref 0.3–1.2)
Total Protein: 5.6 g/dL — ABNORMAL LOW (ref 6.5–8.1)

## 2020-12-26 LAB — MAGNESIUM: Magnesium: 1.7 mg/dL (ref 1.7–2.4)

## 2020-12-26 MED ORDER — SODIUM CHLORIDE 0.9 % IV SOLN
8.0000 mg/h | INTRAVENOUS | Status: DC
  Administered 2020-12-26 – 2020-12-27 (×2): 8 mg/h via INTRAVENOUS
  Filled 2020-12-26 (×4): qty 80

## 2020-12-26 MED ORDER — METHOCARBAMOL 1000 MG/10ML IJ SOLN
500.0000 mg | Freq: Three times a day (TID) | INTRAVENOUS | Status: DC | PRN
  Filled 2020-12-26: qty 5

## 2020-12-26 MED ORDER — VANCOMYCIN HCL 500 MG/100ML IV SOLN
500.0000 mg | INTRAVENOUS | Status: DC
  Administered 2020-12-26: 500 mg via INTRAVENOUS
  Filled 2020-12-26 (×2): qty 100

## 2020-12-26 MED ORDER — MAGNESIUM SULFATE 2 GM/50ML IV SOLN
2.0000 g | Freq: Once | INTRAVENOUS | Status: AC
  Administered 2020-12-26: 2 g via INTRAVENOUS
  Filled 2020-12-26: qty 50

## 2020-12-26 MED ORDER — CHLORHEXIDINE GLUCONATE CLOTH 2 % EX PADS
6.0000 | MEDICATED_PAD | Freq: Every day | CUTANEOUS | Status: DC
  Administered 2020-12-26 – 2020-12-28 (×2): 6 via TOPICAL

## 2020-12-26 MED ORDER — FREE WATER
30.0000 mL | Freq: Three times a day (TID) | Status: DC
  Administered 2020-12-26 – 2020-12-29 (×8): 30 mL

## 2020-12-26 MED ORDER — ACETAMINOPHEN 10 MG/ML IV SOLN
1000.0000 mg | Freq: Four times a day (QID) | INTRAVENOUS | Status: AC
  Administered 2020-12-26 – 2020-12-27 (×3): 1000 mg via INTRAVENOUS
  Filled 2020-12-26 (×4): qty 100

## 2020-12-26 MED ORDER — PIPERACILLIN-TAZOBACTAM IN DEX 2-0.25 GM/50ML IV SOLN
2.2500 g | Freq: Three times a day (TID) | INTRAVENOUS | Status: DC
  Administered 2020-12-26 – 2020-12-28 (×5): 2.25 g via INTRAVENOUS
  Filled 2020-12-26 (×6): qty 50

## 2020-12-26 NOTE — Progress Notes (Signed)
Pharmacy Antibiotic Note  [name] is a 87 y.o. female admitted on 12/25/2020 with perforated duodenal ulcer s/p ex lap 1/17.  Pharmacy has been consulted for zosyn, vanc and fluconazole dosing. -SCr= 1.82 (BL 0.7 in 2013), UOP ~ 1800, CrCl ~ 20  Plan: Vancomycin 500mg  IV q24h Decrease Zosyn to 2.25g q 8 hrs. Fluconazole 200 mg IV every 24 hours Monitor renal function, Cx and clinical progression to narrow Vancomycin level as needed  Height: 5\' 3"  (160 cm) Weight: 56 kg (123 lb 7.3 oz) IBW/kg (Calculated) : 52.4  Temp (24hrs), Avg:97.9 F (36.6 C), Min:97.2 F (36.2 C), Max:98.8 F (37.1 C)  Recent Labs  Lab 12/25/20 1240 12/25/20 1250 12/25/20 1255 12/25/20 2039 12/26/20 0040  WBC  --  16.2*  --   --  16.9*  CREATININE  --  2.14* 2.00*  --  1.82*  LATICACIDVEN 1.7  --   --  1.3  --     Estimated Creatinine Clearance: 18.4 mL/min (A) (by C-G formula based on SCr of 1.82 mg/dL (H)).    No Known Allergies  Antimicrobials this admission: Zosyn 1/17>> Vanc 1/17>> Fluc 1/17>>  Dose adjustments this admission: n/a  Microbiology results: n/a  Hildred Laser, PharmD Clinical Pharmacist **Pharmacist phone directory can now be found on [email or web address] ([email or web address]).  Listed under [place].

## 2020-12-26 NOTE — Anesthesia Postprocedure Evaluation (Signed)
Anesthesia Post Note  Patient: [name]  Procedure(s) Performed: EXPLORATORY LAPAROTOMY (N/A Abdomen) JEJUNOSTOMY (Abdomen) REPAIR OF PERFORATED DUODENUM WITH ULCER (Abdomen)     Patient location during evaluation: PACU Anesthesia Type: General Level of consciousness: awake Pain management: pain level controlled Vital Signs Assessment: post-procedure vital signs reviewed and stable Respiratory status: spontaneous breathing, nonlabored ventilation, respiratory function stable and patient connected to nasal cannula oxygen Cardiovascular status: blood pressure returned to baseline and stable Postop Assessment: no apparent nausea or vomiting Anesthetic complications: no   No complications documented.  Last Vitals:  Vitals:   12/25/20 2322 12/26/20 0358  BP: (!) 151/69 (!) 128/57  Pulse: 81 77  Resp: 17 20  Temp: 36.6 C 36.9 C  SpO2: 95% 94%    Last Pain:  Vitals:   12/26/20 0358  TempSrc: Oral  PainSc: 0-No pain                 Ryan P Ellender

## 2020-12-26 NOTE — Progress Notes (Signed)
Pt noted to have 6 beat run of VT then a 5 beat run.  VSS.  Asymptomatic.  Text page to Dr. Cyd Silence.  Will continue to monitor.

## 2020-12-26 NOTE — Progress Notes (Signed)
Pharmacy Antibiotic Note  [name] is a 87 y.o. female admitted on 12/25/2020 with perforated duodenal ulcer s/p ex lap 1/17.  Pharmacy has been consulted for zosyn, vanc and fluconazole dosing. -SCr= 1.82 (BL 0.7 in 2013), UOP ~ 1800, CrCl ~ 20  Plan: Vancomycin 500mg  IV q24h Zosyn 3.375g IV every 8 hours (extended infusion) Fluconazole 200 mg IV every 24 hours Monitor renal function, Cx and clinical progression to narrow Vancomycin level as needed  Height: 5\' 3"  (160 cm) Weight: 56 kg (123 lb 7.3 oz) IBW/kg (Calculated) : 52.4  Temp (24hrs), Avg:98 F (36.7 C), Min:97.2 F (36.2 C), Max:98.8 F (37.1 C)  Recent Labs  Lab 12/25/20 1240 12/25/20 1250 12/25/20 1255 12/25/20 2039 12/26/20 0040  WBC  --  16.2*  --   --  16.9*  CREATININE  --  2.14* 2.00*  --  1.82*  LATICACIDVEN 1.7  --   --  1.3  --     Estimated Creatinine Clearance: 18.4 mL/min (A) (by C-G formula based on SCr of 1.82 mg/dL (H)).    No Known Allergies  Antimicrobials this admission: Zosyn 1/17>> Vanc 1/17>> Fluc 1/17>>  Dose adjustments this admission: n/a  Microbiology results: n/a  Hildred Laser, PharmD Clinical Pharmacist **Pharmacist phone directory can now be found on [email or web address] ([email or web address]).  Listed under [place].

## 2020-12-26 NOTE — Progress Notes (Signed)
Progress Note  1 Day Post-Op  Subjective: Patient reports some mild abdominal pain but not severe. NGT removed overnight. Some nausea intermittently but improving. Ice chips at bedside, discussed to limit these. No family at bedside this AM.   Objective: Vital signs in last 24 hours: Temp:  [97.2 F (36.2 C)-98.4 F (36.9 C)] 98.4 F (36.9 C) (01/18 0358) Pulse Rate:  [75-110] 75 (01/18 0700) Resp:  [11-30] 18 (01/18 0700) BP: (112-207)/(56-152) 112/60 (01/18 0700) SpO2:  [92 %-97 %] 93 % (01/18 0700) Weight:  [56 kg] 56 kg (01/17 2022)    Intake/Output from previous day: 01/17 0701 - 01/18 0700 In: 2110 [I.V.:1400; NG/GT:30; IV Piggyback:650] Out: 2065 [Urine:1850; Drains:115; Blood:100] Intake/Output this shift: No intake/output data recorded.  PE: General: pleasant, WD, thin female who is laying in bed in NAD HEENT: head is normocephalic, atraumatic.  Sclera are anicteric.  PERRL.  Ears and nose without any masses or lesions.  Mouth is pink and moist Heart: regular, rate, and rhythm.   Lungs: CTAB, no wheezes, rhonchi, or rales noted.  Respiratory effort nonlabored Abd: soft, appropriately ttp, ND, +BS, drain in RLQ with SS fluid, J tube clamped, midline incision c/d/i with surgical dressing present  MS: all 4 extremities are symmetrical with no cyanosis, clubbing, or edema. Skin: warm and dry with no masses, lesions, or rashes Neuro: Cranial nerves 2-12 grossly intact, sensation is normal throughout Psych: A&Ox3 with an appropriate affect.    Lab Results:  Recent Labs    12/25/20 1250 12/25/20 1255 12/26/20 0040  WBC 16.2*  --  16.9*  HGB 12.7 13.6 10.7*  HCT 39.1 40.0 32.0*  PLT 220  --  174   BMET Recent Labs    12/25/20 1250 12/25/20 1255 12/26/20 0040  NA 137 139 139  K 4.5 4.6 5.1  CL 104 107 108  CO2 18*  --  20*  GLUCOSE 159* 155* 141*  BUN 41* 42* 32*  CREATININE 2.14* 2.00* 1.82*  CALCIUM 8.2*  --  7.3*   PT/INR No results for  input(s): LABPROT, INR in the last 72 hours. CMP     Component Value Date/Time   NA 139 12/26/2020 0040   K 5.1 12/26/2020 0040   CL 108 12/26/2020 0040   CO2 20 (L) 12/26/2020 0040   GLUCOSE 141 (H) 12/26/2020 0040   BUN 32 (H) 12/26/2020 0040   CREATININE 1.82 (H) 12/26/2020 0040   CALCIUM 7.3 (L) 12/26/2020 0040   PROT 5.6 (L) 12/26/2020 0040   ALBUMIN 3.3 (L) 12/26/2020 0040   AST 23 12/26/2020 0040   ALT 13 12/26/2020 0040   ALKPHOS 31 (L) 12/26/2020 0040   BILITOT 0.8 12/26/2020 0040   GFRNONAA 27 (L) 12/26/2020 0040   GFRAA >90 08/01/2012 0436   Lipase  No results found for: LIPASE     Studies/Results: CT ABDOMEN PELVIS WO CONTRAST  Result Date: 12/25/2020 CLINICAL DATA:  Acute abdominal pain nonlocalized EXAM: CT ABDOMEN AND PELVIS WITHOUT CONTRAST TECHNIQUE: Multidetector CT imaging of the abdomen and pelvis was performed following the standard protocol without IV contrast. COMPARISON:  CT abdomen pelvis Apr 27, 2012. FINDINGS: Lower chest: Tiny right pleural effusion with bibasilar subsegmental atelectasis. Hepatobiliary: Slightly increased size of the hypoattenuating lesion in the left lobe of the liver which now measures 2.3 cm previously 1.7 cm (series 3 image 22). No significant change in size of the hypoattenuating lesion in the caudate lobe which measures 1.1 cm previously 1.0 cm (series 3 image  25). Increased size of a lesion in the posterior aspect of the right lobe of the liver which now measures 1.0 cm previously 0.4 cm (series 3, image 31). The gallbladder is unremarkable. No biliary ductal dilatation. Pancreas: There are few pancreatic calcifications otherwise the pancreas is grossly unremarkable. Spleen: Normal in size without focal abnormality. Adrenals/Urinary Tract: Adrenal glands are unremarkable. Hypoattenuating exophytic 1.1 cm left interpolar renal lesion (series 3 image 27) and a hypoattenuating exophytic 1.5 cm left upper pole renal lesion (series 3,  image 23). No left renal stones or hydronephrosis. The right kidney is normal, without renal calculi, focal lesion, or hydronephrosis. Bladder is unremarkable. Stomach/Bowel: Stomach is predominantly decompressed limiting evaluation. Though difficult to evaluate completely there appears to be low-density wall thickening with adjacent free fluid and gas along the proximal duodenum. The remainder of the small bowel appears unremarkable. Colonic diverticulosis. Without definite findings of diverticulitis. Small volume of formed stool in the colon. Anastomotic suture in the left hemipelvis. Vascular/Lymphatic: Extensive aortic and branch vessel atherosclerosis. There is infrarenal abdominal aortic aneurysm measuring up to 4.3 cm in maximum axial diameter with a previous maximum diameter of 2.9 cm in 2013. No visualized pathologically enlarged lymph nodes. Reproductive: Status post hysterectomy. No adnexal masses. Other: Pneumoperitoneum with small volume ascites predominant long the right pericolic gutter. Musculoskeletal: Multilevel degenerative changes spine. No acute osseous abnormality. IMPRESSION: 1. Pneumoperitoneum with small volume ascites predominant long the right pericolic gutter. Though difficult to evaluate completely there appears to be low-density wall thickening with adjacent free fluid and gas along the proximal duodenum. Findings are concerning for duodenal perforation. 2. Infrarenal abdominal aortic aneurysm measuring up to 4.3 cm in maximum axial diameter with a previous maximum diameter of 2.9 cm in 2013. Recommend follow-up every 12 months and vascular consultation. This recommendation follows ACR consensus guidelines: White Paper of the ACR Incidental Findings Committee II on Vascular Findings. J Am Coll Radiol 2013; 10:789-794. 3. Slightly increased size of the hypoattenuating lesion in the left lobe of the liver which now measures 2.3 cm previously 1.7 cm. Increased size of a lesion in the  posterior aspect of the right lobe of the liver which now measures 1.0 cm previously 0.4 cm in 2013. Findings are nonspecific, however the rather slow growth over 9 years would indicate a more benign etiology these would more completely evaluated with liver protocol MRI or CT with and without contrast. 4. Hypoattenuating exophytic left renal lesions measuring up to 1.5 cm, which appear relatively homogeneous and favored to represent renal cysts. However these are incompletely evaluated on today's study and new from prior study. Further evaluation with non-emergent renal ultrasound is recommended, alternately these could be further evaluated on CT or MRI if utilized for evaluation hepatic lesions. 5. Tiny right pleural effusion with bibasilar subsegmental atelectasis. 6. Aortic atherosclerosis. Aortic Atherosclerosis (ICD10-I70.0). These results were called by telephone at the time of interpretation on 12/25/2020 at 2:25 pm to provider [facility] , who verbally acknowledged these results. Electronically Signed   By: Dahlia Bailiff MD   On: 12/25/2020 14:47   DG Chest Portable 1 View  Result Date: 12/25/2020 CLINICAL DATA:  Abdominal pain since this morning EXAM: PORTABLE CHEST 1 VIEW COMPARISON:  Portable exam 1255 hours compared to 03/29/2015 FINDINGS: Normal heart size, mediastinal contours, and pulmonary vascularity. Atherosclerotic calcification of a tortuous thoracic aorta. Mild chronic accentuation of markings in the RIGHT upper lobe likely reflecting scarring. No acute infiltrate, pleural effusion, or pneumothorax. Bones demineralized. IMPRESSION: Mild  RIGHT upper lobe scarring. No acute abnormalities. Aortic Atherosclerosis (ICD10-I70.0). Electronically Signed   By: Lavonia Dana M.D.   On: 12/25/2020 13:05   DG Abd Portable 2 Views  Result Date: 12/25/2020 CLINICAL DATA:  Abdominal pain, history of abdominal aortic aneurysm EXAM: PORTABLE ABDOMEN - 2 VIEW COMPARISON:  06/24/2012 Aortic ultrasound  01/30/2018 FINDINGS: Lung bases clear. Nonobstructive bowel gas pattern. No bowel dilatation, bowel wall thickening, or free air. Bones demineralized with multilevel degenerative disc/facet disease changes of thoracolumbar spine and levoconvex scoliosis. Curvilinear LEFT paraspinal calcifications consistent with history of known abdominal aortic aneurysm, though size of aneurysm cannot be assessed by this exam. IMPRESSION: Normal bowel gas pattern. Curvilinear atherosclerotic calcifications at known abdominal aortic aneurysm, though size of aneurysm cannot be assessed radiographically. Aneurysm measured 3.3 cm diameter on an ultrasound exam from 2019 If there is clinical concern for complication of aortic aneurysm or acute enlargement, consider CT. Electronically Signed   By: Lavonia Dana M.D.   On: 12/25/2020 13:11    Anti-infectives: Anti-infectives (From admission, onward)   Start     Dose/Rate Route Frequency Ordered Stop   12/25/20 2200  piperacillin-tazobactam (ZOSYN) IVPB 3.375 g        3.375 g 12.5 mL/hr over 240 Minutes Intravenous Every 8 hours 12/25/20 1517     12/25/20 1530  vancomycin (VANCOREADY) IVPB 1250 mg/250 mL        1,250 mg 166.7 mL/hr over 90 Minutes Intravenous  Once 12/25/20 1517 12/25/20 1758   12/25/20 1530  fluconazole (DIFLUCAN) IVPB 200 mg        200 mg 100 mL/hr over 60 Minutes Intravenous Every 24 hours 12/25/20 1517     12/25/20 1516  vancomycin variable dose per unstable renal function (pharmacist dosing)         Does not apply See admin instructions 12/25/20 1517     12/25/20 1430  piperacillin-tazobactam (ZOSYN) IVPB 3.375 g        3.375 g 100 mL/hr over 30 Minutes Intravenous  Once 12/25/20 1427 12/25/20 1612       Assessment/Plan Hx of colon cancer - s/p resection and colostomy takedown HTN HLD Chronic pain  AKI - Cr 1.82 from baseline of 0.69, improving but continue to monitor Peripheral vascular disease AAA - infrarenal 4.3 cm - f/u per radiology  guidelines Osteoporosis ABL anemia - hgb 10.7 from 12.7 on admit, continue to trend   Pneumoperitoneum with duodenal perforation  S/p exploratory laparotomy, Phillip Heal patch, placement of jejunostomy 12/25/20 Dr. Zenia Resides - POD#1 - strict NPO - limited ice chips to keep mouth moistened only, especially without NGT - JP with SS fluid, continue for now - continue abx for total of 5 days - will plan UGI on POD#3 to evaluate for leak - start to mobilize today and d/c foley  FEN: NPO, IVF at 75 cc/h VTE: SQ heparin ID: Zosyn/vanc 1/17>> Foley: remove today Follow up: Dr. Zenia Resides   LOS: 1 day    Norm Parcel , [facility] Surgery 12/26/2020, 8:04 AM Please see Amion for pager number during day hours 7:00am-4:30pm

## 2020-12-26 NOTE — Plan of Care (Signed)
  Problem: Clinical Measurements: Goal: Ability to maintain clinical measurements within normal limits will improve Outcome: Progressing Goal: Postoperative complications will be avoided or minimized Outcome: Progressing   Problem: Skin Integrity: Goal: Demonstration of wound healing without infection will improve Outcome: Progressing   

## 2020-12-26 NOTE — Evaluation (Signed)
Physical Therapy Evaluation Patient Details Name: [name] MRN: [id number] DOB: 29-Jul-1932 Today's Date: 12/26/2020   History of Present Illness  Patient is a 87 y/o female who presents with abdominal pain. Found to have perforated duodenal ulcer now s/p exp laparatomy, Graham patch repair and G-tube placement 12/26/2020. PMH includes HTN and colon CA s/p partial colectomy.  Clinical Impression  Patient presents with pain, impaired cognition, decreased activity tolerance, impaired balance and impaired mobility s/p above. Pt reports living alone and being independent for ADLs and ambulation PTA. Today, mobility limited due to pain. Requires Mod-max A for rolling and bed mobility with step by step cues for sequencing. Education re: bracing and log roll technique. Pt coughing up bloody/yellow phlegm once upright x2. Anticipate pt will progress well once pain is controlled and cognition improves as pt slightly confused this session. At this time, would benefit from SNF to maximize independence and mobility prior to return home. Will follow acutely.    Follow Up Recommendations SNF;Supervision for mobility/OOB    Equipment Recommendations  None recommended by PT (TBA)    Recommendations for Other Services OT consult     Precautions / Restrictions Precautions Precautions: Other (comment);Fall Precaution Comments: JP drain Required Braces or Orthoses: Other Brace Other Brace: abdominal binder Restrictions Weight Bearing Restrictions: No      Mobility  Bed Mobility Overal bed mobility: Needs Assistance Bed Mobility: Rolling;Sidelying to Sit;Sit to Sidelying Rolling: Mod assist Sidelying to sit: Max assist;HOB elevated     Sit to sidelying: Mod assist;+2 for physical assistance General bed mobility comments: Step by step cues for sequencing, to flex right knee and reach for rail on left. Mod A to roll towards left and Max A to elevate trunk to get to EOB. No dizziness but  increased pain sitting upright needing BUE support and Mod A for balance.    Transfers                 General transfer comment: Deferred due to intolerable pain sitting upright.  Ambulation/Gait             General Gait Details: Unable at this time, pain limiting  Stairs            Wheelchair Mobility    Modified Rankin (Stroke Patients Only)       Balance Overall balance assessment: Needs assistance Sitting-balance support: Feet supported;Bilateral upper extremity supported Sitting balance-Leahy Scale: Poor Sitting balance - Comments: Requires Mod A for static sitting balance with BUE support. Limited sitting tolerance due to pain in abdomen. Postural control: Posterior lean                                   Pertinent Vitals/Pain Pain Assessment: 0-10 Pain Score: 10-Worst pain ever (20/10) Pain Location: abdomen Pain Descriptors / Indicators: Sore;Guarding;Grimacing;Moaning Pain Intervention(s): Monitored during session;Repositioned;RN gave pain meds during session;Limited activity within patient's tolerance    Home Living Family/patient expects to be discharged to:: Private residence Living Arrangements: Alone Available Help at Discharge: Family;Available PRN/intermittently Type of Home: Other(Comment) (townhome) Home Access: Level entry     Home Layout: One level Home Equipment: Shower seat - built in      Prior Function Level of Independence: Independent         Comments: daughter lives down the street. Helps with IADLs, groceries, pt drives.     Hand Dominance   Dominant Hand: Right  Extremity/Trunk Assessment   Upper Extremity Assessment Upper Extremity Assessment: Defer to OT evaluation    Lower Extremity Assessment Lower Extremity Assessment: Generalized weakness (Grossly ~3/5 throughout)       Communication   Communication: HOH  Cognition Arousal/Alertness: Lethargic Behavior During Therapy: Flat  affect Overall Cognitive Status: Impaired/Different from baseline Area of Impairment: Orientation;Attention;Memory;Following commands;Safety/judgement;Problem solving                 Orientation Level: Disoriented to;Person Current Attention Level: Focused Memory: Decreased short-term memory Following Commands: Follows one step commands with increased time;Follows one step commands inconsistently Safety/Judgement: Decreased awareness of deficits;Decreased awareness of safety   Problem Solving: Slow processing;Decreased initiation;Requires verbal cues;Requires tactile cues General Comments: not able to state correct birthday; "[employer]" for location. Pt was just sleeping prior to arrival possibly leading to confusion?      General Comments General comments (skin integrity, edema, etc.): VSS on RA. Bleeding noted around surgical site.    Exercises     Assessment/Plan    PT Assessment Patient needs continued PT services  PT Problem List Decreased strength;Decreased mobility;Decreased skin integrity;Decreased cognition;Decreased activity tolerance;Decreased balance;Decreased knowledge of use of DME;Pain       PT Treatment Interventions Therapeutic exercise;Gait training;Balance training;Cognitive remediation;Therapeutic activities;Patient/family education;Functional mobility training;DME instruction    PT Goals (Current goals can be found in the Care Plan section)  Acute Rehab PT Goals Patient Stated Goal: better pain control PT Goal Formulation: With patient Time For Goal Achievement: 01/09/21 Potential to Achieve Goals: Fair    Frequency Min 3X/week   Barriers to discharge Decreased caregiver support lives alone    Co-evaluation               AM-PAC PT "6 Clicks" Mobility  Outcome Measure Help needed turning from your back to your side while in a flat bed without using bedrails?: A Lot Help needed moving from lying on your back to sitting on the side of a  flat bed without using bedrails?: Total Help needed moving to and from a bed to a chair (including a wheelchair)?: A Little Help needed standing up from a chair using your arms (e.g., wheelchair or bedside chair)?: A Little Help needed to walk in hospital room?: A Little Help needed climbing 3-5 steps with a railing? : A Lot 6 Click Score: 14    End of Session Equipment Utilized During Treatment: Other (comment) (abdominal binder) Activity Tolerance: Patient limited by pain Patient left: in bed;with call bell/phone within reach;with bed alarm set;with SCD's reapplied Nurse Communication: Mobility status;Patient requests pain meds PT Visit Diagnosis: Pain;Other abnormalities of gait and mobility (R26.89) Pain - part of body:  (abdomen)    Time: 6010-9323 PT Time Calculation (min) (ACUTE ONLY): 22 min   Charges:   PT Evaluation $PT Eval Moderate Complexity: 1 Mod          Marisa Severin, PT, DPT Acute Rehabilitation Services Pager [phone] Office [phone]      Marguarite Arbour A Sabra Heck 12/26/2020, 3:15 PM

## 2020-12-26 NOTE — Progress Notes (Signed)
Progress Note    [name]  VOH:[id number] DOB: 10-07-1932  DOA: 12/25/2020 PCP: Jonathon Jordan, MD    Brief Narrative:     Medical records reviewed and are as summarized below:  [name] is an 87 y.o. female with medical history significant of HTN, HLD, colon CA status post partial colectomy, presented with worsening of abdominal pain. Found to have a perforated duodenal ulcer and underwent an exploratory laparotomy with graham patch repair of ulcer.  Assessment/Plan:   Active Problems:   Peritonitis ([place])   Perforated duodenal ulcer ([place])   AKI (acute kidney injury) ([place])   Bowel perforation (HCC)   Acute peritonitis secondary to perforated duodenal with leukocytosis -Antibiotic coverage for upper GI perforation, vancomycin, Zosyn and fluconazole, adjust according to culture from surgery -PPI drip for 72 hours and then change to oral if able -J tube placed for feeding -general surgery consult appreciated: s/p repair of ex lap with repair  AKI -gentle IVF -daily labs  Frequent PVCs -replete MG -PRN BB until able to take PO meds  Uncontrolled hypertension - IV labetalol as needed for now  History of AAA -Somewhat enlargement of size of the AAA -outpatient vascular surgery for follow-up.   Family Communication/Anticipated D/C date and plan/Code Status   DVT prophylaxis: heparin Code Status: Full Code.  Disposition Plan: Status is: Inpatient  Remains inpatient appropriate because:Inpatient level of care appropriate due to severity of illness   Dispo: The patient is from: Home              Anticipated d/c is to: Home              Anticipated d/c date is: > 3 days              Patient currently is not medically stable to d/c.         Medical Consultants:    General surgery     Subjective:   Pain controlled  Objective:    Vitals:   12/25/20 2322 12/26/20 0358 12/26/20 0600 12/26/20 0700  BP: (!) 151/69 (!)  128/57 (!) 121/56 112/60  Pulse: 81 77 79 75  Resp: 17 20 (!) 25 18  Temp: 97.9 F (36.6 C) 98.4 F (36.9 C)    TempSrc: Oral Oral    SpO2: 95% 94% 94% 93%  Weight:      Height:        Intake/Output Summary (Last 24 hours) at 12/26/2020 0956 Last data filed at 12/26/2020 0930 Gross per 24 hour  Intake 2110 ml  Output 2215 ml  Net -105 ml   Filed Weights   12/25/20 2022  Weight: 56 kg    Exam:  General: Appearance:    Well developed, well nourished female in no acute distress   +BS, J tube in place and clamped, drain on right  Lungs:     Clear to auscultation bilaterally, respirations unlabored  Heart:    Normal heart rate. Normal rhythm. No murmurs, rubs, or gallops.   MS:   All extremities are intact.   Neurologic:   Awake, alert, oriented x 3. No apparent focal neurological           defect.     Data Reviewed:   I have personally reviewed following labs and imaging studies:  Labs: Labs show the following:   Basic Metabolic Panel: Recent Labs  Lab 12/25/20 1250 12/25/20 1255 12/25/20 2039 12/26/20 0040  NA 137 139  --  139  K 4.5 4.6  --  5.1  CL 104 107  --  108  CO2 18*  --   --  20*  GLUCOSE 159* 155*  --  141*  BUN 41* 42*  --  32*  CREATININE 2.14* 2.00*  --  1.82*  CALCIUM 8.2*  --   --  7.3*  MG  --   --  1.7 1.7  PHOS  --   --  3.2  --    GFR Estimated Creatinine Clearance: 18.4 mL/min (A) (by C-G formula based on SCr of 1.82 mg/dL (H)). Liver Function Tests: Recent Labs  Lab 12/25/20 1250 12/26/20 0040  AST 16 23  ALT 10 13  ALKPHOS 47 31*  BILITOT 0.9 0.8  PROT 6.4* 5.6*  ALBUMIN 3.6 3.3*   No results for input(s): LIPASE, AMYLASE in the last 168 hours. No results for input(s): AMMONIA in the last 168 hours. Coagulation profile No results for input(s): INR, PROTIME in the last 168 hours.  CBC: Recent Labs  Lab 12/25/20 1250 12/25/20 1255 12/26/20 0040  WBC 16.2*  --  16.9*  NEUTROABS 14.3*  --   --   HGB 12.7 13.6 10.7*   HCT 39.1 40.0 32.0*  MCV 96.3  --  93.3  PLT 220  --  174   Cardiac Enzymes: No results for input(s): CKTOTAL, CKMB, CKMBINDEX, TROPONINI in the last 168 hours. BNP (last 3 results) No results for input(s): PROBNP in the last 8760 hours. CBG: No results for input(s): GLUCAP in the last 168 hours. D-Dimer: No results for input(s): DDIMER in the last 72 hours. Hgb A1c: No results for input(s): HGBA1C in the last 72 hours. Lipid Profile: No results for input(s): CHOL, HDL, LDLCALC, TRIG, CHOLHDL, LDLDIRECT in the last 72 hours. Thyroid function studies: No results for input(s): TSH, T4TOTAL, T3FREE, THYROIDAB in the last 72 hours.  Invalid input(s): FREET3 Anemia work up: No results for input(s): VITAMINB12, FOLATE, FERRITIN, TIBC, IRON, RETICCTPCT in the last 72 hours. Sepsis Labs: Recent Labs  Lab 12/25/20 1240 12/25/20 1250 12/25/20 2039 12/26/20 0040  WBC  --  16.2*  --  16.9*  LATICACIDVEN 1.7  --  1.3  --     Microbiology Recent Results (from the past 240 hour(s))  SARS Coronavirus 2 by RT PCR (hospital order, performed in [place] hospital lab)     Status: None   Collection Time: 12/25/20  3:12 PM  Result Value Ref Range Status   SARS Coronavirus 2 NEGATIVE NEGATIVE Final    Comment: (NOTE) SARS-CoV-2 target nucleic acids are NOT DETECTED.  The SARS-CoV-2 RNA is generally detectable in upper and lower respiratory specimens during the acute phase of infection. The lowest concentration of SARS-CoV-2 viral copies this assay can detect is 250 copies / mL. A negative result does not preclude SARS-CoV-2 infection and should not be used as the sole basis for treatment or other patient management decisions.  A negative result may occur with improper specimen collection / handling, submission of specimen other than nasopharyngeal swab, presence of viral mutation(s) within the areas targeted by this assay, and inadequate number of viral copies (<250 copies / mL). A  negative result must be combined with clinical observations, patient history, and epidemiological information.  Fact Sheet for Patients:   StrictlyIdeas.no  Fact Sheet for Healthcare Providers: BankingDealers.co.za  This test is not yet approved or  cleared by the [place] FDA and has been authorized for detection and/or diagnosis of SARS-CoV-2 by FDA  under an Emergency Use Authorization (EUA).  This EUA will remain in effect (meaning this test can be used) for the duration of the COVID-19 declaration under Section 564(b)(1) of the Act, 21 U.S.C. section 360bbb-3(b)(1), unless the authorization is terminated or revoked sooner.  Performed at [facility], [place] [address] [place], [place] [address]     Procedures and diagnostic studies:  CT ABDOMEN PELVIS WO CONTRAST  Result Date: 12/25/2020 CLINICAL DATA:  Acute abdominal pain nonlocalized EXAM: CT ABDOMEN AND PELVIS WITHOUT CONTRAST TECHNIQUE: Multidetector CT imaging of the abdomen and pelvis was performed following the standard protocol without IV contrast. COMPARISON:  CT abdomen pelvis Apr 27, 2012. FINDINGS: Lower chest: Tiny right pleural effusion with bibasilar subsegmental atelectasis. Hepatobiliary: Slightly increased size of the hypoattenuating lesion in the left lobe of the liver which now measures 2.3 cm previously 1.7 cm (series 3 image 22). No significant change in size of the hypoattenuating lesion in the caudate lobe which measures 1.1 cm previously 1.0 cm (series 3 image 25). Increased size of a lesion in the posterior aspect of the right lobe of the liver which now measures 1.0 cm previously 0.4 cm (series 3, image 31). The gallbladder is unremarkable. No biliary ductal dilatation. Pancreas: There are few pancreatic calcifications otherwise the pancreas is grossly unremarkable. Spleen: Normal in size without focal abnormality. Adrenals/Urinary Tract: Adrenal glands  are unremarkable. Hypoattenuating exophytic 1.1 cm left interpolar renal lesion (series 3 image 27) and a hypoattenuating exophytic 1.5 cm left upper pole renal lesion (series 3, image 23). No left renal stones or hydronephrosis. The right kidney is normal, without renal calculi, focal lesion, or hydronephrosis. Bladder is unremarkable. Stomach/Bowel: Stomach is predominantly decompressed limiting evaluation. Though difficult to evaluate completely there appears to be low-density wall thickening with adjacent free fluid and gas along the proximal duodenum. The remainder of the small bowel appears unremarkable. Colonic diverticulosis. Without definite findings of diverticulitis. Small volume of formed stool in the colon. Anastomotic suture in the left hemipelvis. Vascular/Lymphatic: Extensive aortic and branch vessel atherosclerosis. There is infrarenal abdominal aortic aneurysm measuring up to 4.3 cm in maximum axial diameter with a previous maximum diameter of 2.9 cm in 2013. No visualized pathologically enlarged lymph nodes. Reproductive: Status post hysterectomy. No adnexal masses. Other: Pneumoperitoneum with small volume ascites predominant long the right pericolic gutter. Musculoskeletal: Multilevel degenerative changes spine. No acute osseous abnormality. IMPRESSION: 1. Pneumoperitoneum with small volume ascites predominant long the right pericolic gutter. Though difficult to evaluate completely there appears to be low-density wall thickening with adjacent free fluid and gas along the proximal duodenum. Findings are concerning for duodenal perforation. 2. Infrarenal abdominal aortic aneurysm measuring up to 4.3 cm in maximum axial diameter with a previous maximum diameter of 2.9 cm in 2013. Recommend follow-up every 12 months and vascular consultation. This recommendation follows ACR consensus guidelines: White Paper of the ACR Incidental Findings Committee II on Vascular Findings. J Am Coll Radiol 2013;  10:789-794. 3. Slightly increased size of the hypoattenuating lesion in the left lobe of the liver which now measures 2.3 cm previously 1.7 cm. Increased size of a lesion in the posterior aspect of the right lobe of the liver which now measures 1.0 cm previously 0.4 cm in 2013. Findings are nonspecific, however the rather slow growth over 9 years would indicate a more benign etiology these would more completely evaluated with liver protocol MRI or CT with and without contrast. 4. Hypoattenuating exophytic left renal lesions measuring up to  1.5 cm, which appear relatively homogeneous and favored to represent renal cysts. However these are incompletely evaluated on today's study and new from prior study. Further evaluation with non-emergent renal ultrasound is recommended, alternately these could be further evaluated on CT or MRI if utilized for evaluation hepatic lesions. 5. Tiny right pleural effusion with bibasilar subsegmental atelectasis. 6. Aortic atherosclerosis. Aortic Atherosclerosis (ICD10-I70.0). These results were called by telephone at the time of interpretation on 12/25/2020 at 2:25 pm to provider [facility] , who verbally acknowledged these results. Electronically Signed   By: Dahlia Bailiff MD   On: 12/25/2020 14:47   DG Chest Portable 1 View  Result Date: 12/25/2020 CLINICAL DATA:  Abdominal pain since this morning EXAM: PORTABLE CHEST 1 VIEW COMPARISON:  Portable exam 1255 hours compared to 03/29/2015 FINDINGS: Normal heart size, mediastinal contours, and pulmonary vascularity. Atherosclerotic calcification of a tortuous thoracic aorta. Mild chronic accentuation of markings in the RIGHT upper lobe likely reflecting scarring. No acute infiltrate, pleural effusion, or pneumothorax. Bones demineralized. IMPRESSION: Mild RIGHT upper lobe scarring. No acute abnormalities. Aortic Atherosclerosis (ICD10-I70.0). Electronically Signed   By: Lavonia Dana M.D.   On: 12/25/2020 13:05   DG Abd Portable  2 Views  Result Date: 12/25/2020 CLINICAL DATA:  Abdominal pain, history of abdominal aortic aneurysm EXAM: PORTABLE ABDOMEN - 2 VIEW COMPARISON:  06/24/2012 Aortic ultrasound 01/30/2018 FINDINGS: Lung bases clear. Nonobstructive bowel gas pattern. No bowel dilatation, bowel wall thickening, or free air. Bones demineralized with multilevel degenerative disc/facet disease changes of thoracolumbar spine and levoconvex scoliosis. Curvilinear LEFT paraspinal calcifications consistent with history of known abdominal aortic aneurysm, though size of aneurysm cannot be assessed by this exam. IMPRESSION: Normal bowel gas pattern. Curvilinear atherosclerotic calcifications at known abdominal aortic aneurysm, though size of aneurysm cannot be assessed radiographically. Aneurysm measured 3.3 cm diameter on an ultrasound exam from 2019 If there is clinical concern for complication of aortic aneurysm or acute enlargement, consider CT. Electronically Signed   By: Lavonia Dana M.D.   On: 12/25/2020 13:11    Medications:   . Chlorhexidine Gluconate Cloth  6 each Topical Daily  . heparin  5,000 Units Subcutaneous Q8H  . rosuvastatin  10 mg Oral QPM  . vancomycin variable dose per unstable renal function (pharmacist dosing)   Does not apply See admin instructions   Continuous Infusions: . sodium chloride 125 mL/hr at 12/25/20 2026  . acetaminophen    . fluconazole (DIFLUCAN) IV 200 mg (12/25/20 1614)  . magnesium sulfate bolus IVPB    . methocarbamol (ROBAXIN) IV    . pantoprozole (PROTONIX) infusion    . piperacillin-tazobactam (ZOSYN)  IV 3.375 g (12/26/20 0615)     LOS: 1 day   Geradine Girt  [facility]   How to contact the [facility] Attending or Consulting provider [place] or covering provider during after hours [place], for this patient?  1. Check the care team in [facility] and look for a) attending/consulting TR[facility] provider listed and b) the [facility] team listed 2. Log into [email or web address] and use [place]  universal password to access. If you do not have the password, please contact the hospital operator. 3. Locate the [facility] provider you are looking for under [facility] and page to a number that you can be directly reached. 4. If you still have difficulty reaching the provider, please page the [facility] (Director on Call) for the Hospitalists listed on amion for assistance.  12/26/2020, 9:56 AM

## 2020-12-27 DIAGNOSIS — K659 Peritonitis, unspecified: Secondary | ICD-10-CM | POA: Diagnosis not present

## 2020-12-27 DIAGNOSIS — K265 Chronic or unspecified duodenal ulcer with perforation: Secondary | ICD-10-CM | POA: Diagnosis not present

## 2020-12-27 DIAGNOSIS — N179 Acute kidney failure, unspecified: Secondary | ICD-10-CM | POA: Diagnosis not present

## 2020-12-27 LAB — COMPREHENSIVE METABOLIC PANEL
ALT: 11 U/L (ref 0–44)
AST: 15 U/L (ref 15–41)
Albumin: 2.7 g/dL — ABNORMAL LOW (ref 3.5–5.0)
Alkaline Phosphatase: 33 U/L — ABNORMAL LOW (ref 38–126)
Anion gap: 11 (ref 5–15)
BUN: 29 mg/dL — ABNORMAL HIGH (ref 8–23)
CO2: 18 mmol/L — ABNORMAL LOW (ref 22–32)
Calcium: 7.2 mg/dL — ABNORMAL LOW (ref 8.9–10.3)
Chloride: 107 mmol/L (ref 98–111)
Creatinine, Ser: 1.73 mg/dL — ABNORMAL HIGH (ref 0.44–1.00)
GFR, Estimated: 28 mL/min — ABNORMAL LOW (ref >60)
Glucose, Bld: 85 mg/dL (ref 70–99)
Potassium: 4 mmol/L (ref 3.5–5.1)
Sodium: 136 mmol/L (ref 135–145)
Total Bilirubin: 1.2 mg/dL (ref 0.3–1.2)
Total Protein: 5.2 g/dL — ABNORMAL LOW (ref 6.5–8.1)

## 2020-12-27 LAB — CBC
HCT: 29.7 % — ABNORMAL LOW (ref 36.0–46.0)
Hemoglobin: 9.5 g/dL — ABNORMAL LOW (ref 12.0–15.0)
MCH: 30.2 pg (ref 26.0–34.0)
MCHC: 32 g/dL (ref 30.0–36.0)
MCV: 94.3 fL (ref 80.0–100.0)
Platelets: 145 10*3/uL — ABNORMAL LOW (ref 150–400)
RBC: 3.15 MIL/uL — ABNORMAL LOW (ref 3.87–5.11)
RDW: 12.7 % (ref 11.5–15.5)
WBC: 13.9 10*3/uL — ABNORMAL HIGH (ref 4.0–10.5)
nRBC: 0 % (ref 0.0–0.2)

## 2020-12-27 LAB — PREALBUMIN: Prealbumin: 9.1 mg/dL — ABNORMAL LOW (ref 18–38)

## 2020-12-27 MED ORDER — OSMOLITE 1.2 CAL PO LIQD
1000.0000 mL | ORAL | Status: DC
  Administered 2020-12-27: 1000 mL
  Filled 2020-12-27 (×2): qty 1000

## 2020-12-27 MED ORDER — METOPROLOL TARTRATE 5 MG/5ML IV SOLN
2.5000 mg | Freq: Four times a day (QID) | INTRAVENOUS | Status: DC
  Administered 2020-12-27 – 2020-12-29 (×8): 2.5 mg via INTRAVENOUS
  Filled 2020-12-27 (×8): qty 5

## 2020-12-27 MED ORDER — PANTOPRAZOLE SODIUM 40 MG IV SOLR
40.0000 mg | Freq: Two times a day (BID) | INTRAVENOUS | Status: DC
  Administered 2020-12-27 – 2020-12-31 (×9): 40 mg via INTRAVENOUS
  Filled 2020-12-27 (×9): qty 40

## 2020-12-27 MED ORDER — MAGNESIUM SULFATE 2 GM/50ML IV SOLN
2.0000 g | Freq: Once | INTRAVENOUS | Status: AC
  Administered 2020-12-27: 2 g via INTRAVENOUS
  Filled 2020-12-27: qty 50

## 2020-12-27 NOTE — TOC Initial Note (Signed)
Transition of Care [facility]) - Initial/Assessment Note    Patient Details  Name: [name] MRN: [id number] Date of Birth: [birth year]  Transition of Care [facility]) CM/SW Contact:    Bethann Berkshire, [place] Phone Number: 12/27/2020, 12:36 PM  Clinical Narrative:      CSW met with pt bedside to discuss SNF recommendation. Pt lives at home alone. She initially is apprehensive about SNF and states she can walk around fine. CSW explained PT recommendation and the fact that pt does not have much support at home. Pt agreeable to SNF at this time. She does request CSW follow up with her daughter regarding facilities and decision.   CSW called pt daughter. Daughter feels strongly that her mother needs SNF. She states pt has been living in a townhouse alone and that daughter has been visiting daily. Daughter states that pt has not been going to the doctor and continues to drive against recommendation from her doctor. She states that pt also frequently gets dizzy. Daughter is okay with SNF work up at this time. CSW left SNF medicare list for pt and pt daughter in room. Fl2 complete and bed requests sent in hub.               Expected Discharge Plan: Skilled Nursing Facility Barriers to Discharge: Continued Medical Work up   Patient Goals and CMS Choice Patient states their goals for this hospitalization and ongoing recovery are:: Eventually to return home but pt is agreeable to SNF CMS Medicare.gov Compare Post Acute Care list provided to:: Patient Choice offered to / list presented to : [place]  Expected Discharge Plan and Services Expected Discharge Plan: [place]       Living arrangements for the past 2 months: Apartment                                      Prior Living Arrangements/Services Living arrangements for the past 2 months: Apartment Lives with:: Self          Need for Family Participation in Patient Care: Yes (Comment) Care giver support  system in place?: No (comment)   Criminal Activity/Legal Involvement Pertinent to Current Situation/Hospitalization: No - Comment as needed  Activities of Daily Living Home Assistive Devices/Equipment: None ADL Screening (condition at time of admission) Patient's cognitive ability adequate to safely complete daily activities?: Yes Is the patient deaf or have difficulty hearing?: No Does the patient have difficulty seeing, even when wearing glasses/contacts?: No Does the patient have difficulty concentrating, remembering, or making decisions?: No Patient able to express need for assistance with ADLs?: Yes Does the patient have difficulty dressing or bathing?: No Independently performs ADLs?: Yes (appropriate for developmental age) Does the patient have difficulty walking or climbing stairs?: No Weakness of Legs: None Weakness of Arms/Hands: None  Permission Sought/Granted   Permission granted to share information with : Yes, Verbal Permission Granted  Share Information with NAME: Nash,Linda (Daughter)   [phone] (Mobile           Emotional Assessment Appearance:: Appears stated age Attitude/Demeanor/Rapport: Engaged,Lethargic Affect (typically observed): Adaptable Orientation: : Oriented to Self,Oriented to Place,Oriented to  Time,Oriented to Situation Alcohol / Substance Use: Not Applicable Psych Involvement: No (comment)  Admission diagnosis:  Peritonitis ([place]) [K65.9] Perforated duodenal ulcer ([place]) [K26.5] AKI (acute kidney injury) ([place]) [N17.9] Bowel perforation ([place]) [K63.1] Patient Active Problem List   Diagnosis Date Noted  .  Peritonitis ([place]) 12/25/2020  . Bowel perforation ([place]) 12/25/2020  . Perforated duodenal ulcer ([place])   . AKI (acute kidney injury) ([place])   . Aftercare following surgery of the circulatory system, [place] 08/09/2014  . Occlusion and stenosis of carotid artery without mention of cerebral infarction 07/27/2013  . History of sigmoid colon  cancer, T4, N0. Resected 04/03/2011. 10/30/2011   PCP:  Jonathon Jordan, MD Pharmacy:   [place], [place] - [place] [address] [place] [address] Phone: [phone] Fax: [place], [place] - [place] [address] [place] [address] Phone: [phone] Fax: [phone]  [place], [place] [address] Phone: [phone] Fax: [phone]  [id number] [place] [address]Phone: [phone]: [phone] [id number] [place]: [phone]: [phone]   Social Determinants of Health (SDOH) Interventions    Readmission Risk Interventions No flowsheet data found.

## 2020-12-27 NOTE — [place] FL2 (Signed)
[place] MEDICAID FL2 LEVEL OF CARE SCREENING TOOL     IDENTIFICATION  Patient Name: [name] Birthdate: 1932/06/01 Sex: female Admission Date (Current Location): 12/25/2020  [facility] Inpatient and [place] Number:  Herbalist and Address:  [facility], [place] [address], [place], [place] [address]      Provider Number: [id number]  Attending Physician Name and Address:  Domenic Polite, MD  Relative Name and Phone Number:  Nash,Linda (Daughter)   [phone] [facility])    Current Level of Care: Hospital Recommended Level of Care: [place] Prior Approval Number:    Date Approved/Denied:   PASRR Number: [id number]  Discharge Plan: SNF    Current Diagnoses: Patient Active Problem List   Diagnosis Date Noted  . Peritonitis ([place]) 12/25/2020  . Bowel perforation ([place]) 12/25/2020  . Perforated duodenal ulcer ([place])   . AKI (acute kidney injury) ([place])   . Aftercare following surgery of the circulatory system, [place] 08/09/2014  . Occlusion and stenosis of carotid artery without mention of cerebral infarction 07/27/2013  . History of sigmoid colon cancer, T4, N0. Resected 04/03/2011. 10/30/2011    Orientation RESPIRATION BLADDER Height & Weight     Self,Time,Situation,Place  Normal Continent Weight: 123 lb 7.3 oz (56 kg) Height:  5\' 3"  (160 cm)  BEHAVIORAL SYMPTOMS/MOOD NEUROLOGICAL BOWEL NUTRITION STATUS      Continent Diet,Feeding tube (See d/c summary. Pt has J tube (Jejunostomy))  AMBULATORY STATUS COMMUNICATION OF NEEDS Skin   Extensive Assist Verbally Surgical wounds,Other (Comment) (Incision abdomen; JP drain right abdomen)                       Personal Care Assistance Level of Assistance  Bathing,Feeding,Dressing Bathing Assistance: Limited assistance Feeding assistance: Independent Dressing Assistance: Limited assistance     Functional Limitations Info  Sight,Hearing,Speech Sight Info: Adequate Hearing Info:  Adequate Speech Info: Adequate    SPECIAL CARE FACTORS FREQUENCY  PT (By licensed PT),OT (By licensed OT)     PT Frequency: 5x/week OT Frequency: 5x/week            Contractures Contractures Info: Not present    Additional Factors Info  Code Status,Allergies Code Status Info: Full code Allergies Info: No known allergies           Current Medications (12/27/2020):  This is the current hospital active medication list Current Facility-Administered Medications  Medication Dose Route Frequency Provider Last Rate Last Admin  . 0.9 %  sodium chloride infusion   Intravenous Continuous Eulogio Bear U, DO 50 mL/hr at 12/27/20 2202 Infusion Verify at 12/27/20 5427  . Chlorhexidine Gluconate Cloth 2 % PADS 6 each  6 each Topical Daily Lequita Halt, MD   6 each at 12/26/20 1000  . feeding supplement (OSMOLITE 1.2 CAL) liquid 1,000 mL  1,000 mL Per Tube Continuous Norm Parcel, PA-C      . fluconazole (DIFLUCAN) IVPB 200 mg  200 mg Intravenous Q24H Bertis Ruddy, RPH   Stopped at 12/26/20 1945  . free water 30 mL  30 mL Per Tube Q8H Dwan Bolt, MD   30 mL at 12/27/20 0509  . heparin injection 5,000 Units  5,000 Units Subcutaneous Q8H Lequita Halt, MD   5,000 Units at 12/27/20 0542  . HYDROmorphone (DILAUDID) injection 0.5-1 mg  0.5-1 mg Intravenous Q2H PRN Wynetta Fines T, MD   0.5 mg at 12/27/20 0945  . labetalol (NORMODYNE) injection 10 mg  10 mg  Intravenous Q4H PRN Wynetta Fines T, MD      . methocarbamol (ROBAXIN) 500 mg in dextrose 5 % 50 mL IVPB  500 mg Intravenous Q8H PRN Norm Parcel, PA-C      . metoprolol tartrate (LOPRESSOR) injection 2.5 mg  2.5 mg Intravenous Q6H Domenic Polite, MD      . ondansetron [facility]) tablet 4 mg  4 mg Oral Q6H PRN Lequita Halt, MD       Or  . ondansetron [facility]) injection 4 mg  4 mg Intravenous Q6H PRN Wynetta Fines T, MD      . pantoprazole (PROTONIX) injection 40 mg  40 mg Intravenous Q12H Barkley Boards R, PA-C      .  piperacillin-tazobactam (ZOSYN) IVPB 2.25 g  2.25 g Intravenous Q8H Pat Patrick, RPH   Stopped at 12/27/20 8466  . polyvinyl alcohol (LIQUIFILM TEARS) 1.4 % ophthalmic solution 1 drop  1 drop Both Eyes BID PRN Wynetta Fines T, MD      . rosuvastatin (CRESTOR) tablet 10 mg  10 mg Oral QPM Lequita Halt, MD         Discharge Medications: Please see discharge summary for a list of discharge medications.  Relevant Imaging Results:  Relevant Lab Results:   Additional Information SSN [place], [place]

## 2020-12-27 NOTE — Progress Notes (Signed)
Progress Note    [name]  GQQ:[id number] DOB: 1932-08-11  DOA: 12/25/2020 PCP: Jonathon Jordan, MD    Brief Narrative:     Medical records reviewed and are as summarized below:  [name] is an 87 y.o. female with medical history significant of HTN, HLD, colon CA status post partial colectomy, presented with worsening of abdominal pain. Found to have a perforated duodenal ulcer and underwent an exploratory laparotomy with graham patch repair of ulcer.  Assessment/Plan:   Acute peritonitis/pneumoperitoneum Duodenal perforation -Underwent ex lap with Phillip Heal patch repair, placement of jejunostomy and JP drain on 1/17 -Plan to start trickle tube feeds using JP drain per CCS -Continue gentle IV fluids today -Supportive care -PT OT -Continue empiric Zosyn and fluconazole, will discontinue vancomycin  AKI -improving with hydration  -continue gentle IVF today  Frequent PVCs -replete MG -check ECHo if persists  Uncontrolled hypertension - IV labetalol as needed for now  History of AAA -Somewhat enlargement of size of the AAA -outpatient vascular surgery for follow-up.  Incidental finding -CT abdomen pelvis noted liver and kidney lesions suspected to be benign, consider MRI down the road to reassess this   Family Communication/Anticipated D/C date and plan/Code Status   DVT prophylaxis: heparin Code Status: Full Code.  Family communication: Discussed with daughter Disposition Plan: Status is: Inpatient  Remains inpatient appropriate because:Inpatient level of care appropriate due to severity of illness   Dispo: The patient is from: Home              Anticipated d/c is to: Home              Anticipated d/c date is: > 3 days              Patient currently is not medically stable to d/c.    Consultants:    General surgery     Subjective:   -Feels okay, mild abdominal discomfort nausea, denies any dyspnea  Objective:     Vitals:   12/26/20 1933 12/26/20 2300 12/27/20 0505 12/27/20 0800  BP: (!) 160/64 138/62 (!) 143/61 (!) 153/71  Pulse: 82 74 61 70  Resp: 18 16 19 16   Temp: 98.6 F (37 C) 98 F (36.7 C) 97.8 F (36.6 C) 97.7 F (36.5 C)  TempSrc: Oral Oral Oral Oral  SpO2: 93% 92% 94% 94%  Weight:      Height:        Intake/Output Summary (Last 24 hours) at 12/27/2020 1025 Last data filed at 12/27/2020 6712 Gross per 24 hour  Intake 1877.86 ml  Output 875 ml  Net 1002.86 ml   Filed Weights   12/25/20 2022  Weight: 56 kg    Exam   Elderly frail female sitting up in bed, AAOx3 HEENT: No JVD CVS: S1-S2, regular rate rhythm Lungs: Decreased breath sounds at the bases Abdomen: Soft, mildly tender, J-tube and JP drain noted Extremities: No edema skin: No rashes on exposed skin   Data Reviewed:   I have personally reviewed following labs and imaging studies:  Labs: Labs show the following:   Basic Metabolic Panel: Recent Labs  Lab 12/25/20 1250 12/25/20 1255 12/25/20 2039 12/26/20 0040 12/27/20 0048  NA 137 139  --  139 136  K 4.5 4.6  --  5.1 4.0  CL 104 107  --  108 107  CO2 18*  --   --  20* 18*  GLUCOSE 159* 155*  --  141* 85  BUN 41*  42*  --  32* 29*  CREATININE 2.14* 2.00*  --  1.82* 1.73*  CALCIUM 8.2*  --   --  7.3* 7.2*  MG  --   --  1.7 1.7  --   PHOS  --   --  3.2  --   --    GFR Estimated Creatinine Clearance: 19.3 mL/min (A) (by C-G formula based on SCr of 1.73 mg/dL (H)). Liver Function Tests: Recent Labs  Lab 12/25/20 1250 12/26/20 0040 12/27/20 0048  AST 16 23 15   ALT 10 13 11   ALKPHOS 47 31* 33*  BILITOT 0.9 0.8 1.2  PROT 6.4* 5.6* 5.2*  ALBUMIN 3.6 3.3* 2.7*   No results for input(s): LIPASE, AMYLASE in the last 168 hours. No results for input(s): AMMONIA in the last 168 hours. Coagulation profile No results for input(s): INR, PROTIME in the last 168 hours.  CBC: Recent Labs  Lab 12/25/20 1250 12/25/20 1255 12/26/20 0040  12/27/20 0048  WBC 16.2*  --  16.9* 13.9*  NEUTROABS 14.3*  --   --   --   HGB 12.7 13.6 10.7* 9.5*  HCT 39.1 40.0 32.0* 29.7*  MCV 96.3  --  93.3 94.3  PLT 220  --  174 145*   Cardiac Enzymes: No results for input(s): CKTOTAL, CKMB, CKMBINDEX, TROPONINI in the last 168 hours. BNP (last 3 results) No results for input(s): PROBNP in the last 8760 hours. CBG: No results for input(s): GLUCAP in the last 168 hours. D-Dimer: No results for input(s): DDIMER in the last 72 hours. Hgb A1c: No results for input(s): HGBA1C in the last 72 hours. Lipid Profile: No results for input(s): CHOL, HDL, LDLCALC, TRIG, CHOLHDL, LDLDIRECT in the last 72 hours. Thyroid function studies: No results for input(s): TSH, T4TOTAL, T3FREE, THYROIDAB in the last 72 hours.  Invalid input(s): FREET3 Anemia work up: No results for input(s): VITAMINB12, FOLATE, FERRITIN, TIBC, IRON, RETICCTPCT in the last 72 hours. Sepsis Labs: Recent Labs  Lab 12/25/20 1240 12/25/20 1250 12/25/20 2039 12/26/20 0040 12/27/20 0048  WBC  --  16.2*  --  16.9* 13.9*  LATICACIDVEN 1.7  --  1.3  --   --     Microbiology Recent Results (from the past 240 hour(s))  SARS Coronavirus 2 by RT PCR (hospital order, performed in [place] hospital lab)     Status: None   Collection Time: 12/25/20  3:12 PM  Result Value Ref Range Status   SARS Coronavirus 2 NEGATIVE NEGATIVE Final    Comment: (NOTE) SARS-CoV-2 target nucleic acids are NOT DETECTED.  The SARS-CoV-2 RNA is generally detectable in upper and lower respiratory specimens during the acute phase of infection. The lowest concentration of SARS-CoV-2 viral copies this assay can detect is 250 copies / mL. A negative result does not preclude SARS-CoV-2 infection and should not be used as the sole basis for treatment or other patient management decisions.  A negative result may occur with improper specimen collection / handling, submission of specimen other than  nasopharyngeal swab, presence of viral mutation(s) within the areas targeted by this assay, and inadequate number of viral copies (<250 copies / mL). A negative result must be combined with clinical observations, patient history, and epidemiological information.  Fact Sheet for Patients:   StrictlyIdeas.no  Fact Sheet for Healthcare Providers: BankingDealers.co.za  This test is not yet approved or  cleared by the [place] FDA and has been authorized for detection and/or diagnosis of SARS-CoV-2 by FDA under an Emergency  Use Authorization (EUA).  This EUA will remain in effect (meaning this test can be used) for the duration of the COVID-19 declaration under Section 564(b)(1) of the Act, 21 U.S.C. section 360bbb-3(b)(1), unless the authorization is terminated or revoked sooner.  Performed at [facility], [place] [address], [place], [place] [address]     Procedures and diagnostic studies:  CT ABDOMEN PELVIS WO CONTRAST  Result Date: 12/25/2020 CLINICAL DATA:  Acute abdominal pain nonlocalized EXAM: CT ABDOMEN AND PELVIS WITHOUT CONTRAST TECHNIQUE: Multidetector CT imaging of the abdomen and pelvis was performed following the standard protocol without IV contrast. COMPARISON:  CT abdomen pelvis Apr 27, 2012. FINDINGS: Lower chest: Tiny right pleural effusion with bibasilar subsegmental atelectasis. Hepatobiliary: Slightly increased size of the hypoattenuating lesion in the left lobe of the liver which now measures 2.3 cm previously 1.7 cm (series 3 image 22). No significant change in size of the hypoattenuating lesion in the caudate lobe which measures 1.1 cm previously 1.0 cm (series 3 image 25). Increased size of a lesion in the posterior aspect of the right lobe of the liver which now measures 1.0 cm previously 0.4 cm (series 3, image 31). The gallbladder is unremarkable. No biliary ductal dilatation. Pancreas: There are few pancreatic  calcifications otherwise the pancreas is grossly unremarkable. Spleen: Normal in size without focal abnormality. Adrenals/Urinary Tract: Adrenal glands are unremarkable. Hypoattenuating exophytic 1.1 cm left interpolar renal lesion (series 3 image 27) and a hypoattenuating exophytic 1.5 cm left upper pole renal lesion (series 3, image 23). No left renal stones or hydronephrosis. The right kidney is normal, without renal calculi, focal lesion, or hydronephrosis. Bladder is unremarkable. Stomach/Bowel: Stomach is predominantly decompressed limiting evaluation. Though difficult to evaluate completely there appears to be low-density wall thickening with adjacent free fluid and gas along the proximal duodenum. The remainder of the small bowel appears unremarkable. Colonic diverticulosis. Without definite findings of diverticulitis. Small volume of formed stool in the colon. Anastomotic suture in the left hemipelvis. Vascular/Lymphatic: Extensive aortic and branch vessel atherosclerosis. There is infrarenal abdominal aortic aneurysm measuring up to 4.3 cm in maximum axial diameter with a previous maximum diameter of 2.9 cm in 2013. No visualized pathologically enlarged lymph nodes. Reproductive: Status post hysterectomy. No adnexal masses. Other: Pneumoperitoneum with small volume ascites predominant long the right pericolic gutter. Musculoskeletal: Multilevel degenerative changes spine. No acute osseous abnormality. IMPRESSION: 1. Pneumoperitoneum with small volume ascites predominant long the right pericolic gutter. Though difficult to evaluate completely there appears to be low-density wall thickening with adjacent free fluid and gas along the proximal duodenum. Findings are concerning for duodenal perforation. 2. Infrarenal abdominal aortic aneurysm measuring up to 4.3 cm in maximum axial diameter with a previous maximum diameter of 2.9 cm in 2013. Recommend follow-up every 12 months and vascular consultation. This  recommendation follows ACR consensus guidelines: White Paper of the ACR Incidental Findings Committee II on Vascular Findings. J Am Coll Radiol 2013; 10:789-794. 3. Slightly increased size of the hypoattenuating lesion in the left lobe of the liver which now measures 2.3 cm previously 1.7 cm. Increased size of a lesion in the posterior aspect of the right lobe of the liver which now measures 1.0 cm previously 0.4 cm in 2013. Findings are nonspecific, however the rather slow growth over 9 years would indicate a more benign etiology these would more completely evaluated with liver protocol MRI or CT with and without contrast. 4. Hypoattenuating exophytic left renal lesions measuring up to 1.5 cm, which  appear relatively homogeneous and favored to represent renal cysts. However these are incompletely evaluated on today's study and new from prior study. Further evaluation with non-emergent renal ultrasound is recommended, alternately these could be further evaluated on CT or MRI if utilized for evaluation hepatic lesions. 5. Tiny right pleural effusion with bibasilar subsegmental atelectasis. 6. Aortic atherosclerosis. Aortic Atherosclerosis (ICD10-I70.0). These results were called by telephone at the time of interpretation on 12/25/2020 at 2:25 pm to provider Dr [facility] , who verbally acknowledged these results. Electronically Signed   By: Dahlia Bailiff MD   On: 12/25/2020 14:47   DG Chest Portable 1 View  Result Date: 12/25/2020 CLINICAL DATA:  Abdominal pain since this morning EXAM: PORTABLE CHEST 1 VIEW COMPARISON:  Portable exam 1255 hours compared to 03/29/2015 FINDINGS: Normal heart size, mediastinal contours, and pulmonary vascularity. Atherosclerotic calcification of a tortuous thoracic aorta. Mild chronic accentuation of markings in the RIGHT upper lobe likely reflecting scarring. No acute infiltrate, pleural effusion, or pneumothorax. Bones demineralized. IMPRESSION: Mild RIGHT upper lobe scarring.  No acute abnormalities. Aortic Atherosclerosis (ICD10-I70.0). Electronically Signed   By: Lavonia Dana M.D.   On: 12/25/2020 13:05   DG Abd Portable 2 Views  Result Date: 12/25/2020 CLINICAL DATA:  Abdominal pain, history of abdominal aortic aneurysm EXAM: PORTABLE ABDOMEN - 2 VIEW COMPARISON:  06/24/2012 Aortic ultrasound 01/30/2018 FINDINGS: Lung bases clear. Nonobstructive bowel gas pattern. No bowel dilatation, bowel wall thickening, or free air. Bones demineralized with multilevel degenerative disc/facet disease changes of thoracolumbar spine and levoconvex scoliosis. Curvilinear LEFT paraspinal calcifications consistent with history of known abdominal aortic aneurysm, though size of aneurysm cannot be assessed by this exam. IMPRESSION: Normal bowel gas pattern. Curvilinear atherosclerotic calcifications at known abdominal aortic aneurysm, though size of aneurysm cannot be assessed radiographically. Aneurysm measured 3.3 cm diameter on an ultrasound exam from 2019 If there is clinical concern for complication of aortic aneurysm or acute enlargement, consider CT. Electronically Signed   By: Lavonia Dana M.D.   On: 12/25/2020 13:11    Medications:   . Chlorhexidine Gluconate Cloth  6 each Topical Daily  . free water  30 mL Per Tube Q8H  . heparin  5,000 Units Subcutaneous Q8H  . pantoprazole (PROTONIX) IV  40 mg Intravenous Q12H  . rosuvastatin  10 mg Oral QPM  . vancomycin variable dose per unstable renal function (pharmacist dosing)   Does not apply See admin instructions   Continuous Infusions: . sodium chloride 50 mL/hr at 12/27/20 0642  . acetaminophen Stopped (12/27/20 0525)  . feeding supplement (OSMOLITE 1.2 CAL)    . fluconazole (DIFLUCAN) IV Stopped (12/26/20 1945)  . magnesium sulfate bolus IVPB 2 g (12/27/20 0954)  . methocarbamol (ROBAXIN) IV    . piperacillin-tazobactam (ZOSYN)  IV Stopped (12/27/20 1497)  . vancomycin Stopped (12/27/20 0051)     LOS: 2 days   Domenic Polite  [facility]  12/27/2020, 10:25 AM

## 2020-12-27 NOTE — Progress Notes (Signed)
Spoke with Dr. Broadus John this am after pt had a 21-beat run of v-tach. Dr. Broadus John ordered magnesium; RN administered. Pt had another shorter subsequent run of v-tach and RN let Dr. Broadus John know. Dr. Broadus John ordered IV metoprolol; RN administered. RN is continuing to monitor.

## 2020-12-27 NOTE — Progress Notes (Signed)
Occupational Therapy Evaluation Patient Details Name: [name] MRN: [id number] DOB: Aug 01, 1932 Today's Date: 12/27/2020    History of Present Illness Patient is a 87 y/o female who presents with abdominal pain. Found to have perforated duodenal ulcer now s/p exp laparatomy, Graham patch repair and G-tube placement 12/26/2020. PMH includes HTN and colon CA s/p partial colectomy.   Clinical Impression   PTA pt lives alone and is "extremely independent". Pt able to mobilize to chair with min A with VSS. Pt will benefit from acute OT to maximize functional level of independence and facilitate DC to SNF for rehab. Will follow acutely.     Follow Up Recommendations  SNF;Supervision/Assistance - 24 hour    Equipment Recommendations  3 in 1 bedside commode    Recommendations for Other Services       Precautions / Restrictions Precautions Precautions: Other (comment);Fall Precaution Comments: JP drain Required Braces or Orthoses: Other Brace Other Brace: abdominal binder      Mobility Bed Mobility Overal bed mobility: Needs Assistance Bed Mobility: Supine to Sit Rolling: Mod assist Sidelying to sit: Mod assist            Transfers Overall transfer level: Needs assistance Equipment used: 1 person hand held assist Transfers: Sit to/from Stand;Stand Pivot Transfers Sit to Stand: Min assist Stand pivot transfers: Min assist            Balance Overall balance assessment: Needs assistance   Sitting balance-Leahy Scale: Fair       Standing balance-Leahy Scale: Poor                             ADL either performed or assessed with clinical judgement   ADL Overall ADL's : Needs assistance/impaired Eating/Feeding: NPO (ice chips only)   Grooming: Minimal assistance;Sitting   Upper Body Bathing: Minimal assistance;Sitting   Lower Body Bathing: Moderate assistance;Sit to/from stand   Upper Body Dressing : Minimal assistance   Lower Body  Dressing: Sit to/from stand;Maximal assistance   Toilet Transfer: Minimal assistance;Stand-pivot   Toileting- Clothing Manipulation and Hygiene: Maximal assistance       Functional mobility during ADLs: Minimal assistance       Vision         Perception     Praxis      Pertinent Vitals/Pain Pain Assessment: 0-10 Pain Score: 8  Pain Location: abdomen and L IV site Pain Descriptors / Indicators: Sore;Guarding;Grimacing;Moaning Pain Intervention(s): Limited activity within patient's tolerance;Other (comment) (nsg notified and attended to IV)     Hand Dominance Right   Extremity/Trunk Assessment Upper Extremity Assessment Upper Extremity Assessment: Generalized weakness   Lower Extremity Assessment Lower Extremity Assessment: Defer to PT evaluation   Cervical / Trunk Assessment Cervical / Trunk Assessment: Other exceptions (abdominal wound; drain)   Communication Communication Communication: HOH   Cognition Arousal/Alertness: Lethargic;Suspect due to medications Behavior During Therapy: Flat affect Overall Cognitive Status: Impaired/Different from baseline Area of Impairment: Attention;Safety/judgement;Awareness                   Current Attention Level: Selective Memory: Decreased short-term memory Following Commands: Follows one step commands with increased time Safety/Judgement: Decreased awareness of safety;Decreased awareness of deficits Awareness: Emergent Problem Solving: Slow processing General Comments: most likeley related to pain meds and pain   General Comments  encouraged general BU/LE exercise as tolerated while in bed/chair; asked nurse for incentive spirometer    Exercises  Shoulder Instructions      Home Living Family/patient expects to be discharged to:: Skilled nursing facility Living Arrangements: Alone Available Help at Discharge: Family;Available PRN/intermittently Type of Home: Other(Comment) Home Access: Level entry      Home Layout: One level     Bathroom Shower/Tub: Walk-in shower         Home Equipment: Shower seat - built in          Prior Functioning/Environment Level of Independence: Independent        Comments: daughter lives down the street. Helps with IADLs, groceries, pt drives.        OT Problem List: Decreased strength;Decreased activity tolerance;Impaired balance (sitting and/or standing);Decreased cognition;Decreased safety awareness;Decreased knowledge of use of DME or AE;Pain      OT Treatment/Interventions: Therapeutic exercise;Self-care/ADL training;Neuromuscular education;Energy conservation;DME and/or AE instruction;Therapeutic activities;Cognitive remediation/compensation;Patient/family education;Balance training    OT Goals(Current goals can be found in the care plan section) Acute Rehab OT Goals Patient Stated Goal: to get better OT Goal Formulation: With patient Time For Goal Achievement: 01/10/21 Potential to Achieve Goals: Good  OT Frequency: Min 2X/week   Barriers to D/C:            Co-evaluation              AM-PAC OT "6 Clicks" Daily Activity     Outcome Measure Help from another person eating meals?: A Lot Help from another person taking care of personal grooming?: A Little Help from another person toileting, which includes using toliet, bedpan, or urinal?: A Lot Help from another person bathing (including washing, rinsing, drying)?: A Lot Help from another person to put on and taking off regular upper body clothing?: A Little Help from another person to put on and taking off regular lower body clothing?: A Lot 6 Click Score: 14   End of Session Nurse Communication: Mobility status;Other (comment) (IV discomfort)  Activity Tolerance: Patient tolerated treatment well Patient left: in chair;with call bell/phone within reach;with chair alarm set  OT Visit Diagnosis: Unsteadiness on feet (R26.81);Other abnormalities of gait and mobility  (R26.89);Muscle weakness (generalized) (M62.81);Other symptoms and signs involving cognitive function;Pain Pain - Right/Left: Left Pain - part of body: Arm (abdomen)                Time: 1015-1040 OT Time Calculation (min): 25 min Charges:  OT General Charges $OT Visit: 1 Visit OT Evaluation $OT Eval Moderate Complexity: 1 Mod OT Treatments $Self Care/Home Management : 8-22 mins  Maurie Boettcher, OT/L   Acute OT Clinical Specialist [place] Pager [phone] Office [phone]   [facility] 12/27/2020, 10:56 AM

## 2020-12-27 NOTE — Progress Notes (Signed)
Initial Nutrition Assessment  RD working remotely.  DOCUMENTATION CODES:   Not applicable  INTERVENTION:   Initiate trickle tube feeds via J-tube: - Osmolite 1.2 @ 10 ml/hr (240 ml/day) - Recommend advancing as able to goal rate of Osmolite 1.2 @ 55 ml/hr (1320 ml/day) - Free water flushes per MD, currently 30 ml q 8 hours  Tube feeding regimen at goal provides 1584 kcal, 73 grams of protein, and 1082 ml of H2O.  NUTRITION DIAGNOSIS:   Inadequate oral intake related to altered GI function as evidenced by NPO status.  GOAL:   Patient will meet greater than or equal to 90% of their needs  MONITOR:   Diet advancement,Labs,Weight trends,TF tolerance,Skin,I & O's  REASON FOR ASSESSMENT:   Consult Enteral/tube feeding initiation and management (trickle tube feeds at 10 ml/hr via J-tube)  ASSESSMENT:   87 year old female who presented to the ED on 1/17 with abdominal pain. PMH of HTN, HLD, remote history of colon cancer s/p resection and colostomy takedown, chronic pain, PVD, AAA, osteoporosis. Pt admitted with pneumoperitoneum with possible duodenal perforation.   1/17 - s/p ex-lap, graham patch repair of duodenal ulcer, placement of feeding J-tube  RD consulted to initiate trickle tube feeds of 10 ml/hr via J-tube. Free water flushes of 30 ml q 8 hours current ordered.  Spoke with pt briefly via phone call to room. Pt unable to hear RD clearly so phone call was kept short. Pt reports pain is slowly improving. RD discussed plan to initiate tube feeds today.  Unable to obtain diet and weight history from pt at this time. Reviewed weight history in chart. Last weight available PTA is from September 2018 (72.5 kg). Current weight is 56 kg. Unsure if weight loss was gradual since 2018 or more acute. Unable to diagnose malnutrition at this time without NFPE.  RD will monitor for ability to advance tube feeds to goal rate.  Medications reviewed and include: IV protonix, IV abx, IV  diflucan  Labs reviewed: BUN 29, creatinine 1.73, hemoglobin 9.5  UOP: 1000 ml x 24 hours Right abdominal JP drain: 25 ml x 24 hours I/O's: +897 ml since admit  NUTRITION - FOCUSED PHYSICAL EXAM:  Unable to complete at this time. RD working remotely.  Diet Order:   Diet Order            Diet NPO time specified  Diet effective now                 EDUCATION NEEDS:   No education needs have been identified at this time  Skin:  Skin Assessment: Skin Integrity Issues: Incisions: abdomen  Last BM:  no documented BM  Height:   Ht Readings from Last 1 Encounters:  12/25/20 5\' 3"  (1.6 m)    Weight:   Wt Readings from Last 1 Encounters:  12/25/20 56 kg    BMI:  Body mass index is 21.87 kg/m.  Estimated Nutritional Needs:   Kcal:  1500-1700  Protein:  70-85 grams  Fluid:  1.5-1.7 L    Gustavus Bryant, MS, RD, LDN Inpatient Clinical Dietitian Please see AMiON for contact information.

## 2020-12-27 NOTE — Progress Notes (Signed)
Progress Note  2 Days Post-Op  Subjective: Patient reports abdominal pain is not too bad. She reports passing a little flatus. Feels weak.   Objective: Vital signs in last 24 hours: Temp:  [97.7 F (36.5 C)-98.8 F (37.1 C)] 97.7 F (36.5 C) (01/19 0800) Pulse Rate:  [61-83] 70 (01/19 0800) Resp:  [15-19] 16 (01/19 0800) BP: (128-160)/(54-76) 153/71 (01/19 0800) SpO2:  [92 %-94 %] 94 % (01/19 0800)    Intake/Output from previous day: 01/18 0701 - 01/19 0700 In: 1877.9 [I.V.:1159.8; IV Piggyback:658.1] Out: 1025 [Urine:1000; Drains:25] Intake/Output this shift: No intake/output data recorded.  PE: General: pleasant, WD, thin female who is laying in bed in NAD HEENT: head is normocephalic, atraumatic.  Sclera are anicteric.  PERRL.  Ears and nose without any masses or lesions.  Mouth is pink and moist Heart: regular, rate, and rhythm.   Lungs: CTAB, no wheezes, rhonchi, or rales noted.  Respiratory effort nonlabored Abd: soft, appropriately ttp, ND, +BS, drain in RLQ with bloody fluid, J tube clamped, midline incision c/d/i with surgical dressing present  MS: all 4 extremities are symmetrical with no cyanosis, clubbing, or edema. Skin: warm and dry with no masses, lesions, or rashes Neuro: Cranial nerves 2-12 grossly intact, sensation is normal throughout Psych: A&Ox3 with an appropriate affect.  Lab Results:  Recent Labs    12/26/20 0040 12/27/20 0048  WBC 16.9* 13.9*  HGB 10.7* 9.5*  HCT 32.0* 29.7*  PLT 174 145*   BMET Recent Labs    12/26/20 0040 12/27/20 0048  NA 139 136  K 5.1 4.0  CL 108 107  CO2 20* 18*  GLUCOSE 141* 85  BUN 32* 29*  CREATININE 1.82* 1.73*  CALCIUM 7.3* 7.2*   PT/INR No results for input(s): LABPROT, INR in the last 72 hours. CMP     Component Value Date/Time   NA 136 12/27/2020 0048   K 4.0 12/27/2020 0048   CL 107 12/27/2020 0048   CO2 18 (L) 12/27/2020 0048   GLUCOSE 85 12/27/2020 0048   BUN 29 (H) 12/27/2020 0048    CREATININE 1.73 (H) 12/27/2020 0048   CALCIUM 7.2 (L) 12/27/2020 0048   PROT 5.2 (L) 12/27/2020 0048   ALBUMIN 2.7 (L) 12/27/2020 0048   AST 15 12/27/2020 0048   ALT 11 12/27/2020 0048   ALKPHOS 33 (L) 12/27/2020 0048   BILITOT 1.2 12/27/2020 0048   GFRNONAA 28 (L) 12/27/2020 0048   GFRAA >90 08/01/2012 0436   Lipase  No results found for: LIPASE     Studies/Results: CT ABDOMEN PELVIS WO CONTRAST  Result Date: 12/25/2020 CLINICAL DATA:  Acute abdominal pain nonlocalized EXAM: CT ABDOMEN AND PELVIS WITHOUT CONTRAST TECHNIQUE: Multidetector CT imaging of the abdomen and pelvis was performed following the standard protocol without IV contrast. COMPARISON:  CT abdomen pelvis Apr 27, 2012. FINDINGS: Lower chest: Tiny right pleural effusion with bibasilar subsegmental atelectasis. Hepatobiliary: Slightly increased size of the hypoattenuating lesion in the left lobe of the liver which now measures 2.3 cm previously 1.7 cm (series 3 image 22). No significant change in size of the hypoattenuating lesion in the caudate lobe which measures 1.1 cm previously 1.0 cm (series 3 image 25). Increased size of a lesion in the posterior aspect of the right lobe of the liver which now measures 1.0 cm previously 0.4 cm (series 3, image 31). The gallbladder is unremarkable. No biliary ductal dilatation. Pancreas: There are few pancreatic calcifications otherwise the pancreas is grossly unremarkable. Spleen: Normal in  size without focal abnormality. Adrenals/Urinary Tract: Adrenal glands are unremarkable. Hypoattenuating exophytic 1.1 cm left interpolar renal lesion (series 3 image 27) and a hypoattenuating exophytic 1.5 cm left upper pole renal lesion (series 3, image 23). No left renal stones or hydronephrosis. The right kidney is normal, without renal calculi, focal lesion, or hydronephrosis. Bladder is unremarkable. Stomach/Bowel: Stomach is predominantly decompressed limiting evaluation. Though difficult to  evaluate completely there appears to be low-density wall thickening with adjacent free fluid and gas along the proximal duodenum. The remainder of the small bowel appears unremarkable. Colonic diverticulosis. Without definite findings of diverticulitis. Small volume of formed stool in the colon. Anastomotic suture in the left hemipelvis. Vascular/Lymphatic: Extensive aortic and branch vessel atherosclerosis. There is infrarenal abdominal aortic aneurysm measuring up to 4.3 cm in maximum axial diameter with a previous maximum diameter of 2.9 cm in 2013. No visualized pathologically enlarged lymph nodes. Reproductive: Status post hysterectomy. No adnexal masses. Other: Pneumoperitoneum with small volume ascites predominant long the right pericolic gutter. Musculoskeletal: Multilevel degenerative changes spine. No acute osseous abnormality. IMPRESSION: 1. Pneumoperitoneum with small volume ascites predominant long the right pericolic gutter. Though difficult to evaluate completely there appears to be low-density wall thickening with adjacent free fluid and gas along the proximal duodenum. Findings are concerning for duodenal perforation. 2. Infrarenal abdominal aortic aneurysm measuring up to 4.3 cm in maximum axial diameter with a previous maximum diameter of 2.9 cm in 2013. Recommend follow-up every 12 months and vascular consultation. This recommendation follows ACR consensus guidelines: White Paper of the ACR Incidental Findings Committee II on Vascular Findings. J Am Coll Radiol 2013; 10:789-794. 3. Slightly increased size of the hypoattenuating lesion in the left lobe of the liver which now measures 2.3 cm previously 1.7 cm. Increased size of a lesion in the posterior aspect of the right lobe of the liver which now measures 1.0 cm previously 0.4 cm in 2013. Findings are nonspecific, however the rather slow growth over 9 years would indicate a more benign etiology these would more completely evaluated with liver  protocol MRI or CT with and without contrast. 4. Hypoattenuating exophytic left renal lesions measuring up to 1.5 cm, which appear relatively homogeneous and favored to represent renal cysts. However these are incompletely evaluated on today's study and new from prior study. Further evaluation with non-emergent renal ultrasound is recommended, alternately these could be further evaluated on CT or MRI if utilized for evaluation hepatic lesions. 5. Tiny right pleural effusion with bibasilar subsegmental atelectasis. 6. Aortic atherosclerosis. Aortic Atherosclerosis (ICD10-I70.0). These results were called by telephone at the time of interpretation on 12/25/2020 at 2:25 pm to provider [facility] , who verbally acknowledged these results. Electronically Signed   By: Dahlia Bailiff MD   On: 12/25/2020 14:47   DG Chest Portable 1 View  Result Date: 12/25/2020 CLINICAL DATA:  Abdominal pain since this morning EXAM: PORTABLE CHEST 1 VIEW COMPARISON:  Portable exam 1255 hours compared to 03/29/2015 FINDINGS: Normal heart size, mediastinal contours, and pulmonary vascularity. Atherosclerotic calcification of a tortuous thoracic aorta. Mild chronic accentuation of markings in the RIGHT upper lobe likely reflecting scarring. No acute infiltrate, pleural effusion, or pneumothorax. Bones demineralized. IMPRESSION: Mild RIGHT upper lobe scarring. No acute abnormalities. Aortic Atherosclerosis (ICD10-I70.0). Electronically Signed   By: Lavonia Dana M.D.   On: 12/25/2020 13:05   DG Abd Portable 2 Views  Result Date: 12/25/2020 CLINICAL DATA:  Abdominal pain, history of abdominal aortic aneurysm EXAM: PORTABLE ABDOMEN - 2 VIEW COMPARISON:  06/24/2012 Aortic ultrasound 01/30/2018 FINDINGS: Lung bases clear. Nonobstructive bowel gas pattern. No bowel dilatation, bowel wall thickening, or free air. Bones demineralized with multilevel degenerative disc/facet disease changes of thoracolumbar spine and levoconvex scoliosis.  Curvilinear LEFT paraspinal calcifications consistent with history of known abdominal aortic aneurysm, though size of aneurysm cannot be assessed by this exam. IMPRESSION: Normal bowel gas pattern. Curvilinear atherosclerotic calcifications at known abdominal aortic aneurysm, though size of aneurysm cannot be assessed radiographically. Aneurysm measured 3.3 cm diameter on an ultrasound exam from 2019 If there is clinical concern for complication of aortic aneurysm or acute enlargement, consider CT. Electronically Signed   By: Lavonia Dana M.D.   On: 12/25/2020 13:11    Anti-infectives: Anti-infectives (From admission, onward)   Start     Dose/Rate Route Frequency Ordered Stop   12/26/20 2200  piperacillin-tazobactam (ZOSYN) IVPB 2.25 g        2.25 g 100 mL/hr over 30 Minutes Intravenous Every 8 hours 12/26/20 1530     12/26/20 1700  vancomycin (VANCOREADY) IVPB 500 mg/100 mL        500 mg 100 mL/hr over 60 Minutes Intravenous Every 24 hours 12/26/20 1129     12/25/20 2200  piperacillin-tazobactam (ZOSYN) IVPB 3.375 g  Status:  Discontinued        3.375 g 12.5 mL/hr over 240 Minutes Intravenous Every 8 hours 12/25/20 1517 12/26/20 1530   12/25/20 1530  vancomycin (VANCOREADY) IVPB 1250 mg/250 mL        1,250 mg 166.7 mL/hr over 90 Minutes Intravenous  Once 12/25/20 1517 12/25/20 1758   12/25/20 1530  fluconazole (DIFLUCAN) IVPB 200 mg        200 mg 100 mL/hr over 60 Minutes Intravenous Every 24 hours 12/25/20 1517     12/25/20 1516  vancomycin variable dose per unstable renal function (pharmacist dosing)         Does not apply See admin instructions 12/25/20 1517     12/25/20 1430  piperacillin-tazobactam (ZOSYN) IVPB 3.375 g        3.375 g 100 mL/hr over 30 Minutes Intravenous  Once 12/25/20 1427 12/25/20 1612       Assessment/Plan Hx of colon cancer - s/p resection and colostomy takedown HTN HLD Chronic pain AKI - Cr 1.73 from baseline of 0.69, improving but continue to  monitor Peripheral vascular disease AAA - infrarenal 4.3 cm- f/u per radiology guidelines Osteoporosis ABL anemia - hgb 9.5 from 12.7 on admit, continue to trend  Severe protein calorie malnutrition - prealbumin 9.1 today   Pneumoperitoneum with duodenal perforation  S/p exploratory laparotomy, Phillip Heal patch, placement of jejunostomy 12/25/20 Dr. Zenia Resides - POD#2 - strict NPO - limited ice chips to keep mouth moistened only, especially without NGT - JP with sanguinous fluid, continue for now - continue abx for total of 5 days - will plan UGI on POD#3-5 to evaluate for leak - start trickle J-tube feeds today - check H. pylori  FEN: NPO, IVF at 75 cc/h, start trickle Jtube feeds VTE: SQ heparin ID: Zosyn/vanc 1/17>> Foley: removed 1/18 Follow up: Dr. Zenia Resides  LOS: 2 days    Norm Parcel , [facility] 12/27/2020, 8:58 AM Please see Amion for pager number during day hours 7:00am-4:30pm

## 2020-12-27 NOTE — Progress Notes (Signed)
Physical Therapy Treatment Patient Details Name: [name] MRN: [id number] DOB: 24-Aug-1932 Today's Date: 12/27/2020    History of Present Illness Patient is a 87 y/o female who presents with abdominal pain. Found to have perforated duodenal ulcer now s/p exp laparatomy, Graham patch repair and G-tube placement 12/26/2020. PMH includes HTN and colon CA s/p partial colectomy.    PT Comments    Pt reports being up in chair about 2 hours this morning and increased abdominal pain now in supine. With encouragement pt reluctantly agrees to small bout of walking. Stating " I know I need to." Pt requires maxAx2 for bed mobility, and min Ax2 for transfers and ambulation of 16 feet with HHA. Pt limited by pain and increased LE weakness. Pt grateful to return to bed, RN in room at end of session to inquire about pain medication. D/c plans remain appropriate at this time. PT will continue to follow acutely.   Follow Up Recommendations  SNF     Equipment Recommendations  None recommended by PT       Precautions / Restrictions Precautions Precautions: Other (comment);Fall Precaution Comments: JP drain, PEG tube Required Braces or Orthoses: Other Brace Other Brace: abdominal binder Restrictions Weight Bearing Restrictions: No    Mobility  Bed Mobility Overal bed mobility: Needs Assistance Bed Mobility: Supine to Sit     Supine to sit: +2 for physical assistance;HOB elevated;Max assist     General bed mobility comments: pt in increased pain, so in attempt to decrease strain on abdomen, utilized bed pad and able to perform helicopter transfer to side of bed and provided posterior support to decrease trunk flexion at EoB  Transfers Overall transfer level: Needs assistance Equipment used: 2 person hand held assist Transfers: Sit to/from [facility] Sit to Stand: Min assist;+2 safety/equipment         General transfer comment: pt unable to tolerate sitting EoB due  to pain so quickly transferred to standing with minA x2  Ambulation/Gait Ambulation/Gait assistance: Min assist;+2 physical assistance Gait Distance (Feet): 16 Feet Assistive device: 2 person hand held assist Gait Pattern/deviations: Step-through pattern;Decreased step length - right;Decreased step length - left;Antalgic;Shuffle Gait velocity: slowed Gait velocity interpretation: <1.31 ft/sec, indicative of household ambulator General Gait Details: minAx2 for steadying and management of lines, pt able to walk at about 8 feet pt with bilateral knee buckling so returned to EoB and quickly performed helicopter transfer to bring her back to supine         Balance Overall balance assessment: Needs assistance Sitting-balance support: Feet supported;Bilateral upper extremity supported Sitting balance-Leahy Scale: Fair Sitting balance - Comments: requires modA from behind due to decreased trunk flexion     Standing balance-Leahy Scale: Poor                              Cognition Arousal/Alertness: Lethargic;Suspect due to medications Behavior During Therapy: Flat affect Overall Cognitive Status: Impaired/Different from baseline Area of Impairment: Attention;Safety/judgement;Awareness                   Current Attention Level: Selective Memory: Decreased short-term memory Following Commands: Follows one step commands with increased time Safety/Judgement: Decreased awareness of safety;Decreased awareness of deficits Awareness: Emergent Problem Solving: Slow processing General Comments: most likeley related to pain meds and pain         General Comments General comments (skin integrity, edema, etc.): VSS on RA, pt with increased pain,  RN in room to bring pain medication      Pertinent Vitals/Pain Pain Assessment: 0-10 Pain Score: 9  Pain Location: abdomen Pain Descriptors / Indicators: Sore;Guarding;Grimacing;Moaning Pain Intervention(s): Limited activity  within patient's tolerance;Monitored during session;Repositioned;Patient requesting pain meds-RN notified           PT Goals (current goals can now be found in the care plan section) Acute Rehab PT Goals Patient Stated Goal: to get better PT Goal Formulation: With patient Time For Goal Achievement: 01/09/21 Potential to Achieve Goals: Fair Progress towards PT goals: Progressing toward goals    Frequency    Min 3X/week      PT Plan Current plan remains appropriate       AM-PAC PT "6 Clicks" Mobility   Outcome Measure  Help needed turning from your back to your side while in a flat bed without using bedrails?: A Lot Help needed moving from lying on your back to sitting on the side of a flat bed without using bedrails?: Total Help needed moving to and from a bed to a chair (including a wheelchair)?: A Little Help needed standing up from a chair using your arms (e.g., wheelchair or bedside chair)?: A Little Help needed to walk in hospital room?: A Little Help needed climbing 3-5 steps with a railing? : A Lot 6 Click Score: 14    End of Session Equipment Utilized During Treatment: Other (comment) (abdominal binder) Activity Tolerance: Patient limited by pain Patient left: in bed;with call bell/phone within reach;with bed alarm set;with SCD's reapplied Nurse Communication: Mobility status;Patient requests pain meds PT Visit Diagnosis: Pain;Other abnormalities of gait and mobility (R26.89) Pain - part of body:  (abdomen)     Time: 8841-6606 PT Time Calculation (min) (ACUTE ONLY): 19 min  Charges:  $Gait Training: 8-22 mins                     Althea Backs B. Migdalia Dk PT, DPT Acute Rehabilitation Services Pager [phone] Office [phone]    [place] 12/27/2020, 4:04 PM

## 2020-12-27 NOTE — Plan of Care (Signed)
  Problem: Clinical Measurements: Goal: Ability to maintain clinical measurements within normal limits will improve Outcome: Progressing Goal: Postoperative complications will be avoided or minimized Outcome: Progressing   Problem: Skin Integrity: Goal: Demonstration of wound healing without infection will improve Outcome: Progressing   

## 2020-12-27 NOTE — Progress Notes (Signed)
Pt had 21-beat run of VT and another short run of VT subsequently. RN spoke with Dr. Broadus John. Dr. Broadus John is ordering magnesium for pt. RN will continue monitoring pt.

## 2020-12-28 DIAGNOSIS — K265 Chronic or unspecified duodenal ulcer with perforation: Secondary | ICD-10-CM | POA: Diagnosis not present

## 2020-12-28 DIAGNOSIS — K659 Peritonitis, unspecified: Secondary | ICD-10-CM | POA: Diagnosis not present

## 2020-12-28 DIAGNOSIS — N179 Acute kidney failure, unspecified: Secondary | ICD-10-CM | POA: Diagnosis not present

## 2020-12-28 LAB — CBC
HCT: 30.1 % — ABNORMAL LOW (ref 36.0–46.0)
Hemoglobin: 9.5 g/dL — ABNORMAL LOW (ref 12.0–15.0)
MCH: 29.6 pg (ref 26.0–34.0)
MCHC: 31.6 g/dL (ref 30.0–36.0)
MCV: 93.8 fL (ref 80.0–100.0)
Platelets: 166 10*3/uL (ref 150–400)
RBC: 3.21 MIL/uL — ABNORMAL LOW (ref 3.87–5.11)
RDW: 12.9 % (ref 11.5–15.5)
WBC: 12 10*3/uL — ABNORMAL HIGH (ref 4.0–10.5)
nRBC: 0 % (ref 0.0–0.2)

## 2020-12-28 LAB — BASIC METABOLIC PANEL
Anion gap: 10 (ref 5–15)
BUN: 27 mg/dL — ABNORMAL HIGH (ref 8–23)
CO2: 20 mmol/L — ABNORMAL LOW (ref 22–32)
Calcium: 7.6 mg/dL — ABNORMAL LOW (ref 8.9–10.3)
Chloride: 108 mmol/L (ref 98–111)
Creatinine, Ser: 1.48 mg/dL — ABNORMAL HIGH (ref 0.44–1.00)
GFR, Estimated: 34 mL/min — ABNORMAL LOW (ref >60)
Glucose, Bld: 95 mg/dL (ref 70–99)
Potassium: 3.8 mmol/L (ref 3.5–5.1)
Sodium: 138 mmol/L (ref 135–145)

## 2020-12-28 LAB — H. PYLORI ANTIBODY, IGG: H Pylori IgG: 0.31 Index Value (ref 0.00–0.79)

## 2020-12-28 MED ORDER — PIPERACILLIN-TAZOBACTAM 3.375 G IVPB
3.3750 g | Freq: Three times a day (TID) | INTRAVENOUS | Status: AC
  Administered 2020-12-28 – 2020-12-31 (×8): 3.375 g via INTRAVENOUS
  Filled 2020-12-28 (×8): qty 50

## 2020-12-28 MED ORDER — OSMOLITE 1.2 CAL PO LIQD
1000.0000 mL | ORAL | Status: DC
  Administered 2020-12-28: 1000 mL
  Filled 2020-12-28: qty 1000

## 2020-12-28 NOTE — TOC Progression Note (Signed)
Transition of Care [facility]) - Progression Note    Patient Details  Name: [name] MRN: [id number] Date of Birth: 06/24/1932  Transition of Care [facility]) CM/SW [place], [place] Phone Number: 12/28/2020, 3:09 PM  Clinical Narrative:     CSW called pt's daughter to discuss SNF options. Daughter chooses [facility].  CSW called Amado Coe with [facility]; Amado Coe explained that she can accept pt pending bed availability. Currently has no beds but stated to check when pt is medically ready.   Expected Discharge Plan: [place] Barriers to Discharge: Continued Medical Work up  Expected Discharge Plan and Services Expected Discharge Plan: [place] arrangements for the past 2 months: Apartment                                       Social Determinants of Health (SDOH) Interventions    Readmission Risk Interventions No flowsheet data found.

## 2020-12-28 NOTE — Progress Notes (Signed)
Progress Note  3 Days Post-Op  Subjective: Patient reports she had some increased pain overnight but is feeling better this AM. Denies nausea or vomiting. +flatus.   Objective: Vital signs in last 24 hours: Temp:  [98 F (36.7 C)-98.5 F (36.9 C)] 98 F (36.7 C) (01/20 0400) Pulse Rate:  [71-76] 76 (01/20 0400) Resp:  [13-17] 17 (01/20 0400) BP: (101-164)/(73-84) 101/84 (01/20 0400) SpO2:  [94 %-95 %] 95 % (01/20 0400)    Intake/Output from previous day: 01/19 0701 - 01/20 0700 In: 729.1 [P.O.:480; I.V.:49.1; IV Piggyback:200] Out: 730 [Urine:725; Drains:5] Intake/Output this shift: No intake/output data recorded.  PE: General: pleasant, WD,thinfemale who is laying in bed in NAD HEENT: head is normocephalic, atraumatic. Sclera are anicteric. PERRL. Ears and nose without any masses or lesions. Mouth is pink and moist Heart: regular, rate, and rhythm.  Lungs: CTAB, no wheezes, rhonchi, or rales noted. Respiratory effort nonlabored Abd: soft,appropriately ttp, ND, +BS,drain in RLQ with SS fluid, Jtube feeds running at 10 cc/h, midline incision c/d/i with surgical dressing present MS: all 4 extremities are symmetrical with no cyanosis, clubbing, or edema. Skin: warm and dry with no masses, lesions, or rashes Neuro: Cranial nerves 2-12 grossly intact, sensation is normal throughout Psych: A&Ox3 with an appropriate affect.   Lab Results:  Recent Labs    12/27/20 0048 12/28/20 0035  WBC 13.9* 12.0*  HGB 9.5* 9.5*  HCT 29.7* 30.1*  PLT 145* 166   BMET Recent Labs    12/27/20 0048 12/28/20 0035  NA 136 138  K 4.0 3.8  CL 107 108  CO2 18* 20*  GLUCOSE 85 95  BUN 29* 27*  CREATININE 1.73* 1.48*  CALCIUM 7.2* 7.6*   PT/INR No results for input(s): LABPROT, INR in the last 72 hours. CMP     Component Value Date/Time   NA 138 12/28/2020 0035   K 3.8 12/28/2020 0035   CL 108 12/28/2020 0035   CO2 20 (L) 12/28/2020 0035   GLUCOSE 95 12/28/2020  0035   BUN 27 (H) 12/28/2020 0035   CREATININE 1.48 (H) 12/28/2020 0035   CALCIUM 7.6 (L) 12/28/2020 0035   PROT 5.2 (L) 12/27/2020 0048   ALBUMIN 2.7 (L) 12/27/2020 0048   AST 15 12/27/2020 0048   ALT 11 12/27/2020 0048   ALKPHOS 33 (L) 12/27/2020 0048   BILITOT 1.2 12/27/2020 0048   GFRNONAA 34 (L) 12/28/2020 0035   GFRAA >90 08/01/2012 0436   Lipase  No results found for: LIPASE     Studies/Results: No results found.  Anti-infectives: Anti-infectives (From admission, onward)   Start     Dose/Rate Route Frequency Ordered Stop   12/28/20 1400  piperacillin-tazobactam (ZOSYN) IVPB 3.375 g        3.375 g 12.5 mL/hr over 240 Minutes Intravenous Every 8 hours 12/28/20 0652     12/26/20 2200  piperacillin-tazobactam (ZOSYN) IVPB 2.25 g  Status:  Discontinued        2.25 g 100 mL/hr over 30 Minutes Intravenous Every 8 hours 12/26/20 1530 12/28/20 0652   12/26/20 1700  vancomycin (VANCOREADY) IVPB 500 mg/100 mL  Status:  Discontinued        500 mg 100 mL/hr over 60 Minutes Intravenous Every 24 hours 12/26/20 1129 12/27/20 1038   12/25/20 2200  piperacillin-tazobactam (ZOSYN) IVPB 3.375 g  Status:  Discontinued        3.375 g 12.5 mL/hr over 240 Minutes Intravenous Every 8 hours 12/25/20 1517 12/26/20 1530   12/25/20  1530  vancomycin (VANCOREADY) IVPB 1250 mg/250 mL        1,250 mg 166.7 mL/hr over 90 Minutes Intravenous  Once 12/25/20 1517 12/25/20 1758   12/25/20 1530  fluconazole (DIFLUCAN) IVPB 200 mg        200 mg 100 mL/hr over 60 Minutes Intravenous Every 24 hours 12/25/20 1517     12/25/20 1516  vancomycin variable dose per unstable renal function (pharmacist dosing)  Status:  Discontinued         Does not apply See admin instructions 12/25/20 1517 12/27/20 1038   12/25/20 1430  piperacillin-tazobactam (ZOSYN) IVPB 3.375 g        3.375 g 100 mL/hr over 30 Minutes Intravenous  Once 12/25/20 1427 12/25/20 1612       Assessment/Plan Hx of colon cancer - s/p  resection and colostomy takedown HTN HLD Chronic pain AKI - Cr1.49from baseline of 0.69, improving but continue to monitor Peripheral vascular disease AAA - infrarenal 4.3 cm- f/u per radiology guidelines Osteoporosis ABL anemia - hgb 9.5, stable Severe protein calorie malnutrition - prealbumin 9.1 today   Pneumoperitoneum with duodenal perforation S/p exploratory laparotomy, Phillip Heal patch, placement of jejunostomy 12/25/20 Dr. Zenia Resides - POD#3 - strict NPO - limited ice chips to keep mouth moistened only, especially without NGT - JP with sanguinous fluid, continue for now - continue abx for total of 5 days - will plan UGI tomorrow to evaluate for leak - increase trickle J-tube feeds today to 20 cc/h - H. Pylori pending  FEN: NPO, Jtube feeds VTE: SQ heparin ID: Zosyn/vanc 1/17>> Foley: removed 1/18 Follow up: Dr. Zenia Resides  LOS: 3 days    Norm Parcel , [facility] 12/28/2020, 8:31 AM Please see Amion for pager number during day hours 7:00am-4:30pm

## 2020-12-28 NOTE — Care Management Important Message (Signed)
Important Message  Patient Details  Name: [name] MRN: [id number] Date of Birth: [birth year]   Medicare Important Message Given:  Yes     Stephaniemarie Stoffel [address] 12/28/2020, 2:44 PM

## 2020-12-28 NOTE — Progress Notes (Signed)
Physical Therapy Treatment Patient Details Name: [name] MRN: [id number] DOB: 17-Jan-1932 Today's Date: 12/28/2020    History of Present Illness Patient is a 87 y/o female who presents with abdominal pain. Found to have perforated duodenal ulcer now s/p exp laparatomy, Graham patch repair and G-tube placement 12/26/2020. PMH includes HTN and colon CA s/p partial colectomy.    PT Comments    Pt agreeable to getting out of bed, but does not want to walk in hallway. With increased encouragement pt agreeable. Pt continues to be limited in safe mobility by pain, decreased knowledge of deficits and impulsivity with movement. Pt requires multimodal cues for safety with mobility. Pt is maxAx2 for bed mobility and min Ax2 for transfers and ambulation with RW. D/c plans remain appropriate at this time. PT will continue to follow acutely.  Follow Up Recommendations  SNF     Equipment Recommendations  None recommended by PT       Precautions / Restrictions Precautions Precautions: Other (comment);Fall Precaution Comments: JP drain, PEG tube Required Braces or Orthoses: Other Brace Other Brace: abdominal binder Restrictions Weight Bearing Restrictions: No    Mobility  Bed Mobility Overal bed mobility: Needs Assistance Bed Mobility: Supine to Sit     Supine to sit: +2 for physical assistance;HOB elevated;Max assist     General bed mobility comments: pt with LE out of bed before therapist could manage lines increasing pull on her abdomen, require maxA for scooting hips to EOB and bringing trunk to upright  Transfers Overall transfer level: Needs assistance Equipment used: Rolling walker (2 wheeled) Transfers: Sit to/from [facility] Sit to Stand: Min assist;+2 safety/equipment         General transfer comment: min A for power up to standing, does not follow repeated commands to place hands on bed to push off, requires physical assist, min A for steadying in  standing pt with increased posterior lean and bracing against EoB  Ambulation/Gait Ambulation/Gait assistance: Min assist;+2 physical assistance Gait Distance (Feet): 30 Feet Assistive device: Rolling walker (2 wheeled) Gait Pattern/deviations: Step-through pattern;Decreased step length - right;Decreased step length - left;Antalgic;Shuffle Gait velocity: slowed Gait velocity interpretation: <1.31 ft/sec, indicative of household ambulator General Gait Details: min Ax2 for steadying and close chair follow, when pt goes to sit does not respond to multimodal cues to back up to chair and short sits requiring assist to scoot back in chair       Balance Overall balance assessment: Needs assistance Sitting-balance support: Feet supported;Bilateral upper extremity supported Sitting balance-Leahy Scale: Fair Sitting balance - Comments: requires modA from behind due to decreased trunk flexion Postural control: Posterior lean   Standing balance-Leahy Scale: Poor                              Cognition Arousal/Alertness: Awake/alert Behavior During Therapy: Flat affect;Impulsive Overall Cognitive Status: Impaired/Different from baseline Area of Impairment: Attention;Safety/judgement;Awareness                   Current Attention Level: Selective Memory: Decreased short-term memory Following Commands: Follows one step commands with increased time;Follows multi-step commands inconsistently Safety/Judgement: Decreased awareness of safety;Decreased awareness of deficits Awareness: Emergent Problem Solving: Slow processing General Comments: pt impulsive today, moving when asked not to to be able to move lines and leads appropriately         General Comments General comments (skin integrity, edema, etc.): VSS on RA  Pertinent Vitals/Pain Pain Assessment: 0-10 Pain Score: 4  Pain Location: abdomen Pain Descriptors / Indicators: Sore;Guarding;Grimacing;Moaning Pain  Intervention(s): Limited activity within patient's tolerance;Monitored during session;Repositioned           PT Goals (current goals can now be found in the care plan section) Acute Rehab PT Goals Patient Stated Goal: to get better PT Goal Formulation: With patient Time For Goal Achievement: 01/09/21 Potential to Achieve Goals: Fair    Frequency    Min 3X/week      PT Plan Current plan remains appropriate       AM-PAC PT "6 Clicks" Mobility   Outcome Measure  Help needed turning from your back to your side while in a flat bed without using bedrails?: A Lot Help needed moving from lying on your back to sitting on the side of a flat bed without using bedrails?: Total Help needed moving to and from a bed to a chair (including a wheelchair)?: A Little Help needed standing up from a chair using your arms (e.g., wheelchair or bedside chair)?: A Little Help needed to walk in hospital room?: A Little Help needed climbing 3-5 steps with a railing? : A Lot 6 Click Score: 14    End of Session Equipment Utilized During Treatment: Other (comment) (abdominal binder) Activity Tolerance: Patient limited by pain Patient left: in bed;with call bell/phone within reach;with chair alarm set Nurse Communication: Mobility status;Patient requests pain meds PT Visit Diagnosis: Pain;Other abnormalities of gait and mobility (R26.89) Pain - part of body:  (abdomen)     Time: 4270-6237 PT Time Calculation (min) (ACUTE ONLY): 11 min  Charges:  $Gait Training: 8-22 mins                     Pacey Willadsen B. Migdalia Dk PT, DPT Acute Rehabilitation Services Pager [phone] Office [phone]    [place] 12/28/2020, 4:06 PM

## 2020-12-28 NOTE — Progress Notes (Signed)
Pharmacy Antibiotic Note  [name] is a 87 y.o. female admitted on 12/25/2020 with perforated duodenal ulcer s/p ex lap 1/17.  Pharmacy has been consulted for zosyn and fluconazole dosing. Cr has now continued to improve with CrCl >20. Surgery planning x5 days of ABX (would end 1/21)  Plan: Adjust Zosyn to 3.375g IV EI q8h Continue fluconazole 200mg  IV q24h  Height: 5\' 3"  (160 cm) Weight: 56 kg (123 lb 7.3 oz) IBW/kg (Calculated) : 52.4  Temp (24hrs), Avg:98.1 F (36.7 C), Min:97.7 F (36.5 C), Max:98.5 F (36.9 C)  Recent Labs  Lab 12/25/20 1240 12/25/20 1250 12/25/20 1255 12/25/20 2039 12/26/20 0040 12/27/20 0048 12/28/20 0035  WBC  --  16.2*  --   --  16.9* 13.9* 12.0*  CREATININE  --  2.14* 2.00*  --  1.82* 1.73* 1.48*  LATICACIDVEN 1.7  --   --  1.3  --   --   --     Estimated Creatinine Clearance: 22.6 mL/min (A) (by C-G formula based on SCr of 1.48 mg/dL (H)).    No Known Allergies  Antimicrobials this admission: Zosyn 1/17>> Vanc 1/17>>1/18 Fluc 1/17>>  Dose adjustments this admission: n/a  Microbiology results: n/a  Arrie Senate, PharmD, BCPS, [facility] Clinical Pharmacist [phone] Please check AMION for all [place] numbers 12/28/2020

## 2020-12-28 NOTE — Progress Notes (Signed)
Progress Note    [name]  CBJ:[id number] DOB: 1932/07/16  DOA: 12/25/2020 PCP: Jonathon Jordan, MD    Brief Narrative:     Medical records reviewed and are as summarized below:  [name] is an 87 y.o. female with medical history significant of HTN, HLD, colon CA status post partial colectomy, presented with severe abdominal pain. Found to have a perforated duodenal ulcer and underwent an exploratory laparotomy with graham patch repair with J-tube placement  Assessment/Plan:   Acute peritonitis/pneumoperitoneum Duodenal perforation -Underwent ex lap with Phillip Heal patch repair, placement of jejunostomy tube and JP drain on 1/17 -Started on tube feeds 1/19, minimal output via JP drain  -Discontinue IV fluids -Ambulate, PT OT -Remains on IV Zosyn and fluconazole-day 4 -Labs in a.m.  AKI -improving with hydration  -Now discontinue IV fluids  Frequent PVCs -Started on low-dose IV beta-blocker -Mag repleted  Uncontrolled hypertension - IV labetalol as needed for now  History of AAA -Somewhat enlargement of size of the AAA -outpatient vascular surgery for follow-up.  Incidental finding -CT abdomen pelvis noted liver and kidney lesions suspected to be benign, consider MRI down the road to reassess this   Family Communication/Anticipated D/C date and plan/Code Status   DVT prophylaxis: heparin Code Status: Full Code.  Family communication: No family at bedside, discussed with daughter yesterday Disposition Plan: Status is: Inpatient  Remains inpatient appropriate because:Inpatient level of care appropriate due to severity of illness   Dispo: The patient is from: Home              Anticipated d/c is to: Home              Anticipated d/c date is: > 3 days              Patient currently is not medically stable to d/c.    Consultants:    General surgery  Procedures: Exploratory laparotomy, Phillip Heal patch repair of perforated duodenal  ulcer, placement of feeding jejunostomy tube, Dr. Zenia Resides 1/17   Subjective:   -Feels okay overall, has mild discomfort  Objective:    Vitals:   12/27/20 1200 12/27/20 1916 12/28/20 0400 12/28/20 0906  BP: (!) 164/73 (!) 162/82 101/84 (!) 168/79  Pulse: 72 71 76 74  Resp: 17 13 17 18   Temp:  98.5 F (36.9 C) 98 F (36.7 C) 97.9 F (36.6 C)  TempSrc:  Oral Oral Oral  SpO2: 95% 94% 95% 93%  Weight:      Height:        Intake/Output Summary (Last 24 hours) at 12/28/2020 1048 Last data filed at 12/28/2020 0548 Gross per 24 hour  Intake 729.09 ml  Output 730 ml  Net -0.91 ml   Filed Weights   12/25/20 2022  Weight: 56 kg    Exam   Elderly frail female sitting up in bed, AAOx3, no distress HEENT: No JVD CVS: S1-S2, regular rate rhythm Lungs: Decreased breath sounds at the bases, otherwise clear Abdomen: Soft, abdominal binder noted, minimal tenderness, G-tube with tube feeds infusing and JP drain with minimal output Extremities: No edema  skin: No rashes on exposed skin   Data Reviewed:   I have personally reviewed following labs and imaging studies:  Labs: Labs show the following:   Basic Metabolic Panel: Recent Labs  Lab 12/25/20 1250 12/25/20 1255 12/25/20 2039 12/26/20 0040 12/27/20 0048 12/28/20 0035  NA 137 139  --  139 136 138  K 4.5 4.6  --  5.1 4.0 3.8  CL 104 107  --  108 107 108  CO2 18*  --   --  20* 18* 20*  GLUCOSE 159* 155*  --  141* 85 95  BUN 41* 42*  --  32* 29* 27*  CREATININE 2.14* 2.00*  --  1.82* 1.73* 1.48*  CALCIUM 8.2*  --   --  7.3* 7.2* 7.6*  MG  --   --  1.7 1.7  --   --   PHOS  --   --  3.2  --   --   --    GFR Estimated Creatinine Clearance: 22.6 mL/min (A) (by C-G formula based on SCr of 1.48 mg/dL (H)). Liver Function Tests: Recent Labs  Lab 12/25/20 1250 12/26/20 0040 12/27/20 0048  AST 16 23 15   ALT 10 13 11   ALKPHOS 47 31* 33*  BILITOT 0.9 0.8 1.2  PROT 6.4* 5.6* 5.2*  ALBUMIN 3.6 3.3* 2.7*   No results  for input(s): LIPASE, AMYLASE in the last 168 hours. No results for input(s): AMMONIA in the last 168 hours. Coagulation profile No results for input(s): INR, PROTIME in the last 168 hours.  CBC: Recent Labs  Lab 12/25/20 1250 12/25/20 1255 12/26/20 0040 12/27/20 0048 12/28/20 0035  WBC 16.2*  --  16.9* 13.9* 12.0*  NEUTROABS 14.3*  --   --   --   --   HGB 12.7 13.6 10.7* 9.5* 9.5*  HCT 39.1 40.0 32.0* 29.7* 30.1*  MCV 96.3  --  93.3 94.3 93.8  PLT 220  --  174 145* 166   Cardiac Enzymes: No results for input(s): CKTOTAL, CKMB, CKMBINDEX, TROPONINI in the last 168 hours. BNP (last 3 results) No results for input(s): PROBNP in the last 8760 hours. CBG: No results for input(s): GLUCAP in the last 168 hours. D-Dimer: No results for input(s): DDIMER in the last 72 hours. Hgb A1c: No results for input(s): HGBA1C in the last 72 hours. Lipid Profile: No results for input(s): CHOL, HDL, LDLCALC, TRIG, CHOLHDL, LDLDIRECT in the last 72 hours. Thyroid function studies: No results for input(s): TSH, T4TOTAL, T3FREE, THYROIDAB in the last 72 hours.  Invalid input(s): FREET3 Anemia work up: No results for input(s): VITAMINB12, FOLATE, FERRITIN, TIBC, IRON, RETICCTPCT in the last 72 hours. Sepsis Labs: Recent Labs  Lab 12/25/20 1240 12/25/20 1250 12/25/20 2039 12/26/20 0040 12/27/20 0048 12/28/20 0035  WBC  --  16.2*  --  16.9* 13.9* 12.0*  LATICACIDVEN 1.7  --  1.3  --   --   --     Microbiology Recent Results (from the past 240 hour(s))  SARS Coronavirus 2 by RT PCR (hospital order, performed in [place] hospital lab)     Status: None   Collection Time: 12/25/20  3:12 PM  Result Value Ref Range Status   SARS Coronavirus 2 NEGATIVE NEGATIVE Final    Comment: (NOTE) SARS-CoV-2 target nucleic acids are NOT DETECTED.  The SARS-CoV-2 RNA is generally detectable in upper and lower respiratory specimens during the acute phase of infection. The lowest concentration of  SARS-CoV-2 viral copies this assay can detect is 250 copies / mL. A negative result does not preclude SARS-CoV-2 infection and should not be used as the sole basis for treatment or other patient management decisions.  A negative result may occur with improper specimen collection / handling, submission of specimen other than nasopharyngeal swab, presence of viral mutation(s) within the areas targeted by this assay, and inadequate number of viral copies (<  250 copies / mL). A negative result must be combined with clinical observations, patient history, and epidemiological information.  Fact Sheet for Patients:   StrictlyIdeas.no  Fact Sheet for Healthcare Providers: BankingDealers.co.za  This test is not yet approved or  cleared by the [place] FDA and has been authorized for detection and/or diagnosis of SARS-CoV-2 by FDA under an Emergency Use Authorization (EUA).  This EUA will remain in effect (meaning this test can be used) for the duration of the COVID-19 declaration under Section 564(b)(1) of the Act, 21 U.S.C. section 360bbb-3(b)(1), unless the authorization is terminated or revoked sooner.  Performed at [facility], [place] [address], [place], [place] [address]     Procedures and diagnostic studies:  No results found.  Medications:   . Chlorhexidine Gluconate Cloth  6 each Topical Daily  . free water  30 mL Per Tube Q8H  . heparin  5,000 Units Subcutaneous Q8H  . metoprolol tartrate  2.5 mg Intravenous Q6H  . pantoprazole (PROTONIX) IV  40 mg Intravenous Q12H  . rosuvastatin  10 mg Oral QPM   Continuous Infusions: . feeding supplement (OSMOLITE 1.2 CAL) 1,000 mL (12/28/20 0905)  . fluconazole (DIFLUCAN) IV Stopped (12/27/20 1759)  . methocarbamol (ROBAXIN) IV    . piperacillin-tazobactam (ZOSYN)  IV       LOS: 3 days   Domenic Polite  [facility]  12/28/2020, 10:48 AM

## 2020-12-29 ENCOUNTER — Inpatient Hospital Stay (HOSPITAL_COMMUNITY): Payer: Medicare Other

## 2020-12-29 DIAGNOSIS — K659 Peritonitis, unspecified: Secondary | ICD-10-CM | POA: Diagnosis not present

## 2020-12-29 DIAGNOSIS — N179 Acute kidney failure, unspecified: Secondary | ICD-10-CM | POA: Diagnosis not present

## 2020-12-29 DIAGNOSIS — K265 Chronic or unspecified duodenal ulcer with perforation: Secondary | ICD-10-CM | POA: Diagnosis not present

## 2020-12-29 LAB — CBC
HCT: 32.8 % — ABNORMAL LOW (ref 36.0–46.0)
Hemoglobin: 10.8 g/dL — ABNORMAL LOW (ref 12.0–15.0)
MCH: 30.1 pg (ref 26.0–34.0)
MCHC: 32.9 g/dL (ref 30.0–36.0)
MCV: 91.4 fL (ref 80.0–100.0)
Platelets: 204 10*3/uL (ref 150–400)
RBC: 3.59 MIL/uL — ABNORMAL LOW (ref 3.87–5.11)
RDW: 12.7 % (ref 11.5–15.5)
WBC: 9.9 10*3/uL (ref 4.0–10.5)
nRBC: 0 % (ref 0.0–0.2)

## 2020-12-29 LAB — BASIC METABOLIC PANEL
Anion gap: 10 (ref 5–15)
BUN: 19 mg/dL (ref 8–23)
CO2: 23 mmol/L (ref 22–32)
Calcium: 8.2 mg/dL — ABNORMAL LOW (ref 8.9–10.3)
Chloride: 104 mmol/L (ref 98–111)
Creatinine, Ser: 1.14 mg/dL — ABNORMAL HIGH (ref 0.44–1.00)
GFR, Estimated: 47 mL/min — ABNORMAL LOW (ref >60)
Glucose, Bld: 130 mg/dL — ABNORMAL HIGH (ref 70–99)
Potassium: 3.8 mmol/L (ref 3.5–5.1)
Sodium: 137 mmol/L (ref 135–145)

## 2020-12-29 LAB — GLUCOSE, CAPILLARY
Glucose-Capillary: 132 mg/dL — ABNORMAL HIGH (ref 70–99)
Glucose-Capillary: 142 mg/dL — ABNORMAL HIGH (ref 70–99)

## 2020-12-29 MED ORDER — METHOCARBAMOL 500 MG PO TABS
500.0000 mg | ORAL_TABLET | Freq: Three times a day (TID) | ORAL | Status: DC
  Administered 2020-12-29 – 2020-12-31 (×9): 500 mg via ORAL
  Filled 2020-12-29 (×9): qty 1

## 2020-12-29 MED ORDER — HYDROCHLOROTHIAZIDE 12.5 MG PO CAPS
12.5000 mg | ORAL_CAPSULE | Freq: Every day | ORAL | Status: DC
  Administered 2020-12-29 – 2020-12-30 (×2): 12.5 mg via ORAL
  Filled 2020-12-29 (×2): qty 1

## 2020-12-29 MED ORDER — LISINOPRIL-HYDROCHLOROTHIAZIDE 20-12.5 MG PO TABS: 1.0000 | ORAL_TABLET | Freq: Two times a day (BID) | ORAL | Status: DC

## 2020-12-29 MED ORDER — OSMOLITE 1.2 CAL PO LIQD
1000.0000 mL | ORAL | Status: DC
  Administered 2020-12-29 – 2020-12-30 (×3): 1000 mL
  Filled 2020-12-29 (×3): qty 1000

## 2020-12-29 MED ORDER — OXYCODONE HCL 5 MG PO TABS: 5.0000 mg | ORAL_TABLET | ORAL | Status: DC | PRN

## 2020-12-29 MED ORDER — ACETAMINOPHEN 325 MG PO TABS: 650.0000 mg | ORAL_TABLET | Freq: Four times a day (QID) | ORAL | Status: DC | PRN

## 2020-12-29 MED ORDER — BOOST / RESOURCE BREEZE PO LIQD CUSTOM
1.0000 | Freq: Three times a day (TID) | ORAL | Status: DC
  Administered 2020-12-29 – 2021-01-01 (×6): 1 via ORAL

## 2020-12-29 MED ORDER — PROSOURCE PLUS PO LIQD
30.0000 mL | Freq: Three times a day (TID) | ORAL | Status: DC
  Administered 2020-12-29 – 2020-12-30 (×4): 30 mL via ORAL
  Filled 2020-12-29 (×7): qty 30

## 2020-12-29 MED ORDER — IOHEXOL 300 MG/ML  SOLN
150.0000 mL | Freq: Once | INTRAMUSCULAR | Status: AC | PRN
  Administered 2020-12-29: 50 mL via ORAL

## 2020-12-29 MED ORDER — LISINOPRIL 20 MG PO TABS
20.0000 mg | ORAL_TABLET | Freq: Every day | ORAL | Status: DC
  Administered 2020-12-29 – 2020-12-30 (×2): 20 mg via ORAL
  Filled 2020-12-29 (×2): qty 1

## 2020-12-29 NOTE — Progress Notes (Signed)
Patient has 16 beats run of VTach. Patient asymptomatic and resting well. BP taken. MD made aware. No mew orders received at this moment. Will continue to monitor the patient.

## 2020-12-29 NOTE — Progress Notes (Signed)
Nutrition Follow-up  RD working remotely.  DOCUMENTATION CODES:   Not applicable  INTERVENTION:   - Do not recommend discontinuing tube feeds until diet has been advanced past clear liquids and pt demonstrating adequate PO intake; of note, pt never reached goal rate while on tube feeds  - Boost Breeze po TID, each supplement provides 250 kcal and 9 grams of protein  - ProSource Plus 30 ml po TID, each supplement provides 100 kcal and 15 grams of protein  NUTRITION DIAGNOSIS:   Inadequate oral intake related to altered GI function as evidenced by other (clear liquid diet).  Ongoing  GOAL:   Patient will meet greater than or equal to 90% of their needs  Unmet at this time  MONITOR:   PO intake,Supplement acceptance,Diet advancement,Labs,Weight trends,Skin  REASON FOR ASSESSMENT:   Consult Enteral/tube feeding initiation and management (trickle tube feeds at 10 ml/hr via J-tube)  ASSESSMENT:   87 year old female who presented to the ED on 1/17 with abdominal pain. PMH of HTN, HLD, remote history of colon cancer s/p resection and colostomy takedown, chronic pain, PVD, AAA, osteoporosis. Pt admitted with pneumoperitoneum with possible duodenal perforation.  1/17 - s/p ex-lap, graham patch repair of duodenal ulcer, placement of feeding J-tube 1/19 - trickle tube feeds initiated at 10 ml/hr 1/20 - tube feeds advanced to 20 ml/hr 1/21 - UGI negative for leak, clear liquid diet initiated, tube feeds discontinued  Noted plan for SNF at discharge. Unable to reach pt via phone call to room.  Surgery has discontinued pt's tube feeds and advanced diet to clear liquids. RD does not recommend discontinuing tube feeds until diet has been advanced past clear liquids and pt demonstrating adequate PO intake. Of note, pt never reached goal tube feeding rate of 55 ml/hr (highest rate was 20 ml/hr). Pt is high risk for malnutrition and may already meet criteria. RD unable to confirm without  NFPE.  RD will order multiple oral nutrition supplements to aid pt in meeting kcal and protein needs via PO intake. However, this is unlikely given restrictive nature of clear liquid diet.  Medications reviewed and include: Boost Breeze TID, IV protonix, IV abx  Labs reviewed: creatinine 1.14  UOP: 1525 ml x 24 hours JP drain: 5 ml x 24 hours  Diet Order:   Diet Order            Diet clear liquid Room service appropriate? Yes; Fluid consistency: Thin  Diet effective now                 EDUCATION NEEDS:   No education needs have been identified at this time  Skin:  Skin Assessment: Skin Integrity Issues: Incisions: abdomen  Last BM:  12/28/20  Height:   Ht Readings from Last 1 Encounters:  12/25/20 5\' 3"  (1.6 m)    Weight:   Wt Readings from Last 1 Encounters:  12/25/20 56 kg    BMI:  Body mass index is 21.87 kg/m.  Estimated Nutritional Needs:   Kcal:  1500-1700  Protein:  70-85 grams  Fluid:  1.5-1.7 L    Gustavus Bryant, MS, RD, LDN Inpatient Clinical Dietitian Please see AMiON for contact information.

## 2020-12-29 NOTE — Plan of Care (Signed)
°  Problem: Clinical Measurements: Goal: Ability to maintain clinical measurements within normal limits will improve Outcome: Progressing   Problem: Skin Integrity: Goal: Demonstration of wound healing without infection will improve Outcome: Progressing   

## 2020-12-29 NOTE — Progress Notes (Addendum)
Progress Note    [name]  UMP:[id number] DOB: 30-Oct-1932  DOA: 12/25/2020 PCP: Jonathon Jordan, MD    Brief Narrative:     Medical records reviewed and are as summarized below:  [name] is an 87 y.o. female with medical history significant of HTN, HLD, colon CA status post partial colectomy, presented with severe abdominal pain. Found to have a perforated duodenal ulcer and underwent an exploratory laparotomy with graham patch repair with J-tube placement  Assessment/Plan:   Acute peritonitis/pneumoperitoneum Duodenal perforation -Underwent ex lap with Phillip Heal patch repair, placement of jejunostomy tube and JP drain on 1/17 -Started on tube feeds 1/19, minimal output via JP drain  -Off IV fluids -Upper GI series was negative for a leak, starting clear liquids today -Ambulate as tolerated, PT OT, H. pylori is negative -Labs in a.m.  AKI -improving with hydration  -discontinued IV fluids  Frequent PVCs -Started on low-dose IV beta-blocker -Mag repleted  Uncontrolled hypertension - IV labetalol as needed for now  Severe protein calorie malnutrition, prealbumin is 9.1 -Continue tube feeds, advance diet as tolerated  History of AAA -outpatient vascular surgery for follow-up.  Incidental finding -CT abdomen pelvis noted liver and kidney lesions suspected to be benign, consider MRI down the road to reassess this   Family Communication/Anticipated D/C date and plan/Code Status   DVT prophylaxis: heparin Code Status: Full Code.  Family communication: No family at bedside Disposition Plan: Status is: Inpatient  Remains inpatient appropriate because:Inpatient level of care appropriate due to severity of illness   Dispo: The patient is from: Home              Anticipated d/c is to: SNF              Anticipated d/c date is: > 3 days              Patient currently is not medically stable to d/c.    Consultants:    General  surgery  Procedures: Exploratory laparotomy, Phillip Heal patch repair of perforated duodenal ulcer, placement of feeding jejunostomy tube, Dr. Zenia Resides 1/17   Subjective:   -Still complains of being sore but overall improving  Objective:    Vitals:   12/29/20 0427 12/29/20 0743 12/29/20 0950 12/29/20 1101  BP: (!) 168/98 (!) 159/73 (!) 159/73 (!) 155/79  Pulse: 71 63  68  Resp: 17 18  19   Temp: 97.8 F (36.6 C) 97.9 F (36.6 C)  97.9 F (36.6 C)  TempSrc: Oral Oral  Oral  SpO2: 94% 95%  95%  Weight:      Height:        Intake/Output Summary (Last 24 hours) at 12/29/2020 1418 Last data filed at 12/29/2020 1200 Gross per 24 hour  Intake 722.94 ml  Output 930 ml  Net -207.06 ml   Filed Weights   12/25/20 2022  Weight: 56 kg    Exam   Elderly frail female sitting up in bed, AAOx3, nondistressed ENT: No JVD CVS: S1-S2, regular rate rhythm Lungs: Decreased breath sounds at both bases, otherwise clear Abdomen: Soft, abdominal binder noted, minimal tenderness, J-tube with tube feeds and JP drain with minimal output Extremities: No edema  skin: No rashes on exposed skin   Data Reviewed:   I have personally reviewed following labs and imaging studies:  Labs: Labs show the following:   Basic Metabolic Panel: Recent Labs  Lab 12/25/20 1250 12/25/20 1255 12/25/20 2039 12/26/20 0040 12/27/20 0048 12/28/20 0035 12/29/20 0044  NA 137 139  --  139 136 138 137  K 4.5 4.6  --  5.1 4.0 3.8 3.8  CL 104 107  --  108 107 108 104  CO2 18*  --   --  20* 18* 20* 23  GLUCOSE 159* 155*  --  141* 85 95 130*  BUN 41* 42*  --  32* 29* 27* 19  CREATININE 2.14* 2.00*  --  1.82* 1.73* 1.48* 1.14*  CALCIUM 8.2*  --   --  7.3* 7.2* 7.6* 8.2*  MG  --   --  1.7 1.7  --   --   --   PHOS  --   --  3.2  --   --   --   --    GFR Estimated Creatinine Clearance: 29.3 mL/min (A) (by C-G formula based on SCr of 1.14 mg/dL (H)). Liver Function Tests: Recent Labs  Lab 12/25/20 1250  12/26/20 0040 12/27/20 0048  AST 16 23 15   ALT 10 13 11   ALKPHOS 47 31* 33*  BILITOT 0.9 0.8 1.2  PROT 6.4* 5.6* 5.2*  ALBUMIN 3.6 3.3* 2.7*   No results for input(s): LIPASE, AMYLASE in the last 168 hours. No results for input(s): AMMONIA in the last 168 hours. Coagulation profile No results for input(s): INR, PROTIME in the last 168 hours.  CBC: Recent Labs  Lab 12/25/20 1250 12/25/20 1255 12/26/20 0040 12/27/20 0048 12/28/20 0035 12/29/20 0044  WBC 16.2*  --  16.9* 13.9* 12.0* 9.9  NEUTROABS 14.3*  --   --   --   --   --   HGB 12.7 13.6 10.7* 9.5* 9.5* 10.8*  HCT 39.1 40.0 32.0* 29.7* 30.1* 32.8*  MCV 96.3  --  93.3 94.3 93.8 91.4  PLT 220  --  174 145* 166 204   Cardiac Enzymes: No results for input(s): CKTOTAL, CKMB, CKMBINDEX, TROPONINI in the last 168 hours. BNP (last 3 results) No results for input(s): PROBNP in the last 8760 hours. CBG: No results for input(s): GLUCAP in the last 168 hours. D-Dimer: No results for input(s): DDIMER in the last 72 hours. Hgb A1c: No results for input(s): HGBA1C in the last 72 hours. Lipid Profile: No results for input(s): CHOL, HDL, LDLCALC, TRIG, CHOLHDL, LDLDIRECT in the last 72 hours. Thyroid function studies: No results for input(s): TSH, T4TOTAL, T3FREE, THYROIDAB in the last 72 hours.  Invalid input(s): FREET3 Anemia work up: No results for input(s): VITAMINB12, FOLATE, FERRITIN, TIBC, IRON, RETICCTPCT in the last 72 hours. Sepsis Labs: Recent Labs  Lab 12/25/20 1240 12/25/20 1250 12/25/20 2039 12/26/20 0040 12/27/20 0048 12/28/20 0035 12/29/20 0044  WBC  --    < >  --  16.9* 13.9* 12.0* 9.9  LATICACIDVEN 1.7  --  1.3  --   --   --   --    < > = values in this interval not displayed.    Microbiology Recent Results (from the past 240 hour(s))  SARS Coronavirus 2 by RT PCR (hospital order, performed in [facility] hospital lab)     Status: None   Collection Time: 12/25/20  3:12 PM  Result Value Ref Range  Status   SARS Coronavirus 2 NEGATIVE NEGATIVE Final    Comment: (NOTE) SARS-CoV-2 target nucleic acids are NOT DETECTED.  The SARS-CoV-2 RNA is generally detectable in upper and lower respiratory specimens during the acute phase of infection. The lowest concentration of SARS-CoV-2 viral copies this assay can detect is 250 copies / mL.  A negative result does not preclude SARS-CoV-2 infection and should not be used as the sole basis for treatment or other patient management decisions.  A negative result may occur with improper specimen collection / handling, submission of specimen other than nasopharyngeal swab, presence of viral mutation(s) within the areas targeted by this assay, and inadequate number of viral copies (<250 copies / mL). A negative result must be combined with clinical observations, patient history, and epidemiological information.  Fact Sheet for Patients:   BoilerBrush.com.cy  Fact Sheet for Healthcare Providers: https://pope.com/  This test is not yet approved or  cleared by the [place] FDA and has been authorized for detection and/or diagnosis of SARS-CoV-2 by FDA under an Emergency Use Authorization (EUA).  This EUA will remain in effect (meaning this test can be used) for the duration of the COVID-19 declaration under Section 564(b)(1) of the Act, 21 U.S.C. section 360bbb-3(b)(1), unless the authorization is terminated or revoked sooner.  Performed at [facility], [address] [place], [place] [address]     Procedures and diagnostic studies:  DG UGI W SINGLE CM (SOL OR THIN BA)  Result Date: 12/29/2020 CLINICAL DATA:  Status post Cheree Ditto patch repair of perforated ulcer in the duodenal bulb. Contrast 50 cc Omnipaque 300 EXAM: WATER SOLUBLE UPPER GI SERIES TECHNIQUE: Single-column upper GI series was performed using water soluble contrast. CONTRAST:  50 cc Omnipaque 300 COMPARISON:  CT scan  12/25/2020. FLUOROSCOPY TIME:  Fluoroscopy Time:  1 minutes and 30 seconds. Radiation Exposure Index (if provided by the fluoroscopic device): 67.2 mGy Number of Acquired Spot Images: FINDINGS: Pre-procedure KUB shows diffuse gaseous distention of small bowel and colon compatible with underlying component of ileus. Presumed jejunostomy tube over the left abdomen. JP drain identified over the right abdomen with the tip positioned in the region of the pylorus/duodenal bulb. Patient was placed RPO relative to the fluoro table and given sips of water soluble contrast material. Contrast flowed readily into the stomach with prompt gastric emptying into the duodenum. With additional sips of contrast, there is continued gastric emptying to the level of the transverse duodenum. Deformation of the duodenal bulb is consistent with the recent surgery. Patient was subsequently evaluated supine and LPO positions as well. Duodenal contrast material was visualized passing the ligament of Treitz into the proximal jejunum. At no time during the exam was contrast extravasation observed in the region of the duodenal bulb or elsewhere along the distal stomach/duodenum. No opacification of the adjacent surgical drain was evident. IMPRESSION: 1. Brisk/prompt gastric emptying with no evidence for contrast extravasation in the region of the pylorus/duodenal bulb. Postoperative changes are evident in the duodenal bulb. No findings to suggest postoperative leak on today's exam. 2. No gastric, duodenal or proximal jejunal distention. Electronically Signed   By: Kennith Center M.D.   On: 12/29/2020 08:45    Medications:   . (feeding supplement) PROSource Plus  30 mL Oral TID BM  . Chlorhexidine Gluconate Cloth  6 each Topical Daily  . feeding supplement  1 Container Oral TID BM  . heparin  5,000 Units Subcutaneous Q8H  . lisinopril  20 mg Oral Daily   And  . hydrochlorothiazide  12.5 mg Oral Daily  . methocarbamol  500 mg Oral TID   . pantoprazole (PROTONIX) IV  40 mg Intravenous Q12H  . rosuvastatin  10 mg Oral QPM   Continuous Infusions: . fluconazole (DIFLUCAN) IV 200 mg (12/28/20 1730)  . piperacillin-tazobactam (ZOSYN)  IV 3.375  g (12/29/20 0601)     LOS: 4 days   Domenic Polite  [facility]  12/29/2020, 2:18 PM

## 2020-12-29 NOTE — Progress Notes (Signed)
Progress Note  4 Days Post-Op  Subjective: Patient went for UGI this AM. Having bowel function. Abdominal pain mostly incisional and stable. Denies nausea. PT/OT recommending SNF and patient in agreement with this.   Objective: Vital signs in last 24 hours: Temp:  [97.6 F (36.4 C)-98.6 F (37 C)] 97.9 F (36.6 C) (01/21 0743) Pulse Rate:  [63-82] 63 (01/21 0743) Resp:  [15-20] 18 (01/21 0743) BP: (130-177)/(73-102) 159/73 (01/21 0743) SpO2:  [94 %-96 %] 95 % (01/21 0743) Last BM Date: 12/28/20  Intake/Output from previous day: 01/20 0701 - 01/21 0700 In: 542.9 [NG/GT:485.2; IV Piggyback:57.8] Out: 1530 [Urine:1525; Drains:5] Intake/Output this shift: No intake/output data recorded.  PE: General: pleasant, WD,thinfemale who is laying in bed in NAD HEENT: head is normocephalic, atraumatic. Sclera are anicteric. PERRL. Ears and nose without any masses or lesions. Mouth is pink and moist Heart: regular, rate, and rhythm.  Lungs: CTAB, no wheezes, rhonchi, or rales noted. Respiratory effort nonlabored Abd: soft,appropriately ttp, ND, +BS,drain in RLQ withSSfluid, Jtube feeds running at 10 cc/h, midline incision c/d/i with surgical dressing present MS: all 4 extremities are symmetrical with no cyanosis, clubbing, or edema.    Lab Results:  Recent Labs    12/28/20 0035 12/29/20 0044  WBC 12.0* 9.9  HGB 9.5* 10.8*  HCT 30.1* 32.8*  PLT 166 204   BMET Recent Labs    12/28/20 0035 12/29/20 0044  NA 138 137  K 3.8 3.8  CL 108 104  CO2 20* 23  GLUCOSE 95 130*  BUN 27* 19  CREATININE 1.48* 1.14*  CALCIUM 7.6* 8.2*   PT/INR No results for input(s): LABPROT, INR in the last 72 hours. CMP     Component Value Date/Time   NA 137 12/29/2020 0044   K 3.8 12/29/2020 0044   CL 104 12/29/2020 0044   CO2 23 12/29/2020 0044   GLUCOSE 130 (H) 12/29/2020 0044   BUN 19 12/29/2020 0044   CREATININE 1.14 (H) 12/29/2020 0044   CALCIUM 8.2 (L) 12/29/2020 0044    PROT 5.2 (L) 12/27/2020 0048   ALBUMIN 2.7 (L) 12/27/2020 0048   AST 15 12/27/2020 0048   ALT 11 12/27/2020 0048   ALKPHOS 33 (L) 12/27/2020 0048   BILITOT 1.2 12/27/2020 0048   GFRNONAA 47 (L) 12/29/2020 0044   GFRAA >90 08/01/2012 0436   Lipase  No results found for: LIPASE     Studies/Results: DG UGI W SINGLE CM (SOL OR THIN BA)  Result Date: 12/29/2020 CLINICAL DATA:  Status post Phillip Heal patch repair of perforated ulcer in the duodenal bulb. Contrast 50 cc Omnipaque 300 EXAM: WATER SOLUBLE UPPER GI SERIES TECHNIQUE: Single-column upper GI series was performed using water soluble contrast. CONTRAST:  50 cc Omnipaque 300 COMPARISON:  CT scan 12/25/2020. FLUOROSCOPY TIME:  Fluoroscopy Time:  1 minutes and 30 seconds. Radiation Exposure Index (if provided by the fluoroscopic device): 67.2 mGy Number of Acquired Spot Images: FINDINGS: Pre-procedure KUB shows diffuse gaseous distention of small bowel and colon compatible with underlying component of ileus. Presumed jejunostomy tube over the left abdomen. JP drain identified over the right abdomen with the tip positioned in the region of the pylorus/duodenal bulb. Patient was placed RPO relative to the fluoro table and given sips of water soluble contrast material. Contrast flowed readily into the stomach with prompt gastric emptying into the duodenum. With additional sips of contrast, there is continued gastric emptying to the level of the transverse duodenum. Deformation of the duodenal bulb is consistent  with the recent surgery. Patient was subsequently evaluated supine and LPO positions as well. Duodenal contrast material was visualized passing the ligament of Treitz into the proximal jejunum. At no time during the exam was contrast extravasation observed in the region of the duodenal bulb or elsewhere along the distal stomach/duodenum. No opacification of the adjacent surgical drain was evident. IMPRESSION: 1. Brisk/prompt gastric emptying with  no evidence for contrast extravasation in the region of the pylorus/duodenal bulb. Postoperative changes are evident in the duodenal bulb. No findings to suggest postoperative leak on today's exam. 2. No gastric, duodenal or proximal jejunal distention. Electronically Signed   By: Misty Stanley M.D.   On: 12/29/2020 08:45    Anti-infectives: Anti-infectives (From admission, onward)   Start     Dose/Rate Route Frequency Ordered Stop   12/28/20 1400  piperacillin-tazobactam (ZOSYN) IVPB 3.375 g        3.375 g 12.5 mL/hr over 240 Minutes Intravenous Every 8 hours 12/28/20 0652 12/31/20 0001   12/26/20 2200  piperacillin-tazobactam (ZOSYN) IVPB 2.25 g  Status:  Discontinued        2.25 g 100 mL/hr over 30 Minutes Intravenous Every 8 hours 12/26/20 1530 12/28/20 0652   12/26/20 1700  vancomycin (VANCOREADY) IVPB 500 mg/100 mL  Status:  Discontinued        500 mg 100 mL/hr over 60 Minutes Intravenous Every 24 hours 12/26/20 1129 12/27/20 1038   12/25/20 2200  piperacillin-tazobactam (ZOSYN) IVPB 3.375 g  Status:  Discontinued        3.375 g 12.5 mL/hr over 240 Minutes Intravenous Every 8 hours 12/25/20 1517 12/26/20 1530   12/25/20 1530  vancomycin (VANCOREADY) IVPB 1250 mg/250 mL        1,250 mg 166.7 mL/hr over 90 Minutes Intravenous  Once 12/25/20 1517 12/25/20 1758   12/25/20 1530  fluconazole (DIFLUCAN) IVPB 200 mg        200 mg 100 mL/hr over 60 Minutes Intravenous Every 24 hours 12/25/20 1517 12/31/20 0001   12/25/20 1516  vancomycin variable dose per unstable renal function (pharmacist dosing)  Status:  Discontinued         Does not apply See admin instructions 12/25/20 1517 12/27/20 1038   12/25/20 1430  piperacillin-tazobactam (ZOSYN) IVPB 3.375 g        3.375 g 100 mL/hr over 30 Minutes Intravenous  Once 12/25/20 1427 12/25/20 1612       Assessment/Plan Hx of colon cancer - s/p resection and colostomy takedown HTN HLD Chronic pain AKI - Cr1.56from baseline of 0.69,  improving but continue to monitor Peripheral vascular disease AAA - infrarenal 4.3 cm- f/u per radiology guidelines Osteoporosis ABL anemia - hgb10.8, stable Severe protein calorie malnutrition - prealbumin 9.1 today  Pneumoperitoneum with duodenal perforation S/p exploratory laparotomy, Phillip Heal patch, placement of jejunostomy 12/25/20 Dr. Zenia Resides - POD#4 - UGI negative for leak, will start CLD today, ok to stop Tube feeds and see how much patient is able to take in PO - JP withsanguinousfluid, continue for now - continue abx for total of 5 days - set end date to 1/23 at 0001 - H. Pylori negative  FEN: CLD, breeze VTE: SQ heparin ID: Zosyn/vanc 1/17>> set to stop after POD#5 Foley: removed 1/18 Follow up: Dr. Zenia Resides  LOS: 4 days    Norm Parcel , [facility] 12/29/2020, 9:29 AM Please see Amion for pager number during day hours 7:00am-4:30pm

## 2020-12-30 DIAGNOSIS — K659 Peritonitis, unspecified: Secondary | ICD-10-CM | POA: Diagnosis not present

## 2020-12-30 DIAGNOSIS — N179 Acute kidney failure, unspecified: Secondary | ICD-10-CM | POA: Diagnosis not present

## 2020-12-30 DIAGNOSIS — K631 Perforation of intestine (nontraumatic): Secondary | ICD-10-CM | POA: Diagnosis not present

## 2020-12-30 DIAGNOSIS — K265 Chronic or unspecified duodenal ulcer with perforation: Secondary | ICD-10-CM | POA: Diagnosis not present

## 2020-12-30 LAB — BASIC METABOLIC PANEL
Anion gap: 10 (ref 5–15)
BUN: 17 mg/dL (ref 8–23)
CO2: 23 mmol/L (ref 22–32)
Calcium: 8 mg/dL — ABNORMAL LOW (ref 8.9–10.3)
Chloride: 104 mmol/L (ref 98–111)
Creatinine, Ser: 1.08 mg/dL — ABNORMAL HIGH (ref 0.44–1.00)
GFR, Estimated: 50 mL/min — ABNORMAL LOW (ref >60)
Glucose, Bld: 156 mg/dL — ABNORMAL HIGH (ref 70–99)
Potassium: 3.3 mmol/L — ABNORMAL LOW (ref 3.5–5.1)
Sodium: 137 mmol/L (ref 135–145)

## 2020-12-30 LAB — GLUCOSE, CAPILLARY
Glucose-Capillary: 149 mg/dL — ABNORMAL HIGH (ref 70–99)
Glucose-Capillary: 130 mg/dL — ABNORMAL HIGH (ref 70–99)
Glucose-Capillary: 114 mg/dL — ABNORMAL HIGH (ref 70–99)
Glucose-Capillary: 123 mg/dL — ABNORMAL HIGH (ref 70–99)
Glucose-Capillary: 138 mg/dL — ABNORMAL HIGH (ref 70–99)

## 2020-12-30 LAB — TROPONIN I (HIGH SENSITIVITY)
Troponin I (High Sensitivity): 19 ng/L — ABNORMAL HIGH (ref <18)
Troponin I (High Sensitivity): 18 ng/L — ABNORMAL HIGH (ref <18)

## 2020-12-30 LAB — CBC
HCT: 30.2 % — ABNORMAL LOW (ref 36.0–46.0)
Hemoglobin: 10.1 g/dL — ABNORMAL LOW (ref 12.0–15.0)
MCH: 30.3 pg (ref 26.0–34.0)
MCHC: 33.4 g/dL (ref 30.0–36.0)
MCV: 90.7 fL (ref 80.0–100.0)
Platelets: 195 10*3/uL (ref 150–400)
RBC: 3.33 MIL/uL — ABNORMAL LOW (ref 3.87–5.11)
RDW: 12.7 % (ref 11.5–15.5)
WBC: 6.5 10*3/uL (ref 4.0–10.5)
nRBC: 0 % (ref 0.0–0.2)

## 2020-12-30 LAB — MAGNESIUM: Magnesium: 1.6 mg/dL — ABNORMAL LOW (ref 1.7–2.4)

## 2020-12-30 MED ORDER — POTASSIUM CHLORIDE CRYS ER 20 MEQ PO TBCR
40.0000 meq | EXTENDED_RELEASE_TABLET | Freq: Once | ORAL | Status: AC
  Administered 2020-12-30: 40 meq via ORAL
  Filled 2020-12-30: qty 2

## 2020-12-30 MED ORDER — POTASSIUM CHLORIDE CRYS ER 20 MEQ PO TBCR
40.0000 meq | EXTENDED_RELEASE_TABLET | Freq: Two times a day (BID) | ORAL | Status: AC
  Administered 2020-12-30: 40 meq via ORAL
  Filled 2020-12-30: qty 2

## 2020-12-30 MED ORDER — MAGNESIUM SULFATE 2 GM/50ML IV SOLN
2.0000 g | Freq: Once | INTRAVENOUS | Status: AC
  Administered 2020-12-30: 2 g via INTRAVENOUS
  Filled 2020-12-30: qty 50

## 2020-12-30 MED ORDER — AMLODIPINE BESYLATE 10 MG PO TABS
10.0000 mg | ORAL_TABLET | Freq: Every day | ORAL | Status: DC
  Administered 2020-12-30 – 2021-01-02 (×4): 10 mg via ORAL
  Filled 2020-12-30 (×4): qty 1

## 2020-12-30 NOTE — Plan of Care (Signed)
  Problem: Clinical Measurements: Goal: Ability to maintain clinical measurements within normal limits will improve Outcome: Progressing Goal: Postoperative complications will be avoided or minimized Outcome: Progressing   Problem: Skin Integrity: Goal: Demonstration of wound healing without infection will improve Outcome: Progressing   Problem: Education: Goal: Knowledge of General Education information will improve Description: Including pain rating scale, medication(s)/side effects and non-pharmacologic comfort measures Outcome: Progressing   Problem: Health Behavior/Discharge Planning: Goal: Ability to manage health-related needs will improve Outcome: Progressing   Problem: Clinical Measurements: Goal: Will remain free from infection Outcome: Progressing Goal: Diagnostic test results will improve Outcome: Progressing Goal: Cardiovascular complication will be avoided Outcome: Progressing   Problem: Coping: Goal: Level of anxiety will decrease Outcome: Progressing   Problem: Pain Managment: Goal: General experience of comfort will improve Outcome: Progressing

## 2020-12-30 NOTE — Plan of Care (Signed)

## 2020-12-30 NOTE — Progress Notes (Signed)
Patient had 48 run VT while transitioning from [facility] to bed.  States she feels dizzy and nauseated.  Patient appears to have a dazed look as though she is staring through you.  Feels some better after returning to bed.  BP 160/70. MD notified.  Verbal orders obtained for stat troponin x2, mag (transfuse 2mg  IV mag if level less than 2), also for 54meq K now and repeat in 2 hours.  Will continue to monitor.

## 2020-12-30 NOTE — Progress Notes (Signed)
Patient ID: [name], female   DOB: 01-Sep-1932, 87 y.o.   MRN: [id number] [facility] Surgery Progress Note:   5 Days Post-Op  Subjective: Mental status is slightly confused.  Complaints nothing specific. Objective: Vital signs in last 24 hours: Temp:  [97.7 F (36.5 C)-98.5 F (36.9 C)] 97.7 F (36.5 C) (01/22 0400) Pulse Rate:  [68-89] 89 (01/22 0400) Resp:  [19-20] 20 (01/22 0400) BP: (147-179)/(70-91) 164/91 (01/22 0400) SpO2:  [94 %-96 %] 95 % (01/22 0400) Weight:  [55.9 kg] 55.9 kg (01/22 0400)  Intake/Output from previous day: 01/21 0701 - 01/22 0700 In: 1421.3 [P.O.:300; NG/GT:762.2; IV Piggyback:359.1] Out: 157 [Urine:151; Drains:5; Stool:1] Intake/Output this shift: No intake/output data recorded.  Physical Exam: Work of breathing is not labored.  Feeding tube in place and receiving TF.  Incision with honeycomb dressing.    Lab Results:  Results for orders placed or performed during the hospital encounter of 12/25/20 (from the past 48 hour(s))  CBC     Status: Abnormal   Collection Time: 12/29/20 12:44 AM  Result Value Ref Range   WBC 9.9 4.0 - 10.5 K/uL   RBC 3.59 (L) 3.87 - 5.11 MIL/uL   Hemoglobin 10.8 (L) 12.0 - 15.0 g/dL   HCT 32.8 (L) 36.0 - 46.0 %   MCV 91.4 80.0 - 100.0 fL   MCH 30.1 26.0 - 34.0 pg   MCHC 32.9 30.0 - 36.0 g/dL   RDW 12.7 11.5 - 15.5 %   Platelets 204 150 - 400 K/uL   nRBC 0.0 0.0 - 0.2 %    Comment: Performed at [facility], [place] [address], [place], [place] [address]  Basic metabolic panel     Status: Abnormal   Collection Time: 12/29/20 12:44 AM  Result Value Ref Range   Sodium 137 135 - 145 mmol/L   Potassium 3.8 3.5 - 5.1 mmol/L   Chloride 104 98 - 111 mmol/L   CO2 23 22 - 32 mmol/L   Glucose, Bld 130 (H) 70 - 99 mg/dL    Comment: Glucose reference range applies only to samples taken after fasting for at least 8 hours.   BUN 19 8 - 23 mg/dL   Creatinine, Ser 1.14 (H) 0.44 - 1.00 mg/dL   Calcium 8.2 (L) 8.9  - 10.3 mg/dL   GFR, Estimated 47 (L) >60 mL/min    Comment: (NOTE) Calculated using the CKD-EPI Creatinine Equation (2021)    Anion gap 10 5 - 15    Comment: Performed at [facility] [address], [place], [place] [address]  Glucose, capillary     Status: Abnormal   Collection Time: 12/29/20  7:38 PM  Result Value Ref Range   Glucose-Capillary 132 (H) 70 - 99 mg/dL    Comment: Glucose reference range applies only to samples taken after fasting for at least 8 hours.  Glucose, capillary     Status: Abnormal   Collection Time: 12/29/20 11:24 PM  Result Value Ref Range   Glucose-Capillary 142 (H) 70 - 99 mg/dL    Comment: Glucose reference range applies only to samples taken after fasting for at least 8 hours.  CBC     Status: Abnormal   Collection Time: 12/30/20  1:33 AM  Result Value Ref Range   WBC 6.5 4.0 - 10.5 K/uL   RBC 3.33 (L) 3.87 - 5.11 MIL/uL   Hemoglobin 10.1 (L) 12.0 - 15.0 g/dL   HCT 30.2 (L) 36.0 - 46.0 %  MCV 90.7 80.0 - 100.0 fL   MCH 30.3 26.0 - 34.0 pg   MCHC 33.4 30.0 - 36.0 g/dL   RDW 12.7 11.5 - 15.5 %   Platelets 195 150 - 400 K/uL   nRBC 0.0 0.0 - 0.2 %    Comment: Performed at [facility], [place] [address] [place], [place] [id number]  Basic metabolic panel     Status: Abnormal   Collection Time: 12/30/20  1:33 AM  Result Value Ref Range   Sodium 137 135 - 145 mmol/L   Potassium 3.3 (L) 3.5 - 5.1 mmol/L   Chloride 104 98 - 111 mmol/L   CO2 23 22 - 32 mmol/L   Glucose, Bld 156 (H) 70 - 99 mg/dL    Comment: Glucose reference range applies only to samples taken after fasting for at least 8 hours.   BUN 17 8 - 23 mg/dL   Creatinine, Ser 1.08 (H) 0.44 - 1.00 mg/dL   Calcium 8.0 (L) 8.9 - 10.3 mg/dL   GFR, Estimated 50 (L) >60 mL/min    Comment: (NOTE) Calculated using the CKD-EPI Creatinine Equation (2021)    Anion gap 10 5 - 15    Comment: Performed at [facility] [address] [place], [place] [address]  Glucose, capillary      Status: Abnormal   Collection Time: 12/30/20  3:58 AM  Result Value Ref Range   Glucose-Capillary 149 (H) 70 - 99 mg/dL    Comment: Glucose reference range applies only to samples taken after fasting for at least 8 hours.  Magnesium     Status: Abnormal   Collection Time: 12/30/20  6:50 AM  Result Value Ref Range   Magnesium 1.6 (L) 1.7 - 2.4 mg/dL    Comment: Performed at [facility] [address] [place], [place] [address]  Troponin I (High Sensitivity)     Status: Abnormal   Collection Time: 12/30/20  6:50 AM  Result Value Ref Range   Troponin I (High Sensitivity) 19 (H) <18 ng/L    Comment: (NOTE) Elevated high sensitivity troponin I (hsTnI) values and significant  changes across serial measurements may suggest ACS but many other  chronic and acute conditions are known to elevate hsTnI results.  Refer to the "Links" section for chest pain algorithms and additional  guidance. Performed at [facility], [place] [address], [place], [place] [address]   Troponin I (High Sensitivity)     Status: Abnormal   Collection Time: 12/30/20  7:44 AM  Result Value Ref Range   Troponin I (High Sensitivity) 18 (H) <18 ng/L    Comment: (NOTE) Elevated high sensitivity troponin I (hsTnI) values and significant  changes across serial measurements may suggest ACS but many other  chronic and acute conditions are known to elevate hsTnI results.  Refer to the "Links" section for chest pain algorithms and additional  guidance. Performed at [facility], [place] [address], [place], [place] [address]   Glucose, capillary     Status: Abnormal   Collection Time: 12/30/20  8:19 AM  Result Value Ref Range   Glucose-Capillary 130 (H) 70 - 99 mg/dL    Comment: Glucose reference range applies only to samples taken after fasting for at least 8 hours.    Radiology/Results: DG UGI W SINGLE CM (SOL OR THIN BA)  Result Date: 12/29/2020 CLINICAL DATA:  Status post Phillip Heal patch repair of  perforated ulcer in the duodenal bulb. Contrast 50  cc Omnipaque 300 EXAM: WATER SOLUBLE UPPER GI SERIES TECHNIQUE: Single-column upper GI series was performed using water soluble contrast. CONTRAST:  50 cc Omnipaque 300 COMPARISON:  CT scan 12/25/2020. FLUOROSCOPY TIME:  Fluoroscopy Time:  1 minutes and 30 seconds. Radiation Exposure Index (if provided by the fluoroscopic device): 67.2 mGy Number of Acquired Spot Images: FINDINGS: Pre-procedure KUB shows diffuse gaseous distention of small bowel and colon compatible with underlying component of ileus. Presumed jejunostomy tube over the left abdomen. JP drain identified over the right abdomen with the tip positioned in the region of the pylorus/duodenal bulb. Patient was placed RPO relative to the fluoro table and given sips of water soluble contrast material. Contrast flowed readily into the stomach with prompt gastric emptying into the duodenum. With additional sips of contrast, there is continued gastric emptying to the level of the transverse duodenum. Deformation of the duodenal bulb is consistent with the recent surgery. Patient was subsequently evaluated supine and LPO positions as well. Duodenal contrast material was visualized passing the ligament of Treitz into the proximal jejunum. At no time during the exam was contrast extravasation observed in the region of the duodenal bulb or elsewhere along the distal stomach/duodenum. No opacification of the adjacent surgical drain was evident. IMPRESSION: 1. Brisk/prompt gastric emptying with no evidence for contrast extravasation in the region of the pylorus/duodenal bulb. Postoperative changes are evident in the duodenal bulb. No findings to suggest postoperative leak on today's exam. 2. No gastric, duodenal or proximal jejunal distention. Electronically Signed   By: Misty Stanley M.D.   On: 12/29/2020 08:45    Anti-infectives: Anti-infectives (From admission, onward)   Start     Dose/Rate Route Frequency  Ordered Stop   12/28/20 1400  piperacillin-tazobactam (ZOSYN) IVPB 3.375 g        3.375 g 12.5 mL/hr over 240 Minutes Intravenous Every 8 hours 12/28/20 0652 12/31/20 0959   12/26/20 2200  piperacillin-tazobactam (ZOSYN) IVPB 2.25 g  Status:  Discontinued        2.25 g 100 mL/hr over 30 Minutes Intravenous Every 8 hours 12/26/20 1530 12/28/20 0652   12/26/20 1700  vancomycin (VANCOREADY) IVPB 500 mg/100 mL  Status:  Discontinued        500 mg 100 mL/hr over 60 Minutes Intravenous Every 24 hours 12/26/20 1129 12/27/20 1038   12/25/20 2200  piperacillin-tazobactam (ZOSYN) IVPB 3.375 g  Status:  Discontinued        3.375 g 12.5 mL/hr over 240 Minutes Intravenous Every 8 hours 12/25/20 1517 12/26/20 1530   12/25/20 1530  vancomycin (VANCOREADY) IVPB 1250 mg/250 mL        1,250 mg 166.7 mL/hr over 90 Minutes Intravenous  Once 12/25/20 1517 12/25/20 1758   12/25/20 1530  fluconazole (DIFLUCAN) IVPB 200 mg        200 mg 100 mL/hr over 60 Minutes Intravenous Every 24 hours 12/25/20 1517 12/31/20 0001   12/25/20 1516  vancomycin variable dose per unstable renal function (pharmacist dosing)  Status:  Discontinued         Does not apply See admin instructions 12/25/20 1517 12/27/20 1038   12/25/20 1430  piperacillin-tazobactam (ZOSYN) IVPB 3.375 g        3.375 g 100 mL/hr over 30 Minutes Intravenous  Once 12/25/20 1427 12/25/20 1612      Assessment/Plan: Problem List: Patient Active Problem List   Diagnosis Date Noted  . Peritonitis ([place]) 12/25/2020  . Bowel perforation ([place]) 12/25/2020  . Perforated duodenal ulcer ([place])   .  AKI (acute kidney injury) ([place])   . Aftercare following surgery of the circulatory system, [place] 08/09/2014  . Occlusion and stenosis of carotid artery without mention of cerebral infarction 07/27/2013  . History of sigmoid colon cancer, T4, N0. Resected 04/03/2011. 10/30/2011    Very deconditioned and may need SNF placement.   5 Days Post-Op    LOS: 5 days   Matt  B. Hassell Done, MD, [facility] [phone] to reach the surgeon on call.    12/30/2020 9:53 AM

## 2020-12-30 NOTE — Progress Notes (Signed)
Progress Note    [name]  [id number] DOB: 1932-09-20  DOA: 12/25/2020 PCP: Jonathon Jordan, MD    Brief Narrative:     Medical records reviewed and are as summarized below:  [name] is an 87 y.o. female with medical history significant of HTN, HLD, colon CA status post partial colectomy, presented with severe abdominal pain. Found to have a perforated duodenal ulcer and underwent an exploratory laparotomy with graham patch repair with J-tube placement  Assessment/Plan:   Acute peritonitis/pneumoperitoneum Duodenal perforation -Underwent ex lap with Phillip Heal patch repair, placement of jejunostomy tube and JP drain on 1/17 -Started on tube feeds 1/19, minimal output via JP drain  -Upper GI series was negative for a leak, started clear liquids yesterday -Increase activity, ambulate as tolerated -PT OT eval -Labs in a.m. -Discontinue antibiotics tomorrow  AKI -Resolved  Frequent PVCs, nonsustained ventricular tachycardia -Continue low-dose IV beta-blocker, recheck mag -Replace potassium, check 2D echocardiogram  Uncontrolled hypertension - IV labetalol as needed for now -Continue lisinopril, add amlodipine  Severe protein calorie malnutrition, prealbumin is 9.1 -Continue tube feeds, advance diet as tolerated  History of AAA -outpatient vascular surgery for follow-up.  Incidental finding -CT abdomen pelvis noted liver and kidney lesions suspected to be benign, consider MRI down the road to reassess this   Family Communication/Anticipated D/C date and plan/Code Status   DVT prophylaxis: heparin Code Status: Full Code.  Family communication: No family at bedside Disposition Plan: Status is: Inpatient  Remains inpatient appropriate because:Inpatient level of care appropriate due to severity of illness   Dispo: The patient is from: Home              Anticipated d/c is to: SNF              Anticipated d/c date is: > 3 days               Patient currently is not medically stable to d/c.    Consultants:    General surgery  Procedures: Exploratory laparotomy, Phillip Heal patch repair of perforated duodenal ulcer, placement of feeding jejunostomy tube, Dr. Zenia Resides 1/17   Subjective:   -Still complains of being sore but overall improving  Objective:    Vitals:   12/29/20 2325 12/30/20 0400 12/30/20 1105 12/30/20 1107  BP: (!) 149/83 (!) 164/91 (!) 169/87 (!) 169/87  Pulse: 81 89  91  Resp: 20 20  20   Temp: 98 F (36.7 C) 97.7 F (36.5 C)  98.3 F (36.8 C)  TempSrc: Oral Oral  Oral  SpO2: 94% 95%  95%  Weight:  55.9 kg    Height:        Intake/Output Summary (Last 24 hours) at 12/30/2020 1421 Last data filed at 12/30/2020 [id number] Gross per 24 hour  Intake 1241.31 ml  Output 157 ml  Net 1084.31 ml   Filed Weights   12/25/20 2022 12/30/20 0400  Weight: 56 kg 55.9 kg    Exam   Elderly frail female sitting up in bed, AAOx3, nondistressed ENT: No JVD CVS: S1-S2, regular rate rhythm Lungs: Decreased breath sounds at both bases, otherwise clear Abdomen: Soft, abdominal binder noted, minimal tenderness, J-tube with tube feeds and JP drain with minimal output Extremities: No edema  skin: No rashes on exposed skin   Data Reviewed:   I have personally reviewed following labs and imaging studies:  Labs: Labs show the following:   Basic Metabolic Panel: Recent Labs  Lab 12/25/20 2039 12/26/20 0040 12/27/20  8938 12/28/20 0035 12/29/20 0044 12/30/20 0133 12/30/20 0650  NA  --  139 136 138 137 137  --   K  --  5.1 4.0 3.8 3.8 3.3*  --   CL  --  108 107 108 104 104  --   CO2  --  20* 18* 20* 23 23  --   GLUCOSE  --  141* 85 95 130* 156*  --   BUN  --  32* 29* 27* 19 17  --   CREATININE  --  1.82* 1.73* 1.48* 1.14* 1.08*  --   CALCIUM  --  7.3* 7.2* 7.6* 8.2* 8.0*  --   MG 1.7 1.7  --   --   --   --  1.6*  PHOS 3.2  --   --   --   --   --   --    GFR Estimated Creatinine Clearance: 30.9 mL/min (A)  (by C-G formula based on SCr of 1.08 mg/dL (H)). Liver Function Tests: Recent Labs  Lab 12/25/20 1250 12/26/20 0040 12/27/20 0048  AST 16 23 15   ALT 10 13 11   ALKPHOS 47 31* 33*  BILITOT 0.9 0.8 1.2  PROT 6.4* 5.6* 5.2*  ALBUMIN 3.6 3.3* 2.7*   No results for input(s): LIPASE, AMYLASE in the last 168 hours. No results for input(s): AMMONIA in the last 168 hours. Coagulation profile No results for input(s): INR, PROTIME in the last 168 hours.  CBC: Recent Labs  Lab 12/25/20 1250 12/25/20 1255 12/26/20 0040 12/27/20 0048 12/28/20 0035 12/29/20 0044 12/30/20 0133  WBC 16.2*  --  16.9* 13.9* 12.0* 9.9 6.5  NEUTROABS 14.3*  --   --   --   --   --   --   HGB 12.7   < > 10.7* 9.5* 9.5* 10.8* 10.1*  HCT 39.1   < > 32.0* 29.7* 30.1* 32.8* 30.2*  MCV 96.3  --  93.3 94.3 93.8 91.4 90.7  PLT 220  --  174 145* 166 204 195   < > = values in this interval not displayed.   Cardiac Enzymes: No results for input(s): CKTOTAL, CKMB, CKMBINDEX, TROPONINI in the last 168 hours. BNP (last 3 results) No results for input(s): PROBNP in the last 8760 hours. CBG: Recent Labs  Lab 12/29/20 1938 12/29/20 2324 12/30/20 0358 12/30/20 0819 12/30/20 1106  GLUCAP 132* 142* 149* 130* 114*   D-Dimer: No results for input(s): DDIMER in the last 72 hours. Hgb A1c: No results for input(s): HGBA1C in the last 72 hours. Lipid Profile: No results for input(s): CHOL, HDL, LDLCALC, TRIG, CHOLHDL, LDLDIRECT in the last 72 hours. Thyroid function studies: No results for input(s): TSH, T4TOTAL, T3FREE, THYROIDAB in the last 72 hours.  Invalid input(s): FREET3 Anemia work up: No results for input(s): VITAMINB12, FOLATE, FERRITIN, TIBC, IRON, RETICCTPCT in the last 72 hours. Sepsis Labs: Recent Labs  Lab 12/25/20 1240 12/25/20 1250 12/25/20 2039 12/26/20 0040 12/27/20 0048 12/28/20 0035 12/29/20 0044 12/30/20 0133  WBC  --    < >  --    < > 13.9* 12.0* 9.9 6.5  LATICACIDVEN 1.7  --  1.3  --    --   --   --   --    < > = values in this interval not displayed.    Microbiology Recent Results (from the past 240 hour(s))  SARS Coronavirus 2 by RT PCR (hospital order, performed in [facility] hospital lab)     Status: None  Collection Time: 12/25/20  3:12 PM  Result Value Ref Range Status   SARS Coronavirus 2 NEGATIVE NEGATIVE Final    Comment: (NOTE) SARS-CoV-2 target nucleic acids are NOT DETECTED.  The SARS-CoV-2 RNA is generally detectable in upper and lower respiratory specimens during the acute phase of infection. The lowest concentration of SARS-CoV-2 viral copies this assay can detect is 250 copies / mL. A negative result does not preclude SARS-CoV-2 infection and should not be used as the sole basis for treatment or other patient management decisions.  A negative result may occur with improper specimen collection / handling, submission of specimen other than nasopharyngeal swab, presence of viral mutation(s) within the areas targeted by this assay, and inadequate number of viral copies (<250 copies / mL). A negative result must be combined with clinical observations, patient history, and epidemiological information.  Fact Sheet for Patients:   StrictlyIdeas.no  Fact Sheet for Healthcare Providers: BankingDealers.co.za  This test is not yet approved or  cleared by the [place] FDA and has been authorized for detection and/or diagnosis of SARS-CoV-2 by FDA under an Emergency Use Authorization (EUA).  This EUA will remain in effect (meaning this test can be used) for the duration of the COVID-19 declaration under Section 564(b)(1) of the Act, 21 U.S.C. section 360bbb-3(b)(1), unless the authorization is terminated or revoked sooner.  Performed at [facility], [place] [address] [place], [place] [address]     Procedures and diagnostic studies:  DG UGI W SINGLE CM (SOL OR THIN BA)  Result Date:  12/29/2020 CLINICAL DATA:  Status post Phillip Heal patch repair of perforated ulcer in the duodenal bulb. Contrast 50 cc Omnipaque 300 EXAM: WATER SOLUBLE UPPER GI SERIES TECHNIQUE: Single-column upper GI series was performed using water soluble contrast. CONTRAST:  50 cc Omnipaque 300 COMPARISON:  CT scan 12/25/2020. FLUOROSCOPY TIME:  Fluoroscopy Time:  1 minutes and 30 seconds. Radiation Exposure Index (if provided by the fluoroscopic device): 67.2 mGy Number of Acquired Spot Images: FINDINGS: Pre-procedure KUB shows diffuse gaseous distention of small bowel and colon compatible with underlying component of ileus. Presumed jejunostomy tube over the left abdomen. JP drain identified over the right abdomen with the tip positioned in the region of the pylorus/duodenal bulb. Patient was placed RPO relative to the fluoro table and given sips of water soluble contrast material. Contrast flowed readily into the stomach with prompt gastric emptying into the duodenum. With additional sips of contrast, there is continued gastric emptying to the level of the transverse duodenum. Deformation of the duodenal bulb is consistent with the recent surgery. Patient was subsequently evaluated supine and LPO positions as well. Duodenal contrast material was visualized passing the ligament of Treitz into the proximal jejunum. At no time during the exam was contrast extravasation observed in the region of the duodenal bulb or elsewhere along the distal stomach/duodenum. No opacification of the adjacent surgical drain was evident. IMPRESSION: 1. Brisk/prompt gastric emptying with no evidence for contrast extravasation in the region of the pylorus/duodenal bulb. Postoperative changes are evident in the duodenal bulb. No findings to suggest postoperative leak on today's exam. 2. No gastric, duodenal or proximal jejunal distention. Electronically Signed   By: Misty Stanley M.D.   On: 12/29/2020 08:45    Medications:   . (feeding  supplement) PROSource Plus  30 mL Oral TID BM  . Chlorhexidine Gluconate Cloth  6 each Topical Daily  . feeding supplement  1 Container Oral TID BM  . heparin  5,000  Units Subcutaneous Q8H  . lisinopril  20 mg Oral Daily   And  . hydrochlorothiazide  12.5 mg Oral Daily  . methocarbamol  500 mg Oral TID  . pantoprazole (PROTONIX) IV  40 mg Intravenous Q12H  . rosuvastatin  10 mg Oral QPM   Continuous Infusions: . feeding supplement (OSMOLITE 1.2 CAL) 50 mL/hr at 12/30/20 0141  . fluconazole (DIFLUCAN) IV Stopped (12/29/20 2041)  . magnesium sulfate bolus IVPB    . piperacillin-tazobactam (ZOSYN)  IV 3.375 g (12/30/20 1118)     LOS: 5 days   [facility]  12/30/2020, 2:21 PM

## 2020-12-31 ENCOUNTER — Inpatient Hospital Stay (HOSPITAL_COMMUNITY): Payer: Medicare Other

## 2020-12-31 DIAGNOSIS — R9431 Abnormal electrocardiogram [ECG] [EKG]: Secondary | ICD-10-CM | POA: Diagnosis not present

## 2020-12-31 DIAGNOSIS — K631 Perforation of intestine (nontraumatic): Secondary | ICD-10-CM | POA: Diagnosis not present

## 2020-12-31 DIAGNOSIS — K659 Peritonitis, unspecified: Secondary | ICD-10-CM | POA: Diagnosis not present

## 2020-12-31 DIAGNOSIS — N179 Acute kidney failure, unspecified: Secondary | ICD-10-CM | POA: Diagnosis not present

## 2020-12-31 DIAGNOSIS — K265 Chronic or unspecified duodenal ulcer with perforation: Secondary | ICD-10-CM | POA: Diagnosis not present

## 2020-12-31 LAB — ECHOCARDIOGRAM COMPLETE
Area-P 1/2: 1.77 cm2
Height: 63 in
S' Lateral: 2.4 cm
Weight: 1929.47 oz

## 2020-12-31 LAB — BASIC METABOLIC PANEL
Anion gap: 10 (ref 5–15)
BUN: 20 mg/dL (ref 8–23)
CO2: 22 mmol/L (ref 22–32)
Calcium: 8 mg/dL — ABNORMAL LOW (ref 8.9–10.3)
Chloride: 107 mmol/L (ref 98–111)
Creatinine, Ser: 1.06 mg/dL — ABNORMAL HIGH (ref 0.44–1.00)
GFR, Estimated: 51 mL/min — ABNORMAL LOW (ref >60)
Glucose, Bld: 142 mg/dL — ABNORMAL HIGH (ref 70–99)
Potassium: 4.5 mmol/L (ref 3.5–5.1)
Sodium: 139 mmol/L (ref 135–145)

## 2020-12-31 LAB — GLUCOSE, CAPILLARY
Glucose-Capillary: 129 mg/dL — ABNORMAL HIGH (ref 70–99)
Glucose-Capillary: 140 mg/dL — ABNORMAL HIGH (ref 70–99)
Glucose-Capillary: 144 mg/dL — ABNORMAL HIGH (ref 70–99)
Glucose-Capillary: 97 mg/dL (ref 70–99)

## 2020-12-31 LAB — CBC
HCT: 31.2 % — ABNORMAL LOW (ref 36.0–46.0)
Hemoglobin: 10.3 g/dL — ABNORMAL LOW (ref 12.0–15.0)
MCH: 30.5 pg (ref 26.0–34.0)
MCHC: 33 g/dL (ref 30.0–36.0)
MCV: 92.3 fL (ref 80.0–100.0)
Platelets: 220 10*3/uL (ref 150–400)
RBC: 3.38 MIL/uL — ABNORMAL LOW (ref 3.87–5.11)
RDW: 12.9 % (ref 11.5–15.5)
WBC: 8.1 10*3/uL (ref 4.0–10.5)
nRBC: 0 % (ref 0.0–0.2)

## 2020-12-31 MED ORDER — LISINOPRIL 10 MG PO TABS
10.0000 mg | ORAL_TABLET | Freq: Every day | ORAL | Status: DC
  Administered 2020-12-31 – 2021-01-02 (×3): 10 mg via ORAL
  Filled 2020-12-31 (×3): qty 1

## 2020-12-31 MED ORDER — OSMOLITE 1.2 CAL PO LIQD
1000.0000 mL | ORAL | Status: DC
  Administered 2020-12-31: 1000 mL
  Filled 2020-12-31: qty 1000

## 2020-12-31 MED ORDER — PANTOPRAZOLE SODIUM 40 MG PO TBEC
40.0000 mg | DELAYED_RELEASE_TABLET | Freq: Every day | ORAL | Status: DC
  Administered 2021-01-01 – 2021-01-02 (×2): 40 mg via ORAL
  Filled 2020-12-31: qty 1

## 2020-12-31 NOTE — Plan of Care (Signed)

## 2020-12-31 NOTE — Progress Notes (Addendum)
[facility] Progress Note  6 Days Post-Op  Subjective: CC-  Feeling well this morning. Denies any current abdominal pain, nausea, or vomiting. She was taking in only clear liquids yesterday and vomited once last night. Passing flatus this morning and had 2 loose stools over night. TF at 50cc/hr. JP drain with no output. States that she did get OOB yesterday and sit in the chair for a while. WBC 8.1, afebrile.  Objective: Vital signs in last 24 hours: Temp:  [97.8 F (36.6 C)-98.6 F (37 C)] 98.1 F (36.7 C) (01/23 0800) Pulse Rate:  [89-100] 90 (01/23 0733) Resp:  [17-20] 18 (01/23 0733) BP: (129-169)/(66-97) 129/71 (01/23 0733) SpO2:  [93 %-96 %] 94 % (01/23 0733) Weight:  [54.7 kg] 54.7 kg (01/23 0618) Last BM Date: 12/30/20  Intake/Output from previous day: 01/22 0701 - 01/23 0700 In: 385.9 [P.O.:120; IV Piggyback:265.9] Out: 1 [Stool:1] Intake/Output this shift: No intake/output data recorded.  PE: General: alert, NAD Heart: RRR Lungs: CTAB, no wheezes, rhonchi, or rales noted. Respiratory effort nonlabored Abd: soft, nontender, ND, +BS,drain in RLQ withscant SSfluid in bulb, Jtubefeeds running at 50 cc/h, midline incision c/d/i with staples intact and no erythema or drainage  Lab Results:  Recent Labs    12/30/20 0133 12/31/20 0120  WBC 6.5 8.1  HGB 10.1* 10.3*  HCT 30.2* 31.2*  PLT 195 220   BMET Recent Labs    12/30/20 0133 12/31/20 0120  NA 137 139  K 3.3* 4.5  CL 104 107  CO2 23 22  GLUCOSE 156* 142*  BUN 17 20  CREATININE 1.08* 1.06*  CALCIUM 8.0* 8.0*   PT/INR No results for input(s): LABPROT, INR in the last 72 hours. CMP     Component Value Date/Time   NA 139 12/31/2020 0120   K 4.5 12/31/2020 0120   CL 107 12/31/2020 0120   CO2 22 12/31/2020 0120   GLUCOSE 142 (H) 12/31/2020 0120   BUN 20 12/31/2020 0120   CREATININE 1.06 (H) 12/31/2020 0120   CALCIUM 8.0 (L) 12/31/2020 0120   PROT 5.2 (L) 12/27/2020 0048    ALBUMIN 2.7 (L) 12/27/2020 0048   AST 15 12/27/2020 0048   ALT 11 12/27/2020 0048   ALKPHOS 33 (L) 12/27/2020 0048   BILITOT 1.2 12/27/2020 0048   GFRNONAA 51 (L) 12/31/2020 0120   GFRAA >90 08/01/2012 0436   Lipase  No results found for: LIPASE     Studies/Results: No results found.  Anti-infectives: Anti-infectives (From admission, onward)   Start     Dose/Rate Route Frequency Ordered Stop   12/28/20 1400  piperacillin-tazobactam (ZOSYN) IVPB 3.375 g        3.375 g 12.5 mL/hr over 240 Minutes Intravenous Every 8 hours 12/28/20 0652 12/31/20 0535   12/26/20 2200  piperacillin-tazobactam (ZOSYN) IVPB 2.25 g  Status:  Discontinued        2.25 g 100 mL/hr over 30 Minutes Intravenous Every 8 hours 12/26/20 1530 12/28/20 0652   12/26/20 1700  vancomycin (VANCOREADY) IVPB 500 mg/100 mL  Status:  Discontinued        500 mg 100 mL/hr over 60 Minutes Intravenous Every 24 hours 12/26/20 1129 12/27/20 1038   12/25/20 2200  piperacillin-tazobactam (ZOSYN) IVPB 3.375 g  Status:  Discontinued        3.375 g 12.5 mL/hr over 240 Minutes Intravenous Every 8 hours 12/25/20 1517 12/26/20 1530   12/25/20 1530  vancomycin (VANCOREADY) IVPB 1250 mg/250 mL  1,250 mg 166.7 mL/hr over 90 Minutes Intravenous  Once 12/25/20 1517 12/25/20 1758   12/25/20 1530  fluconazole (DIFLUCAN) IVPB 200 mg        200 mg 100 mL/hr over 60 Minutes Intravenous Every 24 hours 12/25/20 1517 12/30/20 1831   12/25/20 1516  vancomycin variable dose per unstable renal function (pharmacist dosing)  Status:  Discontinued         Does not apply See admin instructions 12/25/20 1517 12/27/20 1038   12/25/20 1430  piperacillin-tazobactam (ZOSYN) IVPB 3.375 g        3.375 g 100 mL/hr over 30 Minutes Intravenous  Once 12/25/20 1427 12/25/20 1612       Assessment/Plan Hx of colon cancer - s/p resection and colostomy takedown HTN HLD Chronic pain AKI - Cr 1.81from baseline of 0.69, improving but continue to  monitor Peripheral vascular disease AAA - infrarenal 4.3 cm- f/u per radiology guidelines Osteoporosis ABL anemia - hgb10.3, stable Severe protein calorie malnutrition - prealbumin 9.1 (1/19)  Pneumoperitoneum with duodenal perforation S/p exploratory laparotomy, Phillip Heal patch, placement of jejunostomy 12/25/20 Dr. Zenia Resides - POD#6 - UGI negative for leak 1/21 - H. Pylorinegative - JP with0cc output last 24hr - she will complete 5 days abx today - Continue J-tube feedings until reliably tolerating diet - WBC is WNL and she is afebrile. Abdominal exam benign this morning. She did vomit once yesterday so continue CLD today and see how she does. Having bowel function. Continue mobilizing.   FEN: CLD, breeze VTE: SQ heparin ID: Zosyn/vanc 1/17>>1/23 Foley: removed 1/18 Follow up: Dr. Zenia Resides   LOS: 6 days    Wellington Hampshire, [facility] 12/31/2020, 9:32 AM Please see Amion for pager number during day hours 7:00am-4:30pm

## 2020-12-31 NOTE — Progress Notes (Signed)
Progress Note    [name]  OEV:[id number] DOB: 1932-05-14  DOA: 12/25/2020 PCP: Jonathon Jordan, MD    Brief Narrative:     Medical records reviewed and are as summarized below:  [name] is an 87 y.o. female with medical history significant of HTN, HLD, colon CA status post partial colectomy, presented with severe abdominal pain. Found to have a perforated duodenal ulcer and underwent an exploratory laparotomy with graham patch repair with J-tube placement  Assessment/Plan:   Acute peritonitis/pneumoperitoneum Duodenal perforation -Underwent ex lap with Phillip Heal patch repair, placement of jejunostomy tube and JP drain on 1/17 -Started on tube feeds 1/19, minimal output via JP drain  -Upper GI series was negative for a leak,  -Started clear liquids, will cut down tube feeds to 12 hours-overnight -Discontinue antibiotics and antifungals -Ambulate, PT OT, labs in a.m.  AKI -Resolved  Frequent PVCs, nonsustained ventricular tachycardia -Continue low-dose IV beta-blocker, repleted mag -2D echo pending  Uncontrolled hypertension - IV labetalol as needed for now -Continue lisinopril, amlodipine  Severe protein calorie malnutrition, prealbumin is 9.1 -Continue tube feeds, advance diet as tolerated  History of AAA -outpatient vascular surgery for follow-up.  Incidental finding -CT abdomen pelvis noted liver and kidney lesions suspected to be benign, consider MRI down the road to reassess this   Family Communication/Anticipated D/C date and plan/Code Status   DVT prophylaxis: heparinSQ Code Status: Full Code.  Family communication: No family at bedside Disposition Plan: Status is: Inpatient  Remains inpatient appropriate because:Inpatient level of care appropriate due to severity of illness   Dispo: The patient is from: Home              Anticipated d/c is to: SNF              Anticipated d/c date is: > 3 days              Patient  currently is not medically stable to d/c.    Consultants:    General surgery  Procedures: Exploratory laparotomy, Phillip Heal patch repair of perforated duodenal ulcer, placement of feeding jejunostomy tube, Dr. Zenia Resides 1/17   Subjective:   -Episode of vomiting yesterday, minimal oral intake, having diarrhea with tube feeds  Objective:    Vitals:   12/31/20 0618 12/31/20 0733 12/31/20 0800 12/31/20 1124  BP:  129/71  (!) 145/87  Pulse:  90    Resp:  18  20  Temp:  98.1 F (36.7 C) 98.1 F (36.7 C) 98.1 F (36.7 C)  TempSrc:  Oral Oral Oral  SpO2:  94%    Weight: 54.7 kg     Height:        Intake/Output Summary (Last 24 hours) at 12/31/2020 1142 Last data filed at 12/31/2020 1114 Gross per 24 hour  Intake 1440.6 ml  Output 1 ml  Net 1439.6 ml   Filed Weights   12/25/20 2022 12/30/20 0400 12/31/20 0618  Weight: 56 kg 55.9 kg 54.7 kg    Exam   Frail elderly female sitting up in bed, AAO x2, no distress HEENT: No JVD CVS: S1-S2, regular rate rhythm Lungs: Decreased breath sounds both bases Abdomen: Soft, mild tenderness only, J-tube with tube feeds and JP drain without any output Extremities: No edema skin: No rashes on exposed skin   Data Reviewed:   I have personally reviewed following labs and imaging studies:  Labs: Labs show the following:   Basic Metabolic Panel: Recent Labs  Lab 12/25/20 2039 12/26/20  0040 12/27/20 0048 12/28/20 0035 12/29/20 0044 12/30/20 0133 12/30/20 0650 12/31/20 0120  NA  --  139 136 138 137 137  --  139  K  --  5.1 4.0 3.8 3.8 3.3*  --  4.5  CL  --  108 107 108 104 104  --  107  CO2  --  20* 18* 20* 23 23  --  22  GLUCOSE  --  141* 85 95 130* 156*  --  142*  BUN  --  32* 29* 27* 19 17  --  20  CREATININE  --  1.82* 1.73* 1.48* 1.14* 1.08*  --  1.06*  CALCIUM  --  7.3* 7.2* 7.6* 8.2* 8.0*  --  8.0*  MG 1.7 1.7  --   --   --   --  1.6*  --   PHOS 3.2  --   --   --   --   --   --   --    GFR Estimated Creatinine  Clearance: 31.5 mL/min (A) (by C-G formula based on SCr of 1.06 mg/dL (H)). Liver Function Tests: Recent Labs  Lab 12/25/20 1250 12/26/20 0040 12/27/20 0048  AST 16 23 15   ALT 10 13 11   ALKPHOS 47 31* 33*  BILITOT 0.9 0.8 1.2  PROT 6.4* 5.6* 5.2*  ALBUMIN 3.6 3.3* 2.7*   No results for input(s): LIPASE, AMYLASE in the last 168 hours. No results for input(s): AMMONIA in the last 168 hours. Coagulation profile No results for input(s): INR, PROTIME in the last 168 hours.  CBC: Recent Labs  Lab 12/25/20 1250 12/25/20 1255 12/27/20 0048 12/28/20 0035 12/29/20 0044 12/30/20 0133 12/31/20 0120  WBC 16.2*   < > 13.9* 12.0* 9.9 6.5 8.1  NEUTROABS 14.3*  --   --   --   --   --   --   HGB 12.7   < > 9.5* 9.5* 10.8* 10.1* 10.3*  HCT 39.1   < > 29.7* 30.1* 32.8* 30.2* 31.2*  MCV 96.3   < > 94.3 93.8 91.4 90.7 92.3  PLT 220   < > 145* 166 204 195 220   < > = values in this interval not displayed.   Cardiac Enzymes: No results for input(s): CKTOTAL, CKMB, CKMBINDEX, TROPONINI in the last 168 hours. BNP (last 3 results) No results for input(s): PROBNP in the last 8760 hours. CBG: Recent Labs  Lab 12/30/20 1614 12/30/20 2115 12/31/20 0001 12/31/20 0403 12/31/20 0735  GLUCAP 123* 138* 129* 144* 140*   D-Dimer: No results for input(s): DDIMER in the last 72 hours. Hgb A1c: No results for input(s): HGBA1C in the last 72 hours. Lipid Profile: No results for input(s): CHOL, HDL, LDLCALC, TRIG, CHOLHDL, LDLDIRECT in the last 72 hours. Thyroid function studies: No results for input(s): TSH, T4TOTAL, T3FREE, THYROIDAB in the last 72 hours.  Invalid input(s): FREET3 Anemia work up: No results for input(s): VITAMINB12, FOLATE, FERRITIN, TIBC, IRON, RETICCTPCT in the last 72 hours. Sepsis Labs: Recent Labs  Lab 12/25/20 1240 12/25/20 1250 12/25/20 2039 12/26/20 0040 12/28/20 0035 12/29/20 0044 12/30/20 0133 12/31/20 0120  WBC  --    < >  --    < > 12.0* 9.9 6.5 8.1   LATICACIDVEN 1.7  --  1.3  --   --   --   --   --    < > = values in this interval not displayed.    Microbiology Recent Results (from the past 240 hour(s))  SARS Coronavirus 2 by RT PCR (hospital order, performed in [place] hospital lab)     Status: None   Collection Time: 12/25/20  3:12 PM  Result Value Ref Range Status   SARS Coronavirus 2 NEGATIVE NEGATIVE Final    Comment: (NOTE) SARS-CoV-2 target nucleic acids are NOT DETECTED.  The SARS-CoV-2 RNA is generally detectable in upper and lower respiratory specimens during the acute phase of infection. The lowest concentration of SARS-CoV-2 viral copies this assay can detect is 250 copies / mL. A negative result does not preclude SARS-CoV-2 infection and should not be used as the sole basis for treatment or other patient management decisions.  A negative result may occur with improper specimen collection / handling, submission of specimen other than nasopharyngeal swab, presence of viral mutation(s) within the areas targeted by this assay, and inadequate number of viral copies (<250 copies / mL). A negative result must be combined with clinical observations, patient history, and epidemiological information.  Fact Sheet for Patients:   StrictlyIdeas.no  Fact Sheet for Healthcare Providers: BankingDealers.co.za  This test is not yet approved or  cleared by the [place] FDA and has been authorized for detection and/or diagnosis of SARS-CoV-2 by FDA under an Emergency Use Authorization (EUA).  This EUA will remain in effect (meaning this test can be used) for the duration of the COVID-19 declaration under Section 564(b)(1) of the Act, 21 U.S.C. section 360bbb-3(b)(1), unless the authorization is terminated or revoked sooner.  Performed at [facility], [place] [address], [place], [place] [address]     Procedures and diagnostic studies:  No results  found.  Medications:   . (feeding supplement) PROSource Plus  30 mL Oral TID BM  . amLODipine  10 mg Oral Daily  . feeding supplement  1 Container Oral TID BM  . heparin  5,000 Units Subcutaneous Q8H  . lisinopril  10 mg Oral Daily  . methocarbamol  500 mg Oral TID  . pantoprazole  40 mg Oral Q1200  . rosuvastatin  10 mg Oral QPM   Continuous Infusions: . feeding supplement (OSMOLITE 1.2 CAL)       LOS: 6 days   Domenic Polite  [facility]  12/31/2020, 11:42 AM

## 2020-12-31 NOTE — Progress Notes (Signed)
*  PRELIMINARY RESULTS* Echocardiogram 2D Echocardiogram has been performed.  Leavy Cella 12/31/2020, 12:01 PM

## 2021-01-01 DIAGNOSIS — K631 Perforation of intestine (nontraumatic): Secondary | ICD-10-CM | POA: Diagnosis not present

## 2021-01-01 DIAGNOSIS — N179 Acute kidney failure, unspecified: Secondary | ICD-10-CM | POA: Diagnosis not present

## 2021-01-01 DIAGNOSIS — K265 Chronic or unspecified duodenal ulcer with perforation: Secondary | ICD-10-CM | POA: Diagnosis not present

## 2021-01-01 DIAGNOSIS — K659 Peritonitis, unspecified: Secondary | ICD-10-CM | POA: Diagnosis not present

## 2021-01-01 LAB — GLUCOSE, CAPILLARY
Glucose-Capillary: 101 mg/dL — ABNORMAL HIGH (ref 70–99)
Glucose-Capillary: 110 mg/dL — ABNORMAL HIGH (ref 70–99)
Glucose-Capillary: 114 mg/dL — ABNORMAL HIGH (ref 70–99)
Glucose-Capillary: 121 mg/dL — ABNORMAL HIGH (ref 70–99)
Glucose-Capillary: 124 mg/dL — ABNORMAL HIGH (ref 70–99)
Glucose-Capillary: 92 mg/dL (ref 70–99)

## 2021-01-01 LAB — PREALBUMIN: Prealbumin: 16.7 mg/dL — ABNORMAL LOW (ref 18–38)

## 2021-01-01 MED ORDER — BOOST / RESOURCE BREEZE PO LIQD CUSTOM
1.0000 | Freq: Two times a day (BID) | ORAL | Status: DC
  Administered 2021-01-01: 1 via ORAL

## 2021-01-01 MED ORDER — CALCIUM POLYCARBOPHIL 625 MG PO TABS
625.0000 mg | ORAL_TABLET | Freq: Two times a day (BID) | ORAL | Status: DC
  Administered 2021-01-01 – 2021-01-02 (×4): 625 mg via ORAL
  Filled 2021-01-01 (×4): qty 1

## 2021-01-01 MED ORDER — OSMOLITE 1.2 CAL PO LIQD
720.0000 mL | ORAL | Status: DC
  Administered 2021-01-01: 720 mL
  Filled 2021-01-01 (×2): qty 948

## 2021-01-01 MED ORDER — METHOCARBAMOL 500 MG PO TABS: 500.0000 mg | ORAL_TABLET | Freq: Three times a day (TID) | ORAL | Status: DC | PRN

## 2021-01-01 MED ORDER — ENSURE ENLIVE PO LIQD: 237.0000 mL | Freq: Three times a day (TID) | ORAL | Status: DC

## 2021-01-01 MED ORDER — METOPROLOL TARTRATE 12.5 MG HALF TABLET
12.5000 mg | ORAL_TABLET | Freq: Two times a day (BID) | ORAL | Status: DC
  Administered 2021-01-01 – 2021-01-02 (×4): 12.5 mg via ORAL
  Filled 2021-01-01 (×4): qty 1

## 2021-01-01 MED ORDER — LIDOCAINE 5 % EX PTCH
1.0000 | MEDICATED_PATCH | Freq: Every day | CUTANEOUS | Status: DC
  Administered 2021-01-01 – 2021-01-02 (×2): 1 via TRANSDERMAL
  Filled 2021-01-01 (×2): qty 1

## 2021-01-01 NOTE — Progress Notes (Signed)
Nutrition Follow-up  RD working remotely.  DOCUMENTATION CODES:   Not applicable  INTERVENTION:   Continue nocturnal tube feeds via J-tube: - Osmolite 1.2 @ 60 ml/hr over 12 hours from 1800 to 0600 (total of 720 ml)  Nocturnal tube feeding regimen provides 864 kcal, 40 grams of protein, and 590 ml of H2O (meets 58% of kcal needs and 57% of protein needs).  - Ensure Enlive po TID, each supplement provides 350 kcal and 20 grams of protein  - Boost Breeze po BID, each supplement provides 250 kcal and 9 grams of protein  - d/c ProSource Plus  NUTRITION DIAGNOSIS:   Inadequate oral intake related to altered GI function as evidenced by other (clear liquid diet).  Progressing, pt now on full liquid diet  GOAL:   Patient will meet greater than or equal to 90% of their needs  Progressing  MONITOR:   Supplement acceptance,Diet advancement,PO intake,TF tolerance,Weight trends  REASON FOR ASSESSMENT:   Consult Enteral/tube feeding initiation and management (trickle tube feeds at 10 ml/hr via J-tube)  ASSESSMENT:   87 year old female who presented to the ED on 1/17 with abdominal pain. PMH of HTN, HLD, remote history of colon cancer s/p resection and colostomy takedown, chronic pain, PVD, AAA, osteoporosis. Pt admitted with pneumoperitoneum with possible duodenal perforation.  1/17 - s/p ex-lap, graham patch repair of duodenal ulcer, placement of feeding J-tube 1/19 - trickle tube feeds initiated at 10 ml/hr 1/20 - tube feeds advanced to 20 ml/hr 1/21 - UGI negative for leak, clear liquid diet initiated, tube feeds discontinued 1/24 - advanced to full liquid diet  No meal completions documented. MD has adjusted tube feeds to be nocturnal. RD will adjust rate to better meet pt's needs. Will also adjust supplement regimen now that diet has been advanced to full liquid.  Current weight: 55.4 kg Admit weight: 56 kg  Medications reviewed and include: Boost Breeze TID, ProSource  Plus 30 ml TID, protonix, fibercon  Labs reviewed: creatinine 1.06 CBG's: 97-124 x 24 hours  Diet Order:   Diet Order            Diet full liquid Room service appropriate? Yes; Fluid consistency: Thin  Diet effective now                 EDUCATION NEEDS:   No education needs have been identified at this time  Skin:  Skin Assessment: Skin Integrity Issues: Incisions: abdomen  Last BM:  12/31/20  Height:   Ht Readings from Last 1 Encounters:  12/25/20 5\' 3"  (1.6 m)    Weight:   Wt Readings from Last 1 Encounters:  01/01/21 53.9 kg    BMI:  Body mass index is 21.05 kg/m.  Estimated Nutritional Needs:   Kcal:  1500-1700  Protein:  70-85 grams  Fluid:  1.5-1.7 L    Gustavus Bryant, MS, RD, LDN Inpatient Clinical Dietitian Please see AMiON for contact information.

## 2021-01-01 NOTE — Progress Notes (Signed)
Progress Note    [name]  NID:[id number] DOB: [birth year]  DOA: 12/25/2020 PCP: Jonathon Jordan, MD    Brief Narrative:     Medical records reviewed and are as summarized below:  [name] is an 87 y.o. female with medical history significant of HTN, HLD, colon CA status post partial colectomy, presented with severe abdominal pain. Found to have a perforated duodenal ulcer and underwent an exploratory laparotomy with graham patch repair with J-tube placement  Assessment/Plan:   Acute peritonitis/pneumoperitoneum Duodenal perforation -Underwent ex lap with Phillip Heal patch repair, placement of jejunostomy tube and JP drain on 1/17 -Started on tube feeds 1/19, minimal output via JP drain  -Upper GI series was negative for a leak, diet advanced to full liquids -Now on tube feeds overnight, wean this off as tolerated -Off antibiotics and antifungals -Ambulate, PT OT -Discharge planning  AKI -Resolved  Frequent PVCs, nonsustained ventricular tachycardia -Improved, electrolytes repleted, low-dose beta-blocker -2D echo with preserved EF moderate LVH  Uncontrolled hypertension - IV labetalol as needed for now -Continue lisinopril, amlodipine  Severe protein calorie malnutrition, prealbumin is 9.1 -wean down tube feeds as diet is advanced  Chronic back pain -Unable to use NSAIDs any longer -Was on hydrocodone as needed prior to admission, and lidocaine  History of AAA -outpatient vascular surgery for follow-up.  Incidental finding -CT abdomen pelvis noted liver and kidney lesions suspected to be benign, consider MRI down the road to reassess this   Family Communication/Anticipated D/C date and plan/Code Status   DVT prophylaxis: heparinSQ Code Status: Full Code.  Family communication: No family at bedside, called and updated daughter Disposition Plan: Status is: Inpatient  Remains inpatient appropriate because:Inpatient level of care  appropriate due to severity of illness   Dispo: The patient is from: Home              Anticipated d/c is to: SNF              Anticipated d/c date is: 1 to 2 days              Patient currently is not medically stable to d/c.    Consultants:    General surgery  Procedures: Exploratory laparotomy, Phillip Heal patch repair of perforated duodenal ulcer, placement of feeding jejunostomy tube, Dr. Zenia Resides 1/17   Subjective:   -Feels okay, no events overnight, some diarrhea due to tube  Objective:    Vitals:   01/01/21 0408 01/01/21 0650 01/01/21 0747 01/01/21 0913  BP: 126/70  124/74 129/77  Pulse: 96   90  Resp: 18  18   Temp: 98.4 F (36.9 C)  98.3 F (36.8 C)   TempSrc: Oral  Oral   SpO2: 94%  93%   Weight:  55.4 kg    Height:        Intake/Output Summary (Last 24 hours) at 01/01/2021 1015 Last data filed at 12/31/2020 1114 Gross per 24 hour  Intake 1174.67 ml  Output --  Net 1174.67 ml   Filed Weights   12/30/20 0400 12/31/20 0618 01/01/21 0650  Weight: 55.9 kg 54.7 kg 55.4 kg    Exam   General no distress HEENT: No JVD CVS: S1-S2, regular rate rhythm  lungs: Decreased breath sounds the bases Abdomen: Soft, no tenderness today, G-tube with tube feeds and JP drain with output Extremities: No edema skin: No rashes on exposed skin   Data Reviewed:   I have personally reviewed following labs and imaging studies:  Labs:  Labs show the following:   Basic Metabolic Panel: Recent Labs  Lab 12/25/20 2039 12/26/20 0040 12/27/20 0048 12/28/20 0035 12/29/20 0044 12/30/20 0133 12/30/20 0650 12/31/20 0120  NA  --  139 136 138 137 137  --  139  K  --  5.1 4.0 3.8 3.8 3.3*  --  4.5  CL  --  108 107 108 104 104  --  107  CO2  --  20* 18* 20* 23 23  --  22  GLUCOSE  --  141* 85 95 130* 156*  --  142*  BUN  --  32* 29* 27* 19 17  --  20  CREATININE  --  1.82* 1.73* 1.48* 1.14* 1.08*  --  1.06*  CALCIUM  --  7.3* 7.2* 7.6* 8.2* 8.0*  --  8.0*  MG 1.7 1.7  --    --   --   --  1.6*  --   PHOS 3.2  --   --   --   --   --   --   --    GFR Estimated Creatinine Clearance: 31.5 mL/min (A) (by C-G formula based on SCr of 1.06 mg/dL (H)). Liver Function Tests: Recent Labs  Lab 12/25/20 1250 12/26/20 0040 12/27/20 0048  AST 16 23 15   ALT 10 13 11   ALKPHOS 47 31* 33*  BILITOT 0.9 0.8 1.2  PROT 6.4* 5.6* 5.2*  ALBUMIN 3.6 3.3* 2.7*   No results for input(s): LIPASE, AMYLASE in the last 168 hours. No results for input(s): AMMONIA in the last 168 hours. Coagulation profile No results for input(s): INR, PROTIME in the last 168 hours.  CBC: Recent Labs  Lab 12/25/20 1250 12/25/20 1255 12/27/20 0048 12/28/20 0035 12/29/20 0044 12/30/20 0133 12/31/20 0120  WBC 16.2*   < > 13.9* 12.0* 9.9 6.5 8.1  NEUTROABS 14.3*  --   --   --   --   --   --   HGB 12.7   < > 9.5* 9.5* 10.8* 10.1* 10.3*  HCT 39.1   < > 29.7* 30.1* 32.8* 30.2* 31.2*  MCV 96.3   < > 94.3 93.8 91.4 90.7 92.3  PLT 220   < > 145* 166 204 195 220   < > = values in this interval not displayed.   Cardiac Enzymes: No results for input(s): CKTOTAL, CKMB, CKMBINDEX, TROPONINI in the last 168 hours. BNP (last 3 results) No results for input(s): PROBNP in the last 8760 hours. CBG: Recent Labs  Lab 12/31/20 0735 12/31/20 1956 12/31/20 2359 01/01/21 0407 01/01/21 0742  GLUCAP 140* 97 114* 124* 121*   D-Dimer: No results for input(s): DDIMER in the last 72 hours. Hgb A1c: No results for input(s): HGBA1C in the last 72 hours. Lipid Profile: No results for input(s): CHOL, HDL, LDLCALC, TRIG, CHOLHDL, LDLDIRECT in the last 72 hours. Thyroid function studies: No results for input(s): TSH, T4TOTAL, T3FREE, THYROIDAB in the last 72 hours.  Invalid input(s): FREET3 Anemia work up: No results for input(s): VITAMINB12, FOLATE, FERRITIN, TIBC, IRON, RETICCTPCT in the last 72 hours. Sepsis Labs: Recent Labs  Lab 12/25/20 1240 12/25/20 1250 12/25/20 2039 12/26/20 0040  12/28/20 0035 12/29/20 0044 12/30/20 0133 12/31/20 0120  WBC  --    < >  --    < > 12.0* 9.9 6.5 8.1  LATICACIDVEN 1.7  --  1.3  --   --   --   --   --    < > = values  in this interval not displayed.    Microbiology Recent Results (from the past 240 hour(s))  SARS Coronavirus 2 by RT PCR (hospital order, performed in [place] hospital lab)     Status: None   Collection Time: 12/25/20  3:12 PM  Result Value Ref Range Status   SARS Coronavirus 2 NEGATIVE NEGATIVE Final    Comment: (NOTE) SARS-CoV-2 target nucleic acids are NOT DETECTED.  The SARS-CoV-2 RNA is generally detectable in upper and lower respiratory specimens during the acute phase of infection. The lowest concentration of SARS-CoV-2 viral copies this assay can detect is 250 copies / mL. A negative result does not preclude SARS-CoV-2 infection and should not be used as the sole basis for treatment or other patient management decisions.  A negative result may occur with improper specimen collection / handling, submission of specimen other than nasopharyngeal swab, presence of viral mutation(s) within the areas targeted by this assay, and inadequate number of viral copies (<250 copies / mL). A negative result must be combined with clinical observations, patient history, and epidemiological information.  Fact Sheet for Patients:   StrictlyIdeas.no  Fact Sheet for Healthcare Providers: BankingDealers.co.za  This test is not yet approved or  cleared by the [place] FDA and has been authorized for detection and/or diagnosis of SARS-CoV-2 by FDA under an Emergency Use Authorization (EUA).  This EUA will remain in effect (meaning this test can be used) for the duration of the COVID-19 declaration under Section 564(b)(1) of the Act, 21 U.S.C. section 360bbb-3(b)(1), unless the authorization is terminated or revoked sooner.  Performed at [facility], [place]  [address] [place], [place] [address]     Procedures and diagnostic studies:  ECHOCARDIOGRAM COMPLETE  Result Date: 12/31/2020    ECHOCARDIOGRAM REPORT   Patient Name:   [name] Date of Exam: 12/31/2020 Medical Rec #:  [id number]           Height:       63.0 in Accession #:    [id number]          Weight:       120.6 lb Date of Birth:  1932/10/02           BSA:          1.560 m Patient Age:    38 years            BP:           145/87 mmHg Patient Gender: F                   HR:           103 bpm. Exam Location:  Inpatient Procedure: 2D Echo Indications:    Abnormal ECG R94.31  History:        Patient has no prior history of Echocardiogram examinations.                 Risk Factors:Former Smoker, Dyslipidemia and Hypertension.                 Peritonitis.  Sonographer:    Leavy Cella Referring Phys: [place]  1. Left ventricular ejection fraction, by estimation, is 60 to 65%. The left ventricle has normal function. The left ventricle has no regional wall motion abnormalities. There is moderate concentric left ventricular hypertrophy and severe basal septal hypertrophy.  2. Right ventricular systolic function is normal. The right ventricular size is normal. Tricuspid regurgitation signal is inadequate for  assessing PA pressure.  3. The mitral valve is normal in structure. No evidence of mitral valve regurgitation. No evidence of mitral stenosis.  4. The aortic valve is normal in structure. Aortic valve regurgitation is not visualized. No aortic stenosis is present. FINDINGS  Left Ventricle: Left ventricular ejection fraction, by estimation, is 60 to 65%. The left ventricle has normal function. The left ventricle has no regional wall motion abnormalities. The left ventricular internal cavity size was normal in size. There is  moderate concentric left ventricular hypertrophy. Left ventricular diastolic parameters are consistent with Grade I diastolic dysfunction (impaired  relaxation). Normal left ventricular filling pressure. Right Ventricle: The right ventricular size is normal. No increase in right ventricular wall thickness. Right ventricular systolic function is normal. Tricuspid regurgitation signal is inadequate for assessing PA pressure. Left Atrium: Left atrial size was normal in size. Right Atrium: Right atrial size was normal in size. Pericardium: There is no evidence of pericardial effusion. Mitral Valve: The mitral valve is normal in structure. There is mild calcification of the anterior mitral valve leaflet(s). Mild mitral annular calcification. No evidence of mitral valve regurgitation. No evidence of mitral valve stenosis. Tricuspid Valve: The tricuspid valve is not well visualized. Tricuspid valve regurgitation is trivial. No evidence of tricuspid stenosis. Aortic Valve: The aortic valve is normal in structure. Aortic valve regurgitation is not visualized. No aortic stenosis is present. Pulmonic Valve: The pulmonic valve was normal in structure. Pulmonic valve regurgitation is not visualized. No evidence of pulmonic stenosis. Aorta: The aortic root is normal in size and structure. IAS/Shunts: No atrial level shunt detected by color flow Doppler.  LEFT VENTRICLE PLAX 2D LVIDd:         3.50 cm  Diastology LVIDs:         2.40 cm  LV e' medial:    6.09 cm/s LV PW:         1.30 cm  LV E/e' medial:  8.3 LV IVS:        1.70 cm  LV e' lateral:   9.79 cm/s LVOT diam:     2.10 cm  LV E/e' lateral: 5.1 LVOT Area:     3.46 cm  RIGHT VENTRICLE RV S prime:     18.30 cm/s TAPSE (M-mode): 1.8 cm LEFT ATRIUM           Index       RIGHT ATRIUM           Index LA diam:      2.80 cm 1.80 cm/m  RA Area:     12.00 cm LA Vol (A2C): 46.9 ml 30.07 ml/m RA Volume:   29.40 ml  18.85 ml/m LA Vol (A4C): 52.4 ml 33.60 ml/m   AORTA Ao Root diam: 2.70 cm MITRAL VALVE               TRICUSPID VALVE MV Area (PHT): 1.77 cm    TR Peak grad:   23.2 mmHg MV Decel Time: 428 msec    TR Vmax:         241.00 cm/s MV E velocity: 50.30 cm/s MV A velocity: 84.20 cm/s  SHUNTS MV E/A ratio:  0.60        Systemic Diam: 2.10 cm Fransico Him MD Electronically signed by Fransico Him MD Signature Date/Time: 12/31/2020/12:07:16 PM    Final     Medications:   . (feeding supplement) PROSource Plus  30 mL Oral TID BM  . amLODipine  10 mg Oral Daily  .  feeding supplement  1 Container Oral TID BM  . heparin  5,000 Units Subcutaneous Q8H  . lisinopril  10 mg Oral Daily  . metoprolol tartrate  12.5 mg Oral BID  . pantoprazole  40 mg Oral Q1200  . polycarbophil  625 mg Oral BID  . rosuvastatin  10 mg Oral QPM   Continuous Infusions: . feeding supplement (OSMOLITE 1.2 CAL) 1,000 mL (12/31/20 1828)     LOS: 7 days   Domenic Polite  [facility]  01/01/2021, 10:15 AM

## 2021-01-01 NOTE — Progress Notes (Signed)
Physical Therapy Treatment Patient Details Name: [name] MRN: [id number] DOB: 03/17/1932 Today's Date: 01/01/2021    History of Present Illness Patient is a 87 y/o female who presents with abdominal pain. Found to have perforated duodenal ulcer now s/p exp laparatomy, Graham patch repair and G-tube placement 12/26/2020. PMH includes HTN and colon CA s/p partial colectomy.    PT Comments    Pt supine in bed, upon standing presents with stool incontinence.  Pt required assistance for safety to mobilize with RW.   Pt continues to benefit from rehab in a post acute setting to maximize functional gains before returning home.    Follow Up Recommendations  SNF     Equipment Recommendations  None recommended by PT    Recommendations for Other Services OT consult     Precautions / Restrictions Precautions Precautions: Other (comment);Fall Precaution Comments: JP drain, PEG tube Required Braces or Orthoses: Other Brace Other Brace: abdominal binder    Mobility  Bed Mobility Overal bed mobility: Needs Assistance Bed Mobility: Supine to Sit;Sit to Supine     Supine to sit: Mod assist Sit to supine: Min assist   General bed mobility comments: Mod assistance for trunk elevation and min assistance back to bed.  Transfers Overall transfer level: Needs assistance Equipment used: Rolling walker (2 wheeled) Transfers: Sit to/from Stand Sit to Stand: Min assist;+2 safety/equipment         General transfer comment: Cues for hand placement to and from seated surface.  Upon standing presents with stool incontinence this session.  Ambulation/Gait Ambulation/Gait assistance: Min guard Gait Distance (Feet): 10 Feet (+ 25 ft) Assistive device: Rolling walker (2 wheeled) Gait Pattern/deviations: Step-through pattern;Decreased step length - right;Decreased step length - left;Antalgic;Shuffle     General Gait Details: Cues for safety and RW position.  Pt refused to leave room  to ambulate in halls and quickly requested back to bed due to fatigue.   Stairs             Wheelchair Mobility    Modified Rankin (Stroke Patients Only)       Balance Overall balance assessment: Needs assistance Sitting-balance support: Feet supported;Bilateral upper extremity supported Sitting balance-Leahy Scale: Fair   Postural control: Posterior lean   Standing balance-Leahy Scale: Poor                              Cognition Arousal/Alertness: Awake/alert Behavior During Therapy: Flat affect;Impulsive Overall Cognitive Status: Impaired/Different from baseline Area of Impairment: Attention;Safety/judgement;Awareness                     Memory: Decreased short-term memory Following Commands: Follows one step commands with increased time;Follows multi-step commands inconsistently Safety/Judgement: Decreased awareness of safety;Decreased awareness of deficits Awareness: Emergent Problem Solving: Slow processing General Comments: Pt impulsive this session.  Poor safety awareness.      Exercises      General Comments        Pertinent Vitals/Pain Pain Assessment: 0-10 Pain Score: 3  Pain Location: abdomen Pain Descriptors / Indicators: Sore;Guarding;Grimacing;Moaning Pain Intervention(s): Monitored during session;Repositioned    Home Living                      Prior Function            PT Goals (current goals can now be found in the care plan section) Acute Rehab PT Goals Patient Stated Goal: to get  better Potential to Achieve Goals: Fair Progress towards PT goals: Progressing toward goals    Frequency    Min 2X/week      PT Plan Current plan remains appropriate    Co-evaluation              AM-PAC PT "6 Clicks" Mobility   Outcome Measure  Help needed turning from your back to your side while in a flat bed without using bedrails?: A Lot Help needed moving from lying on your back to sitting on the  side of a flat bed without using bedrails?: Total Help needed moving to and from a bed to a chair (including a wheelchair)?: A Little Help needed standing up from a chair using your arms (e.g., wheelchair or bedside chair)?: A Little Help needed to walk in hospital room?: A Little Help needed climbing 3-5 steps with a railing? : A Lot 6 Click Score: 14    End of Session Equipment Utilized During Treatment: Other (comment) (abdominal binder.)   Patient left: in bed;with call bell/phone within reach;with chair alarm set Nurse Communication: Mobility status;Patient requests pain meds PT Visit Diagnosis: Pain;Other abnormalities of gait and mobility (R26.89) Pain - part of body:  (abdomen.)     Time: 5462-7035 PT Time Calculation (min) (ACUTE ONLY): 18 min  Charges:  $Therapeutic Activity: 8-22 mins                     Erasmo Leventhal , PTA Acute Rehabilitation Services Pager [phone] Office [phone]     [name] 01/01/2021, 4:47 PM

## 2021-01-01 NOTE — Progress Notes (Signed)
Progress Note  7 Days Post-Op  Subjective: Patient denies much abdominal pain. No nausea this AM and did not vomit overnight. She is having bowel function. She does have some abdominal cramping from time to time. She is asking when she might be able to get out of the hospital.   Objective: Vital signs in last 24 hours: Temp:  [98.1 F (36.7 C)-98.4 F (36.9 C)] 98.3 F (36.8 C) (01/24 0747) Pulse Rate:  [96-99] 96 (01/24 0408) Resp:  [18-20] 18 (01/24 0747) BP: (113-145)/(56-87) 124/74 (01/24 0747) SpO2:  [93 %-94 %] 93 % (01/24 0747) Weight:  [55.4 kg] 55.4 kg (01/24 0650) Last BM Date: 12/31/20  Intake/Output from previous day: 01/23 0701 - 01/24 0700 In: 1174.7 [NG/GT:1174.7] Out: 0  Intake/Output this shift: No intake/output data recorded.  PE: General: alert, NAD Heart: RRR Lungs: CTAB, no wheezes, rhonchi, or rales noted. Respiratory effort nonlabored Abd: soft, nontender, ND, +BS,drain in RLQ withscant SSfluid in bulb, Jtubefeeds running at 50 cc/h, midline incision c/d/i with staples intact and no erythema or drainage   Lab Results:  Recent Labs    12/30/20 0133 12/31/20 0120  WBC 6.5 8.1  HGB 10.1* 10.3*  HCT 30.2* 31.2*  PLT 195 220   BMET Recent Labs    12/30/20 0133 12/31/20 0120  NA 137 139  K 3.3* 4.5  CL 104 107  CO2 23 22  GLUCOSE 156* 142*  BUN 17 20  CREATININE 1.08* 1.06*  CALCIUM 8.0* 8.0*   PT/INR No results for input(s): LABPROT, INR in the last 72 hours. CMP     Component Value Date/Time   NA 139 12/31/2020 0120   K 4.5 12/31/2020 0120   CL 107 12/31/2020 0120   CO2 22 12/31/2020 0120   GLUCOSE 142 (H) 12/31/2020 0120   BUN 20 12/31/2020 0120   CREATININE 1.06 (H) 12/31/2020 0120   CALCIUM 8.0 (L) 12/31/2020 0120   PROT 5.2 (L) 12/27/2020 0048   ALBUMIN 2.7 (L) 12/27/2020 0048   AST 15 12/27/2020 0048   ALT 11 12/27/2020 0048   ALKPHOS 33 (L) 12/27/2020 0048   BILITOT 1.2 12/27/2020 0048   GFRNONAA 51 (L)  12/31/2020 0120   GFRAA >90 08/01/2012 0436   Lipase  No results found for: LIPASE     Studies/Results: ECHOCARDIOGRAM COMPLETE  Result Date: 12/31/2020    ECHOCARDIOGRAM REPORT   Patient Name:   [name] Date of Exam: 12/31/2020 Medical Rec #:  [id number]           Height:       63.0 in Accession #:    [id number]          Weight:       120.6 lb Date of Birth:  1932/07/26           BSA:          1.560 m Patient Age:    87 years            BP:           145/87 mmHg Patient Gender: F                   HR:           103 bpm. Exam Location:  Inpatient Procedure: 2D Echo Indications:    Abnormal ECG R94.31  History:        Patient has no prior history of Echocardiogram examinations.  Risk Factors:Former Smoker, Dyslipidemia and Hypertension.                 Peritonitis.  Sonographer:    Leavy Cella Referring Phys: [place]  1. Left ventricular ejection fraction, by estimation, is 60 to 65%. The left ventricle has normal function. The left ventricle has no regional wall motion abnormalities. There is moderate concentric left ventricular hypertrophy and severe basal septal hypertrophy.  2. Right ventricular systolic function is normal. The right ventricular size is normal. Tricuspid regurgitation signal is inadequate for assessing PA pressure.  3. The mitral valve is normal in structure. No evidence of mitral valve regurgitation. No evidence of mitral stenosis.  4. The aortic valve is normal in structure. Aortic valve regurgitation is not visualized. No aortic stenosis is present. FINDINGS  Left Ventricle: Left ventricular ejection fraction, by estimation, is 60 to 65%. The left ventricle has normal function. The left ventricle has no regional wall motion abnormalities. The left ventricular internal cavity size was normal in size. There is  moderate concentric left ventricular hypertrophy. Left ventricular diastolic parameters are consistent with Grade I  diastolic dysfunction (impaired relaxation). Normal left ventricular filling pressure. Right Ventricle: The right ventricular size is normal. No increase in right ventricular wall thickness. Right ventricular systolic function is normal. Tricuspid regurgitation signal is inadequate for assessing PA pressure. Left Atrium: Left atrial size was normal in size. Right Atrium: Right atrial size was normal in size. Pericardium: There is no evidence of pericardial effusion. Mitral Valve: The mitral valve is normal in structure. There is mild calcification of the anterior mitral valve leaflet(s). Mild mitral annular calcification. No evidence of mitral valve regurgitation. No evidence of mitral valve stenosis. Tricuspid Valve: The tricuspid valve is not well visualized. Tricuspid valve regurgitation is trivial. No evidence of tricuspid stenosis. Aortic Valve: The aortic valve is normal in structure. Aortic valve regurgitation is not visualized. No aortic stenosis is present. Pulmonic Valve: The pulmonic valve was normal in structure. Pulmonic valve regurgitation is not visualized. No evidence of pulmonic stenosis. Aorta: The aortic root is normal in size and structure. IAS/Shunts: No atrial level shunt detected by color flow Doppler.  LEFT VENTRICLE PLAX 2D LVIDd:         3.50 cm  Diastology LVIDs:         2.40 cm  LV e' medial:    6.09 cm/s LV PW:         1.30 cm  LV E/e' medial:  8.3 LV IVS:        1.70 cm  LV e' lateral:   9.79 cm/s LVOT diam:     2.10 cm  LV E/e' lateral: 5.1 LVOT Area:     3.46 cm  RIGHT VENTRICLE RV S prime:     18.30 cm/s TAPSE (M-mode): 1.8 cm LEFT ATRIUM           Index       RIGHT ATRIUM           Index LA diam:      2.80 cm 1.80 cm/m  RA Area:     12.00 cm LA Vol (A2C): 46.9 ml 30.07 ml/m RA Volume:   29.40 ml  18.85 ml/m LA Vol (A4C): 52.4 ml 33.60 ml/m   AORTA Ao Root diam: 2.70 cm MITRAL VALVE               TRICUSPID VALVE MV Area (PHT): 1.77 cm    TR Peak grad:  23.2 mmHg MV Decel  Time: 428 msec    TR Vmax:        241.00 cm/s MV E velocity: 50.30 cm/s MV A velocity: 84.20 cm/s  SHUNTS MV E/A ratio:  0.60        Systemic Diam: 2.10 cm Fransico Him MD Electronically signed by Fransico Him MD Signature Date/Time: 12/31/2020/12:07:16 PM    Final     Anti-infectives: Anti-infectives (From admission, onward)   Start     Dose/Rate Route Frequency Ordered Stop   12/28/20 1400  piperacillin-tazobactam (ZOSYN) IVPB 3.375 g        3.375 g 12.5 mL/hr over 240 Minutes Intravenous Every 8 hours 12/28/20 0652 12/31/20 0535   12/26/20 2200  piperacillin-tazobactam (ZOSYN) IVPB 2.25 g  Status:  Discontinued        2.25 g 100 mL/hr over 30 Minutes Intravenous Every 8 hours 12/26/20 1530 12/28/20 0652   12/26/20 1700  vancomycin (VANCOREADY) IVPB 500 mg/100 mL  Status:  Discontinued        500 mg 100 mL/hr over 60 Minutes Intravenous Every 24 hours 12/26/20 1129 12/27/20 1038   12/25/20 2200  piperacillin-tazobactam (ZOSYN) IVPB 3.375 g  Status:  Discontinued        3.375 g 12.5 mL/hr over 240 Minutes Intravenous Every 8 hours 12/25/20 1517 12/26/20 1530   12/25/20 1530  vancomycin (VANCOREADY) IVPB 1250 mg/250 mL        1,250 mg 166.7 mL/hr over 90 Minutes Intravenous  Once 12/25/20 1517 12/25/20 1758   12/25/20 1530  fluconazole (DIFLUCAN) IVPB 200 mg        200 mg 100 mL/hr over 60 Minutes Intravenous Every 24 hours 12/25/20 1517 12/30/20 1831   12/25/20 1516  vancomycin variable dose per unstable renal function (pharmacist dosing)  Status:  Discontinued         Does not apply See admin instructions 12/25/20 1517 12/27/20 1038   12/25/20 1430  piperacillin-tazobactam (ZOSYN) IVPB 3.375 g        3.375 g 100 mL/hr over 30 Minutes Intravenous  Once 12/25/20 1427 12/25/20 1612       Assessment/Plan Hx of colon cancer - s/p resection and colostomy takedown HTN HLD Chronic pain AKI - Cr 1.06 yesterdayfrom baseline of 0.69, improving but continue to monitor Peripheral  vascular disease AAA - infrarenal 4.3 cm- f/u per radiology guidelines Osteoporosis ABL anemia - hgb10.3 yesterday, stable Severe protein calorie malnutrition - prealbumin 9.1 (1/19), recheck today  Pneumoperitoneum with duodenal perforation S/p exploratory laparotomy, Phillip Heal patch, placement of jejunostomy 12/25/20 Dr. Zenia Resides - POD#7 -UGI negative for leak 1/21 - H. Pylorinegative - JP with0cc output last 24hr - likely remove today  - completed 5 days abx - Continue J-tube feedings until reliably tolerating diet - advance to FLD today - can go to SNF on FLD and TF and advance diet slowly over the next week from a surgical standpoint, will work on arranging follow up in our office - staples could be removed at SNF on 01/08/21  FEN:FLD, breeze VTE: SQ heparin ID: Zosyn/vanc 1/17>1/23 Foley: removed 1/18 Follow up: Dr. Zenia Resides  LOS: 7 days    Norm Parcel , [facility] 01/01/2021, 7:47 AM Please see Amion for pager number during day hours 7:00am-4:30pm

## 2021-01-01 NOTE — Plan of Care (Signed)

## 2021-01-01 NOTE — Discharge Instructions (Signed)
NO CRUSHED MEDS THROUGH JEJUNOSTOMY TUBE  [facility]      [facility], [place] [phone]  OPEN ABDOMINAL SURGERY: POST OP INSTRUCTIONS  Always review your discharge instruction sheet given to you by the facility where your surgery was performed.  IF YOU HAVE DISABILITY OR FAMILY LEAVE FORMS, YOU MUST BRING THEM TO THE OFFICE FOR PROCESSING.  PLEASE DO NOT GIVE THEM TO YOUR DOCTOR.  1. A prescription for pain medication may be given to you upon discharge.  Take your pain medication as prescribed, if needed.  If narcotic pain medicine is not needed, then you may take acetaminophen (Tylenol) or ibuprofen (Advil) as needed. 2. Take your usually prescribed medications unless otherwise directed. 3. If you need a refill on your pain medication, please contact your pharmacy. They will contact our office to request authorization.  Prescriptions will not be filled after 5pm or on week-ends. 4. You should follow a light diet the first few days after arrival home, such as soup and crackers, pudding, etc.unless your doctor has advised otherwise. A high-fiber, low fat diet can be resumed as tolerated.   Be sure to include lots of fluids daily. Most patients will experience some swelling and bruising on the chest and neck area.  Ice packs will help.  Swelling and bruising can take several days to resolve 5. Most patients will experience some swelling and bruising in the area of the incision. Ice pack will help. Swelling and bruising can take several days to resolve..  6. It is common to experience some constipation if taking pain medication after surgery.  Increasing fluid intake and taking a stool softener will usually help or prevent this problem from occurring.  A mild laxative (Milk of Magnesia or Miralax) should be taken according to package directions if there are no bowel movements after 48 hours. 7.  You may have steri-strips (small skin tapes) in place directly over the incision.  These strips should  be left on the skin for 7-10 days.  If your surgeon used skin glue on the incision, you may shower in 24 hours.  The glue will flake off over the next 2-3 weeks.  Any sutures or staples will be removed at the office during your follow-up visit. You may find that a light gauze bandage over your incision may keep your staples from being rubbed or pulled. You may shower and replace the bandage daily. 8. ACTIVITIES:  You may resume regular (light) daily activities beginning the next day--such as daily self-care, walking, climbing stairs--gradually increasing activities as tolerated.  You may have sexual intercourse when it is comfortable.  Refrain from any heavy lifting or straining until approved by your doctor. a. You may drive when you no longer are taking prescription pain medication, you can comfortably wear a seatbelt, and you can safely maneuver your car and apply brakes  9. You should see your doctor in the office for a follow-up appointment approximately two weeks after your surgery.  Make sure that you call for this appointment within a day or two after you arrive home to insure a convenient appointment time.   WHEN TO CALL YOUR DOCTOR: 1. Fever over 101.0 2. Inability to urinate 3. Nausea and/or vomiting 4. Extreme swelling or bruising 5. Continued bleeding from incision. 6. Increased pain, redness, or drainage from the incision. 7. Difficulty swallowing or breathing 8. Muscle cramping or spasms. 9. Numbness or tingling in hands or feet or around lips.  The clinic staff is available to answer  your questions during regular business hours.  Please don't hesitate to call and ask to speak to one of the nurses if you have concerns.  For further questions, please visit [email or web address]   

## 2021-01-02 DIAGNOSIS — K265 Chronic or unspecified duodenal ulcer with perforation: Secondary | ICD-10-CM | POA: Diagnosis not present

## 2021-01-02 LAB — CBC
HCT: 30.4 % — ABNORMAL LOW (ref 36.0–46.0)
Hemoglobin: 9.5 g/dL — ABNORMAL LOW (ref 12.0–15.0)
MCH: 29.8 pg (ref 26.0–34.0)
MCHC: 31.3 g/dL (ref 30.0–36.0)
MCV: 95.3 fL (ref 80.0–100.0)
Platelets: 259 10*3/uL (ref 150–400)
RBC: 3.19 MIL/uL — ABNORMAL LOW (ref 3.87–5.11)
RDW: 12.9 % (ref 11.5–15.5)
WBC: 10 10*3/uL (ref 4.0–10.5)
nRBC: 0 % (ref 0.0–0.2)

## 2021-01-02 LAB — BASIC METABOLIC PANEL
Anion gap: 9 (ref 5–15)
BUN: 22 mg/dL (ref 8–23)
CO2: 23 mmol/L (ref 22–32)
Calcium: 8.1 mg/dL — ABNORMAL LOW (ref 8.9–10.3)
Chloride: 102 mmol/L (ref 98–111)
Creatinine, Ser: 1.12 mg/dL — ABNORMAL HIGH (ref 0.44–1.00)
GFR, Estimated: 48 mL/min — ABNORMAL LOW (ref >60)
Glucose, Bld: 121 mg/dL — ABNORMAL HIGH (ref 70–99)
Potassium: 4.3 mmol/L (ref 3.5–5.1)
Sodium: 134 mmol/L — ABNORMAL LOW (ref 135–145)

## 2021-01-02 LAB — SARS CORONAVIRUS 2 BY RT PCR (HOSPITAL ORDER, PERFORMED IN ~~LOC~~ HOSPITAL LAB): SARS Coronavirus 2: NEGATIVE

## 2021-01-02 LAB — GLUCOSE, CAPILLARY
Glucose-Capillary: 97 mg/dL (ref 70–99)
Glucose-Capillary: 91 mg/dL (ref 70–99)

## 2021-01-02 MED ORDER — ACETAMINOPHEN 325 MG PO TABS: 650.0000 mg | ORAL_TABLET | Freq: Four times a day (QID) | ORAL | Status: DC | PRN

## 2021-01-02 MED ORDER — METHOCARBAMOL 500 MG PO TABS: 500.0000 mg | ORAL_TABLET | Freq: Three times a day (TID) | ORAL | Status: DC | PRN

## 2021-01-02 MED ORDER — PANTOPRAZOLE SODIUM 40 MG PO TBEC: 40.0000 mg | DELAYED_RELEASE_TABLET | Freq: Every day | ORAL | Status: DC

## 2021-01-02 MED ORDER — ONDANSETRON HCL 4 MG PO TABS
4.0000 mg | ORAL_TABLET | Freq: Three times a day (TID) | ORAL | Status: DC
  Administered 2021-01-02: 4 mg via ORAL
  Filled 2021-01-02: qty 1

## 2021-01-02 MED ORDER — LIDOCAINE 5 % EX PTCH: 1.0000 | MEDICATED_PATCH | Freq: Every day | CUTANEOUS | 0 refills | Status: DC

## 2021-01-02 MED ORDER — ONDANSETRON 4 MG PO TBDP: 4.0000 mg | ORAL_TABLET | Freq: Three times a day (TID) | ORAL | 0 refills | Status: DC | PRN

## 2021-01-02 MED ORDER — AMLODIPINE BESYLATE 10 MG PO TABS: 10.0000 mg | ORAL_TABLET | Freq: Every day | ORAL | Status: DC

## 2021-01-02 MED ORDER — OSMOLITE 1.2 CAL PO LIQD: 720.0000 mL | ORAL | 0 refills | Status: DC

## 2021-01-02 MED ORDER — METOPROLOL TARTRATE 25 MG PO TABS: 12.5000 mg | ORAL_TABLET | Freq: Two times a day (BID) | ORAL | Status: DC

## 2021-01-02 NOTE — Progress Notes (Signed)
Occupational Therapy Treatment Patient Details Name: [name] MRN: [id number] DOB: 02/08/1932 Today's Date: 01/02/2021    History of present illness Patient is a 87 y/o female who presents with abdominal pain. Found to have perforated duodenal ulcer now s/p exp laparatomy, Graham patch repair and G-tube placement 12/26/2020. PMH includes HTN and colon CA s/p partial colectomy.   OT comments  Session limited by nausea this session. Pt reports, "if I get out of bed I am going to throw." Rn just given zofran. Session focus on bed level ADLs. Pt requires set- up assist for UB grooming tasks this session. Pt repositioning self in bed into long sitting with elevated HOB. Pt would continue to benefit from skilled occupational therapy while admitted and after d/c to address the below listed limitations in order to improve overall functional mobility and facilitate independence with BADL participation. DC plan remains appropriate, will follow acutely per POC.    Follow Up Recommendations  SNF;Supervision/Assistance - 24 hour    Equipment Recommendations  3 in 1 bedside commode    Recommendations for Other Services      Precautions / Restrictions Precautions Precautions: Other (comment);Fall Precaution Comments: JP drain, PEG tube Required Braces or Orthoses: Other Brace Other Brace: abdominal binder Restrictions Weight Bearing Restrictions: No       Mobility Bed Mobility               General bed mobility comments: pt declined OOB mobility but able to reposition in bed with HOB elevated  Transfers                 General transfer comment: pt declined OOB mobiilty    Balance                                           ADL either performed or assessed with clinical judgement   ADL Overall ADL's : Needs assistance/impaired     Grooming: Oral care;Wash/dry face;Bed level;Set up Grooming Details (indicate cue type and reason): from sitting  upright in bed                   Toilet Transfer Details (indicate cue type and reason): pt declined OOB transfer d/t nausea           General ADL Comments: pt presents with nausea this session declining OOB mobility but agreeable to bed level ADLS with set- up assist     Vision       Perception     Praxis      Cognition Arousal/Alertness: Awake/alert Behavior During Therapy: Flat affect Overall Cognitive Status: Within Functional Limits for tasks assessed                                 General Comments: overall WFL for simple mobility/ ADL tasks        Exercises     Shoulder Instructions       General Comments      Pertinent Vitals/ Pain       Pain Assessment: Faces Faces Pain Scale: Hurts little more Pain Location: back Pain Descriptors / Indicators: Aching Pain Intervention(s): Monitored during session  Home Living  Prior Functioning/Environment              Frequency  Min 2X/week        Progress Toward Goals  OT Goals(current goals can now be found in the care plan section)  Progress towards OT goals: Progressing toward goals  Acute Rehab OT Goals Patient Stated Goal: to get better OT Goal Formulation: With patient Time For Goal Achievement: 01/10/21 Potential to Achieve Goals: Good  Plan Discharge plan remains appropriate;Frequency remains appropriate    Co-evaluation                 AM-PAC OT "6 Clicks" Daily Activity     Outcome Measure   Help from another person eating meals?: A Little (set-up, liquid diet) Help from another person taking care of personal grooming?: A Little (set- up) Help from another person toileting, which includes using toliet, bedpan, or urinal?: A Lot Help from another person bathing (including washing, rinsing, drying)?: A Lot Help from another person to put on and taking off regular upper body clothing?: A Lot Help  from another person to put on and taking off regular lower body clothing?: A Lot 6 Click Score: 14    End of Session    OT Visit Diagnosis: Unsteadiness on feet (R26.81);Other abnormalities of gait and mobility (R26.89);Muscle weakness (generalized) (M62.81);Other symptoms and signs involving cognitive function;Pain Pain - Right/Left: Left Pain - part of body:  (back)   Activity Tolerance Other (comment) (limited by nausea)   Patient Left in bed;with call bell/phone within reach;with bed alarm set   Nurse Communication Mobility status        Time: 1000-1017 OT Time Calculation (min): 17 min  Charges: OT General Charges $OT Visit: 1 Visit OT Treatments $Self Care/Home Management : 8-22 mins  [name]., COTA/L Acute Rehabilitation Services [phone]    Precious Haws 01/02/2021, 1:15 PM

## 2021-01-02 NOTE — Progress Notes (Signed)
Progress Note  8 Days Post-Op  Subjective: Patient with some nausea this AM. No emesis. Still having bowel function. Denies abdominal pain. She wants to go home.   Objective: Vital signs in last 24 hours: Temp:  [98 F (36.7 C)-98.6 F (37 C)] 98.1 F (36.7 C) (01/25 0755) Pulse Rate:  [68-90] 75 (01/25 0755) Resp:  [18-20] 18 (01/25 0755) BP: (105-131)/(61-86) 124/72 (01/25 0755) SpO2:  [94 %-96 %] 94 % (01/25 0421) Weight:  [53.9 kg-54.3 kg] 54.3 kg (01/25 0600) Last BM Date: 01/01/21  Intake/Output from previous day: 01/24 0701 - 01/25 0700 In: 600 [P.O.:360; NG/GT:240] Out: 300 [Urine:300] Intake/Output this shift: No intake/output data recorded.  PE: General:alert, NAD Heart:RRR Lungs: CTAB, no wheezes, rhonchi, or rales noted. Respiratory effort nonlabored Abd: soft,nontender,ND, +BS,drain site c/d/i, Jtubefeeds stopped for nausea, midline incision c/d/i withstaples intact and no erythema or drainage   Lab Results:  Recent Labs    12/31/20 0120 01/02/21 0132  WBC 8.1 10.0  HGB 10.3* 9.5*  HCT 31.2* 30.4*  PLT 220 259   BMET Recent Labs    12/31/20 0120 01/02/21 0132  NA 139 134*  K 4.5 4.3  CL 107 102  CO2 22 23  GLUCOSE 142* 121*  BUN 20 22  CREATININE 1.06* 1.12*  CALCIUM 8.0* 8.1*   PT/INR No results for input(s): LABPROT, INR in the last 72 hours. CMP     Component Value Date/Time   NA 134 (L) 01/02/2021 0132   K 4.3 01/02/2021 0132   CL 102 01/02/2021 0132   CO2 23 01/02/2021 0132   GLUCOSE 121 (H) 01/02/2021 0132   BUN 22 01/02/2021 0132   CREATININE 1.12 (H) 01/02/2021 0132   CALCIUM 8.1 (L) 01/02/2021 0132   PROT 5.2 (L) 12/27/2020 0048   ALBUMIN 2.7 (L) 12/27/2020 0048   AST 15 12/27/2020 0048   ALT 11 12/27/2020 0048   ALKPHOS 33 (L) 12/27/2020 0048   BILITOT 1.2 12/27/2020 0048   GFRNONAA 48 (L) 01/02/2021 0132   GFRAA >90 08/01/2012 0436   Lipase  No results found for:  LIPASE     Studies/Results: ECHOCARDIOGRAM COMPLETE  Result Date: 12/31/2020    ECHOCARDIOGRAM REPORT   Patient Name:   [name] Date of Exam: 12/31/2020 Medical Rec #:  [id number]           Height:       63.0 in Accession #:    [id number]          Weight:       120.6 lb Date of Birth:  Nov 16, 1932           BSA:          1.560 m Patient Age:    87 years            BP:           145/87 mmHg Patient Gender: F                   HR:           103 bpm. Exam Location:  Inpatient Procedure: 2D Echo Indications:    Abnormal ECG R94.31  History:        Patient has no prior history of Echocardiogram examinations.                 Risk Factors:Former Smoker, Dyslipidemia and Hypertension.  Peritonitis.  Sonographer:    Leavy Cella Referring Phys: [place]  1. Left ventricular ejection fraction, by estimation, is 60 to 65%. The left ventricle has normal function. The left ventricle has no regional wall motion abnormalities. There is moderate concentric left ventricular hypertrophy and severe basal septal hypertrophy.  2. Right ventricular systolic function is normal. The right ventricular size is normal. Tricuspid regurgitation signal is inadequate for assessing PA pressure.  3. The mitral valve is normal in structure. No evidence of mitral valve regurgitation. No evidence of mitral stenosis.  4. The aortic valve is normal in structure. Aortic valve regurgitation is not visualized. No aortic stenosis is present. FINDINGS  Left Ventricle: Left ventricular ejection fraction, by estimation, is 60 to 65%. The left ventricle has normal function. The left ventricle has no regional wall motion abnormalities. The left ventricular internal cavity size was normal in size. There is  moderate concentric left ventricular hypertrophy. Left ventricular diastolic parameters are consistent with Grade I diastolic dysfunction (impaired relaxation). Normal left ventricular filling pressure.  Right Ventricle: The right ventricular size is normal. No increase in right ventricular wall thickness. Right ventricular systolic function is normal. Tricuspid regurgitation signal is inadequate for assessing PA pressure. Left Atrium: Left atrial size was normal in size. Right Atrium: Right atrial size was normal in size. Pericardium: There is no evidence of pericardial effusion. Mitral Valve: The mitral valve is normal in structure. There is mild calcification of the anterior mitral valve leaflet(s). Mild mitral annular calcification. No evidence of mitral valve regurgitation. No evidence of mitral valve stenosis. Tricuspid Valve: The tricuspid valve is not well visualized. Tricuspid valve regurgitation is trivial. No evidence of tricuspid stenosis. Aortic Valve: The aortic valve is normal in structure. Aortic valve regurgitation is not visualized. No aortic stenosis is present. Pulmonic Valve: The pulmonic valve was normal in structure. Pulmonic valve regurgitation is not visualized. No evidence of pulmonic stenosis. Aorta: The aortic root is normal in size and structure. IAS/Shunts: No atrial level shunt detected by color flow Doppler.  LEFT VENTRICLE PLAX 2D LVIDd:         3.50 cm  Diastology LVIDs:         2.40 cm  LV e' medial:    6.09 cm/s LV PW:         1.30 cm  LV E/e' medial:  8.3 LV IVS:        1.70 cm  LV e' lateral:   9.79 cm/s LVOT diam:     2.10 cm  LV E/e' lateral: 5.1 LVOT Area:     3.46 cm  RIGHT VENTRICLE RV S prime:     18.30 cm/s TAPSE (M-mode): 1.8 cm LEFT ATRIUM           Index       RIGHT ATRIUM           Index LA diam:      2.80 cm 1.80 cm/m  RA Area:     12.00 cm LA Vol (A2C): 46.9 ml 30.07 ml/m RA Volume:   29.40 ml  18.85 ml/m LA Vol (A4C): 52.4 ml 33.60 ml/m   AORTA Ao Root diam: 2.70 cm MITRAL VALVE               TRICUSPID VALVE MV Area (PHT): 1.77 cm    TR Peak grad:   23.2 mmHg MV Decel Time: 428 msec    TR Vmax:        241.00 cm/s MV  E velocity: 50.30 cm/s MV A velocity:  84.20 cm/s  SHUNTS MV E/A ratio:  0.60        Systemic Diam: 2.10 cm Fransico Him MD Electronically signed by Fransico Him MD Signature Date/Time: 12/31/2020/12:07:16 PM    Final     Anti-infectives: Anti-infectives (From admission, onward)   Start     Dose/Rate Route Frequency Ordered Stop   12/28/20 1400  piperacillin-tazobactam (ZOSYN) IVPB 3.375 g        3.375 g 12.5 mL/hr over 240 Minutes Intravenous Every 8 hours 12/28/20 0652 12/31/20 0535   12/26/20 2200  piperacillin-tazobactam (ZOSYN) IVPB 2.25 g  Status:  Discontinued        2.25 g 100 mL/hr over 30 Minutes Intravenous Every 8 hours 12/26/20 1530 12/28/20 0652   12/26/20 1700  vancomycin (VANCOREADY) IVPB 500 mg/100 mL  Status:  Discontinued        500 mg 100 mL/hr over 60 Minutes Intravenous Every 24 hours 12/26/20 1129 12/27/20 1038   12/25/20 2200  piperacillin-tazobactam (ZOSYN) IVPB 3.375 g  Status:  Discontinued        3.375 g 12.5 mL/hr over 240 Minutes Intravenous Every 8 hours 12/25/20 1517 12/26/20 1530   12/25/20 1530  vancomycin (VANCOREADY) IVPB 1250 mg/250 mL        1,250 mg 166.7 mL/hr over 90 Minutes Intravenous  Once 12/25/20 1517 12/25/20 1758   12/25/20 1530  fluconazole (DIFLUCAN) IVPB 200 mg        200 mg 100 mL/hr over 60 Minutes Intravenous Every 24 hours 12/25/20 1517 12/30/20 1831   12/25/20 1516  vancomycin variable dose per unstable renal function (pharmacist dosing)  Status:  Discontinued         Does not apply See admin instructions 12/25/20 1517 12/27/20 1038   12/25/20 1430  piperacillin-tazobactam (ZOSYN) IVPB 3.375 g        3.375 g 100 mL/hr over 30 Minutes Intravenous  Once 12/25/20 1427 12/25/20 1612       Assessment/Plan Hx of colon cancer - s/p resection and colostomy takedown HTN HLD Chronic pain AKI - Cr1.7from baseline of 0.69, continue to monitor Peripheral vascular disease AAA - infrarenal 4.3 cm- f/u per radiology guidelines Osteoporosis ABL anemia - hgb9.5,  stable Severe protein calorie malnutrition - prealbumin 9.1>>16.7  Pneumoperitoneum with duodenal perforation S/p exploratory laparotomy, Phillip Heal patch, placement of jejunostomy 12/25/20 Dr. Zenia Resides - POD#8 -UGI negative for leak1/21 - H. Pylorinegative, completed 5 days abx - Continue J-tube feedings, and FLD - will schedule zofran today to help with nausea - can go to SNF on FLD and TF and advance diet slowly over the next week from a surgical standpoint, will work on arranging follow up in our office - staples could be removed at SNF on 01/08/21  FEN:FLD, breeze; TF VTE: SQ heparin ID: Zosyn/vanc 1/17>1/23 Foley: removed 1/18 Follow up: Dr. Zenia Resides  LOS: 8 days    Norm Parcel , [facility] Surgery 01/02/2021, 11:01 AM Please see Amion for pager number during day hours 7:00am-4:30pm

## 2021-01-02 NOTE — Discharge Summary (Signed)
Physician Discharge Summary  [name] FXT:[id number] DOB: 24-May-1932 DOA: 12/25/2020  PCP: Jonathon Jordan, MD  Admit date: 12/25/2020 Discharge date: 01/02/2021  Time spent: 35 minutes  Recommendations for Outpatient Follow-up:  1. PCP in 1 week, please review all imaging studies including incidental findings kidney and liver lesion 2. Advance to soft diet in 1 week and gradually wean off right time tube feeds 3. Please remove staples on 1/31 4. Check CBC, BMP in 1 week 5. General surgery Dr. Zenia Resides in 2 to 3 weeks   Discharge Diagnoses:  Active Problems:   Peritonitis ([place])   Perforated duodenal ulcer ([place])   AKI (acute kidney injury) ([place])   Bowel perforation ([facility]) Nonsustained ventricular tachycardia Chronic back pain History of abdominal aortic aneurysm Incidental findings in the liver and kidney noted on imaging DO NOT RESUSCITATE  Discharge Condition: Stable  Diet recommendation: Full liquid diet during the day and tube feeds at night, gradually advance in full 5 days to soft diet during the day and taper off tube feeds  Filed Weights   01/01/21 0650 01/01/21 1122 01/02/21 0600  Weight: 55.4 kg 53.9 kg 54.3 kg    History of present illness:  [name] is an 87 y.o. female with medical history significant ofHTN, HLD,colon CA status post partial colectomy, presented with severe abdominal pain. Found to have a perforated duodenal ulcer   Hospital Course:   Acute peritonitis/pneumoperitoneum Duodenal perforation -Admitted with severe abdominal pain, imaging noted pneumoperitoneum,  -taken to the OR urgently for ex lap, noted to have a perforated duodenal ulcer underwent Phillip Heal patch repair, placement of jejunostomy tube and JP drain on 1/17 by Dr. Michaelle Birks -Improved and stabilized, Treated with 5-day course of IV antibiotics and antifungals -Started on tube feeds 1/19, minimal output via JP drain  -Upper GI series was negative for a leak,  diet advanced to full liquids -Now on tube feeds overnight, as oral intake remains poor on occasion, gradually advance to soft diet for 5 days and wean off acutely -Needs staples removed on 1/31 at SNF -PT OT eval completed, SNF recommended for rehabilitation, she will be discharged today  AKI -Resolved  Frequent PVCs, nonsustained ventricular tachycardia -Improved, electrolytes repleted, started on low-dose beta-blocker -2D echo with preserved EF moderate LVH  Uncontrolled hypertension -Blood pressure improved, continue amlodipine and metoprolol  Severe protein calorie malnutrition, prealbumin is 9.1 -Currently on full liquid diet during the day tube feeds at night, gradually wean off tube feeds p.o. intake improved  Chronic back pain -Unable to use NSAIDs any longer, continue Robaxin as needed -Was on hydrocodone as needed prior to admission, added lidocaine  History of AAA -outpatient vascular surgery for follow-up.  Incidental finding -CT abdomen pelvis noted liver and kidney lesions suspected to be benign, consider MRI down the road to reassess this  Consultants  General surgery  Procedures: Exploratory laparotomy, Phillip Heal patch repair of perforated duodenal ulcer, placement of feeding jejunostomy tube, Dr. Zenia Resides 1/17  Discharge Exam: Vitals:   01/02/21 0755 01/02/21 1110  BP: 124/72 120/67  Pulse: 75 81  Resp: 18 (!) 22  Temp: 98.1 F (36.7 C) 98.5 F (36.9 C)  SpO2:      General: Awake alert oriented x3 Cardiovascular: S1-S2, regular rate rhythm Respiratory: Decreased breath sounds at the bases, otherwise clear Abdomen: Soft, nontender, surgical incision healing well, JP drain with minimal output and J-tube Extremities: No edema  Discharge Instructions   Discharge Instructions    Discharge instructions  Complete by: As directed    Continue full liquid diet during the day and Osmolite tube feeds for 10 hours at night   No dressing needed    Complete by: As directed      Allergies as of 01/02/2021      Reactions   Nsaids Other (See Comments)   Hx of perforated duodenal ulcer       Medication List    STOP taking these medications   lisinopril-hydrochlorothiazide 20-12.5 MG tablet Commonly known as: ZESTORETIC   tiZANidine 4 MG tablet Commonly known as: ZANAFLEX     TAKE these medications   acetaminophen 325 MG tablet Commonly known as: TYLENOL Take 2 tablets (650 mg total) by mouth every 6 (six) hours as needed for mild pain or headache.   alendronate 70 MG tablet Commonly known as: FOSAMAX Take 70 mg by mouth every Sunday.   amLODipine 10 MG tablet Commonly known as: NORVASC Take 1 tablet (10 mg total) by mouth daily. Start taking on: January 03, 2021   CALCIUM-VITAMIN D PO Take 1 tablet by mouth daily.   feeding supplement (OSMOLITE 1.2 CAL) Liqd Place 720 mLs into feeding tube daily. Run this for 10hours at night only   fluorometholone 0.1 % ophthalmic suspension Commonly known as: FML Place 1 drop into both eyes daily.   lidocaine 5 % Commonly known as: LIDODERM Place 1 patch onto the skin daily. Remove & Discard patch within 12 hours or as directed by MD Start taking on: January 03, 2021   methocarbamol 500 MG tablet Commonly known as: ROBAXIN Take 1 tablet (500 mg total) by mouth every 8 (eight) hours as needed for muscle spasms. What changed:   when to take this  reasons to take this   metoprolol tartrate 25 MG tablet Commonly known as: LOPRESSOR Take 0.5 tablets (12.5 mg total) by mouth 2 (two) times daily.   ondansetron 4 MG disintegrating tablet Commonly known as: Zofran ODT Take 1 tablet (4 mg total) by mouth every 8 (eight) hours as needed for nausea or vomiting.   pantoprazole 40 MG tablet Commonly known as: PROTONIX Take 1 tablet (40 mg total) by mouth daily at 12 noon.   Polyethyl Glycol-Propyl Glycol 0.4-0.3 % Soln Place 1 drop into both eyes 2 (two) times daily as  needed (dry eyes).   rosuvastatin 10 MG tablet Commonly known as: CRESTOR Take 10 mg by mouth every evening.   VITAMIN B-12 CR PO Take 1 tablet by mouth daily.            Discharge Care Instructions  (From admission, onward)         Start     Ordered   01/02/21 0000  No dressing needed        01 /25/22 1131         Allergies  Allergen Reactions  . Nsaids Other (See Comments)    Hx of perforated duodenal ulcer     Follow-up Information    Dwan Bolt, MD. Go on 01/29/2021.   Specialty: General Surgery Why: Follow up appointment scheduled for 2:45 PM. Please arrive 30 min prior to appointment time. Bring photo ID and insurance information with you.  Contact information: [place]. [address] [place] [id number] [phone]        Jonathon Jordan, MD. Schedule an appointment as soon as possible for a visit in 1 week(s).   Specialty: Family Medicine Contact information: [address] [place] [address]  [phone]                The results of significant diagnostics from this hospitalization (including imaging, microbiology, ancillary and laboratory) are listed below for reference.    Significant Diagnostic Studies: CT ABDOMEN PELVIS WO CONTRAST  Result Date: 12/25/2020 CLINICAL DATA:  Acute abdominal pain nonlocalized EXAM: CT ABDOMEN AND PELVIS WITHOUT CONTRAST TECHNIQUE: Multidetector CT imaging of the abdomen and pelvis was performed following the standard protocol without IV contrast. COMPARISON:  CT abdomen pelvis Apr 27, 2012. FINDINGS: Lower chest: Tiny right pleural effusion with bibasilar subsegmental atelectasis. Hepatobiliary: Slightly increased size of the hypoattenuating lesion in the left lobe of the liver which now measures 2.3 cm previously 1.7 cm (series 3 image 22). No significant change in size of the hypoattenuating lesion in the caudate lobe which measures 1.1 cm previously 1.0 cm (series 3 image 25).  Increased size of a lesion in the posterior aspect of the right lobe of the liver which now measures 1.0 cm previously 0.4 cm (series 3, image 31). The gallbladder is unremarkable. No biliary ductal dilatation. Pancreas: There are few pancreatic calcifications otherwise the pancreas is grossly unremarkable. Spleen: Normal in size without focal abnormality. Adrenals/Urinary Tract: Adrenal glands are unremarkable. Hypoattenuating exophytic 1.1 cm left interpolar renal lesion (series 3 image 27) and a hypoattenuating exophytic 1.5 cm left upper pole renal lesion (series 3, image 23). No left renal stones or hydronephrosis. The right kidney is normal, without renal calculi, focal lesion, or hydronephrosis. Bladder is unremarkable. Stomach/Bowel: Stomach is predominantly decompressed limiting evaluation. Though difficult to evaluate completely there appears to be low-density wall thickening with adjacent free fluid and gas along the proximal duodenum. The remainder of the small bowel appears unremarkable. Colonic diverticulosis. Without definite findings of diverticulitis. Small volume of formed stool in the colon. Anastomotic suture in the left hemipelvis. Vascular/Lymphatic: Extensive aortic and branch vessel atherosclerosis. There is infrarenal abdominal aortic aneurysm measuring up to 4.3 cm in maximum axial diameter with a previous maximum diameter of 2.9 cm in 2013. No visualized pathologically enlarged lymph nodes. Reproductive: Status post hysterectomy. No adnexal masses. Other: Pneumoperitoneum with small volume ascites predominant long the right pericolic gutter. Musculoskeletal: Multilevel degenerative changes spine. No acute osseous abnormality. IMPRESSION: 1. Pneumoperitoneum with small volume ascites predominant long the right pericolic gutter. Though difficult to evaluate completely there appears to be low-density wall thickening with adjacent free fluid and gas along the proximal duodenum. Findings are  concerning for duodenal perforation. 2. Infrarenal abdominal aortic aneurysm measuring up to 4.3 cm in maximum axial diameter with a previous maximum diameter of 2.9 cm in 2013. Recommend follow-up every 12 months and vascular consultation. This recommendation follows ACR consensus guidelines: White Paper of the ACR Incidental Findings Committee II on Vascular Findings. J Am Coll Radiol 2013; 10:789-794. 3. Slightly increased size of the hypoattenuating lesion in the left lobe of the liver which now measures 2.3 cm previously 1.7 cm. Increased size of a lesion in the posterior aspect of the right lobe of the liver which now measures 1.0 cm previously 0.4 cm in 2013. Findings are nonspecific, however the rather slow growth over 9 years would indicate a more benign etiology these would more completely evaluated with liver protocol MRI or CT with and without contrast. 4. Hypoattenuating exophytic left renal lesions measuring up to 1.5 cm, which appear relatively homogeneous and favored to represent renal cysts. However these are incompletely evaluated on today's study and new from prior study. Further  evaluation with non-emergent renal ultrasound is recommended, alternately these could be further evaluated on CT or MRI if utilized for evaluation hepatic lesions. 5. Tiny right pleural effusion with bibasilar subsegmental atelectasis. 6. Aortic atherosclerosis. Aortic Atherosclerosis (ICD10-I70.0). These results were called by telephone at the time of interpretation on 12/25/2020 at 2:25 pm to provider [facility] , who verbally acknowledged these results. Electronically Signed   By: Dahlia Bailiff MD   On: 12/25/2020 14:47   DG Chest Portable 1 View  Result Date: 12/25/2020 CLINICAL DATA:  Abdominal pain since this morning EXAM: PORTABLE CHEST 1 VIEW COMPARISON:  Portable exam 1255 hours compared to 03/29/2015 FINDINGS: Normal heart size, mediastinal contours, and pulmonary vascularity. Atherosclerotic  calcification of a tortuous thoracic aorta. Mild chronic accentuation of markings in the RIGHT upper lobe likely reflecting scarring. No acute infiltrate, pleural effusion, or pneumothorax. Bones demineralized. IMPRESSION: Mild RIGHT upper lobe scarring. No acute abnormalities. Aortic Atherosclerosis (ICD10-I70.0). Electronically Signed   By: Lavonia Dana M.D.   On: 12/25/2020 13:05   DG Abd Portable 2 Views  Result Date: 12/25/2020 CLINICAL DATA:  Abdominal pain, history of abdominal aortic aneurysm EXAM: PORTABLE ABDOMEN - 2 VIEW COMPARISON:  06/24/2012 Aortic ultrasound 01/30/2018 FINDINGS: Lung bases clear. Nonobstructive bowel gas pattern. No bowel dilatation, bowel wall thickening, or free air. Bones demineralized with multilevel degenerative disc/facet disease changes of thoracolumbar spine and levoconvex scoliosis. Curvilinear LEFT paraspinal calcifications consistent with history of known abdominal aortic aneurysm, though size of aneurysm cannot be assessed by this exam. IMPRESSION: Normal bowel gas pattern. Curvilinear atherosclerotic calcifications at known abdominal aortic aneurysm, though size of aneurysm cannot be assessed radiographically. Aneurysm measured 3.3 cm diameter on an ultrasound exam from 2019 If there is clinical concern for complication of aortic aneurysm or acute enlargement, consider CT. Electronically Signed   By: Lavonia Dana M.D.   On: 12/25/2020 13:11   DG UGI W SINGLE CM (SOL OR THIN BA)  Result Date: 12/29/2020 CLINICAL DATA:  Status post Phillip Heal patch repair of perforated ulcer in the duodenal bulb. Contrast 50 cc Omnipaque 300 EXAM: WATER SOLUBLE UPPER GI SERIES TECHNIQUE: Single-column upper GI series was performed using water soluble contrast. CONTRAST:  50 cc Omnipaque 300 COMPARISON:  CT scan 12/25/2020. FLUOROSCOPY TIME:  Fluoroscopy Time:  1 minutes and 30 seconds. Radiation Exposure Index (if provided by the fluoroscopic device): 67.2 mGy Number of Acquired Spot  Images: FINDINGS: Pre-procedure KUB shows diffuse gaseous distention of small bowel and colon compatible with underlying component of ileus. Presumed jejunostomy tube over the left abdomen. JP drain identified over the right abdomen with the tip positioned in the region of the pylorus/duodenal bulb. Patient was placed RPO relative to the fluoro table and given sips of water soluble contrast material. Contrast flowed readily into the stomach with prompt gastric emptying into the duodenum. With additional sips of contrast, there is continued gastric emptying to the level of the transverse duodenum. Deformation of the duodenal bulb is consistent with the recent surgery. Patient was subsequently evaluated supine and LPO positions as well. Duodenal contrast material was visualized passing the ligament of Treitz into the proximal jejunum. At no time during the exam was contrast extravasation observed in the region of the duodenal bulb or elsewhere along the distal stomach/duodenum. No opacification of the adjacent surgical drain was evident. IMPRESSION: 1. Brisk/prompt gastric emptying with no evidence for contrast extravasation in the region of the pylorus/duodenal bulb. Postoperative changes are evident in the duodenal bulb. No findings to  suggest postoperative leak on today's exam. 2. No gastric, duodenal or proximal jejunal distention. Electronically Signed   By: Misty Stanley M.D.   On: 12/29/2020 08:45   ECHOCARDIOGRAM COMPLETE  Result Date: 12/31/2020    ECHOCARDIOGRAM REPORT   Patient Name:   ORRA NOLDE Date of Exam: 12/31/2020 Medical Rec #:  [id number]           Height:       63.0 in Accession #:    [id number]          Weight:       120.6 lb Date of Birth:  [birth year]           BSA:          1.560 m Patient Age:    39 years            BP:           145/87 mmHg Patient Gender: F                   HR:           103 bpm. Exam Location:  Inpatient Procedure: 2D Echo Indications:    Abnormal ECG R94.31   History:        Patient has no prior history of Echocardiogram examinations.                 Risk Factors:Former Smoker, Dyslipidemia and Hypertension.                 Peritonitis.  Sonographer:    Leavy Cella Referring Phys: [place]  1. Left ventricular ejection fraction, by estimation, is 60 to 65%. The left ventricle has normal function. The left ventricle has no regional wall motion abnormalities. There is moderate concentric left ventricular hypertrophy and severe basal septal hypertrophy.  2. Right ventricular systolic function is normal. The right ventricular size is normal. Tricuspid regurgitation signal is inadequate for assessing PA pressure.  3. The mitral valve is normal in structure. No evidence of mitral valve regurgitation. No evidence of mitral stenosis.  4. The aortic valve is normal in structure. Aortic valve regurgitation is not visualized. No aortic stenosis is present. FINDINGS  Left Ventricle: Left ventricular ejection fraction, by estimation, is 60 to 65%. The left ventricle has normal function. The left ventricle has no regional wall motion abnormalities. The left ventricular internal cavity size was normal in size. There is  moderate concentric left ventricular hypertrophy. Left ventricular diastolic parameters are consistent with Grade I diastolic dysfunction (impaired relaxation). Normal left ventricular filling pressure. Right Ventricle: The right ventricular size is normal. No increase in right ventricular wall thickness. Right ventricular systolic function is normal. Tricuspid regurgitation signal is inadequate for assessing PA pressure. Left Atrium: Left atrial size was normal in size. Right Atrium: Right atrial size was normal in size. Pericardium: There is no evidence of pericardial effusion. Mitral Valve: The mitral valve is normal in structure. There is mild calcification of the anterior mitral valve leaflet(s). Mild mitral annular calcification. No  evidence of mitral valve regurgitation. No evidence of mitral valve stenosis. Tricuspid Valve: The tricuspid valve is not well visualized. Tricuspid valve regurgitation is trivial. No evidence of tricuspid stenosis. Aortic Valve: The aortic valve is normal in structure. Aortic valve regurgitation is not visualized. No aortic stenosis is present. Pulmonic Valve: The pulmonic valve was normal in structure. Pulmonic valve regurgitation is not visualized. No evidence of pulmonic stenosis. Aorta: The aortic root  is normal in size and structure. IAS/Shunts: No atrial level shunt detected by color flow Doppler.  LEFT VENTRICLE PLAX 2D LVIDd:         3.50 cm  Diastology LVIDs:         2.40 cm  LV e' medial:    6.09 cm/s LV PW:         1.30 cm  LV E/e' medial:  8.3 LV IVS:        1.70 cm  LV e' lateral:   9.79 cm/s LVOT diam:     2.10 cm  LV E/e' lateral: 5.1 LVOT Area:     3.46 cm  RIGHT VENTRICLE RV S prime:     18.30 cm/s TAPSE (M-mode): 1.8 cm LEFT ATRIUM           Index       RIGHT ATRIUM           Index LA diam:      2.80 cm 1.80 cm/m  RA Area:     12.00 cm LA Vol (A2C): 46.9 ml 30.07 ml/m RA Volume:   29.40 ml  18.85 ml/m LA Vol (A4C): 52.4 ml 33.60 ml/m   AORTA Ao Root diam: 2.70 cm MITRAL VALVE               TRICUSPID VALVE MV Area (PHT): 1.77 cm    TR Peak grad:   23.2 mmHg MV Decel Time: 428 msec    TR Vmax:        241.00 cm/s MV E velocity: 50.30 cm/s MV A velocity: 84.20 cm/s  SHUNTS MV E/A ratio:  0.60        Systemic Diam: 2.10 cm Fransico Him MD Electronically signed by Fransico Him MD Signature Date/Time: 12/31/2020/12:07:16 PM    Final     Microbiology: Recent Results (from the past 240 hour(s))  SARS Coronavirus 2 by RT PCR (hospital order, performed in [place] hospital lab)     Status: None   Collection Time: 12/25/20  3:12 PM  Result Value Ref Range Status   SARS Coronavirus 2 NEGATIVE NEGATIVE Final    Comment: (NOTE) SARS-CoV-2 target nucleic acids are NOT DETECTED.  The  SARS-CoV-2 RNA is generally detectable in upper and lower respiratory specimens during the acute phase of infection. The lowest concentration of SARS-CoV-2 viral copies this assay can detect is 250 copies / mL. A negative result does not preclude SARS-CoV-2 infection and should not be used as the sole basis for treatment or other patient management decisions.  A negative result may occur with improper specimen collection / handling, submission of specimen other than nasopharyngeal swab, presence of viral mutation(s) within the areas targeted by this assay, and inadequate number of viral copies (<250 copies / mL). A negative result must be combined with clinical observations, patient history, and epidemiological information.  Fact Sheet for Patients:   StrictlyIdeas.no  Fact Sheet for Healthcare Providers: BankingDealers.co.za  This test is not yet approved or  cleared by the [place] FDA and has been authorized for detection and/or diagnosis of SARS-CoV-2 by FDA under an Emergency Use Authorization (EUA).  This EUA will remain in effect (meaning this test can be used) for the duration of the COVID-19 declaration under Section 564(b)(1) of the Act, 21 U.S.C. section 360bbb-3(b)(1), unless the authorization is terminated or revoked sooner.  Performed at [facility], [place] [address], [place], [place] [address]      Labs: Basic Metabolic Panel: Recent Labs  Lab 12/28/20 0035 12/29/20 0044 12/30/20 0133 12/30/20 0650 12/31/20 0120 01/02/21 0132  NA 138 137 137  --  139 134*  K 3.8 3.8 3.3*  --  4.5 4.3  CL 108 104 104  --  107 102  CO2 20* 23 23  --  22 23  GLUCOSE 95 130* 156*  --  142* 121*  BUN 27* 19 17  --  20 22  CREATININE 1.48* 1.14* 1.08*  --  1.06* 1.12*  CALCIUM 7.6* 8.2* 8.0*  --  8.0* 8.1*  MG  --   --   --  1.6*  --   --    Liver Function Tests: Recent Labs  Lab 12/27/20 0048  AST 15  ALT 11   ALKPHOS 33*  BILITOT 1.2  PROT 5.2*  ALBUMIN 2.7*   No results for input(s): LIPASE, AMYLASE in the last 168 hours. No results for input(s): AMMONIA in the last 168 hours. CBC: Recent Labs  Lab 12/28/20 0035 12/29/20 0044 12/30/20 0133 12/31/20 0120 01/02/21 0132  WBC 12.0* 9.9 6.5 8.1 10.0  HGB 9.5* 10.8* 10.1* 10.3* 9.5*  HCT 30.1* 32.8* 30.2* 31.2* 30.4*  MCV 93.8 91.4 90.7 92.3 95.3  PLT 166 204 195 220 259   Cardiac Enzymes: No results for input(s): CKTOTAL, CKMB, CKMBINDEX, TROPONINI in the last 168 hours. BNP: BNP (last 3 results) No results for input(s): BNP in the last 8760 hours.  ProBNP (last 3 results) No results for input(s): PROBNP in the last 8760 hours.  CBG: Recent Labs  Lab 01/01/21 1149 01/01/21 1553 01/01/21 1933 01/02/21 0747 01/02/21 1108  GLUCAP 101* 92 110* 97 91       Signed:  Domenic Polite MD.  [facility] 01/02/2021, 11:32 AM

## 2021-01-02 NOTE — TOC Transition Note (Signed)
Transition of Care [facility]) - CM/SW Discharge Note   Patient Details  Name: [name] MRN: [id number] Date of Birth: 1932-03-27  [facility] CM/SW Contact:  Bethann Berkshire, [place] Phone Number: 01/02/2021, 2:54 PM   Clinical Narrative:     Patient will DC to: [facility] Anticipated DC date: 01/02/21 Family notified: Nash,Linda (Daughter)  [phone] (Mobile) Transport by: Corey Harold   Per MD patient ready for DC to [facility]. RN, patient, patient's family, and facility notified of DC. Discharge Summary and FL2 sent to facility. RN to call report prior to discharge ([phone] Room 218). DC packet on chart. Ambulance transport requested for patient.   CSW will sign off for now as social work intervention is no longer needed. Please consult [place] again if new needs arise.   Final next level of care: Skilled Nursing Facility Barriers to Discharge: No Barriers Identified   Patient Goals and CMS Choice Patient states their goals for this hospitalization and ongoing recovery are:: Eventually to return home but pt is agreeable to SNF CMS Medicare.gov Compare Post Acute Care list provided to:: Patient Choice offered to / list presented to : [place]  Discharge Placement              Patient chooses bed at: [facility] Patient to be transferred to facility by: [facility] Name of family member notified: Nash,Linda (Daughter)   [phone] (Mobile) Patient and family notified of of transfer: 01/02/21  Discharge Plan and Services                                     Social Determinants of Health (SDOH) Interventions     Readmission Risk Interventions No flowsheet data found.

## 2021-01-02 NOTE — Care Management Important Message (Signed)
Important Message  Patient Details  Name: [name] MRN: [id number] Date of Birth: 1932/10/03   Medicare Important Message Given:  Yes     [name] [name] 01/02/2021, 3:05 PM

## 2021-01-02 NOTE — TOC Progression Note (Signed)
Transition of Care [facility]) - Progression Note    Patient Details  Name: [name] MRN: [id number] Date of Birth: 1931-12-14  Transition of Care [facility]) CM/SW [place], [place] Phone Number: 01/02/2021, 2:05 PM  Clinical Narrative:     [facility] cannot accept pt today. CSW called [facility] who can accept pending covid test. Pt's daughter is informed and agreeable. CSW will arrange transport once negative covid test results are received. Auth started Ref# [id number].   Expected Discharge Plan: [place] Barriers to Discharge: Continued Medical Work up  Expected Discharge Plan and Services Expected Discharge Plan: [place] arrangements for the past 2 months: Apartment Expected Discharge Date: 01/02/21                                     Social Determinants of Health (SDOH) Interventions    Readmission Risk Interventions No flowsheet data found.

## 2021-01-03 DIAGNOSIS — R451 Restlessness and agitation: Secondary | ICD-10-CM | POA: Diagnosis not present

## 2021-01-03 DIAGNOSIS — Z743 Need for continuous supervision: Secondary | ICD-10-CM | POA: Diagnosis not present

## 2021-01-03 DIAGNOSIS — R932 Abnormal findings on diagnostic imaging of liver and biliary tract: Secondary | ICD-10-CM | POA: Diagnosis not present

## 2021-01-03 DIAGNOSIS — K631 Perforation of intestine (nontraumatic): Secondary | ICD-10-CM | POA: Diagnosis not present

## 2021-01-03 DIAGNOSIS — K659 Peritonitis, unspecified: Secondary | ICD-10-CM | POA: Diagnosis not present

## 2021-01-03 DIAGNOSIS — M549 Dorsalgia, unspecified: Secondary | ICD-10-CM | POA: Diagnosis not present

## 2021-01-03 DIAGNOSIS — J028 Acute pharyngitis due to other specified organisms: Secondary | ICD-10-CM | POA: Diagnosis not present

## 2021-01-03 DIAGNOSIS — M6281 Muscle weakness (generalized): Secondary | ICD-10-CM | POA: Diagnosis not present

## 2021-01-03 DIAGNOSIS — R93429 Abnormal radiologic findings on diagnostic imaging of unspecified kidney: Secondary | ICD-10-CM | POA: Diagnosis not present

## 2021-01-03 DIAGNOSIS — E43 Unspecified severe protein-calorie malnutrition: Secondary | ICD-10-CM | POA: Diagnosis not present

## 2021-01-03 DIAGNOSIS — R410 Disorientation, unspecified: Secondary | ICD-10-CM | POA: Diagnosis not present

## 2021-01-03 DIAGNOSIS — Z7401 Bed confinement status: Secondary | ICD-10-CM | POA: Diagnosis not present

## 2021-01-03 DIAGNOSIS — Z934 Other artificial openings of gastrointestinal tract status: Secondary | ICD-10-CM | POA: Diagnosis not present

## 2021-01-03 DIAGNOSIS — M2550 Pain in unspecified joint: Secondary | ICD-10-CM | POA: Diagnosis not present

## 2021-01-03 DIAGNOSIS — E785 Hyperlipidemia, unspecified: Secondary | ICD-10-CM | POA: Diagnosis not present

## 2021-01-03 DIAGNOSIS — Z48815 Encounter for surgical aftercare following surgery on the digestive system: Secondary | ICD-10-CM | POA: Diagnosis not present

## 2021-01-03 DIAGNOSIS — I6529 Occlusion and stenosis of unspecified carotid artery: Secondary | ICD-10-CM | POA: Diagnosis not present

## 2021-01-03 DIAGNOSIS — R5381 Other malaise: Secondary | ICD-10-CM | POA: Diagnosis not present

## 2021-01-03 DIAGNOSIS — I10 Essential (primary) hypertension: Secondary | ICD-10-CM | POA: Diagnosis not present

## 2021-01-03 DIAGNOSIS — R070 Pain in throat: Secondary | ICD-10-CM | POA: Diagnosis not present

## 2021-01-03 DIAGNOSIS — R112 Nausea with vomiting, unspecified: Secondary | ICD-10-CM | POA: Diagnosis not present

## 2021-01-03 DIAGNOSIS — K261 Acute duodenal ulcer with perforation: Secondary | ICD-10-CM | POA: Diagnosis not present

## 2021-01-03 DIAGNOSIS — B370 Candidal stomatitis: Secondary | ICD-10-CM | POA: Diagnosis not present

## 2021-01-03 DIAGNOSIS — Z7983 Long term (current) use of bisphosphonates: Secondary | ICD-10-CM | POA: Diagnosis not present

## 2021-01-03 DIAGNOSIS — Z931 Gastrostomy status: Secondary | ICD-10-CM | POA: Diagnosis not present

## 2021-01-03 DIAGNOSIS — Z9119 Patient's noncompliance with other medical treatment and regimen: Secondary | ICD-10-CM | POA: Diagnosis not present

## 2021-01-03 DIAGNOSIS — N179 Acute kidney failure, unspecified: Secondary | ICD-10-CM | POA: Diagnosis not present

## 2021-01-03 DIAGNOSIS — G8929 Other chronic pain: Secondary | ICD-10-CM | POA: Diagnosis not present

## 2021-01-03 DIAGNOSIS — I714 Abdominal aortic aneurysm, without rupture: Secondary | ICD-10-CM | POA: Diagnosis not present

## 2021-01-03 DIAGNOSIS — K265 Chronic or unspecified duodenal ulcer with perforation: Secondary | ICD-10-CM | POA: Diagnosis not present

## 2021-01-03 DIAGNOSIS — Z85038 Personal history of other malignant neoplasm of large intestine: Secondary | ICD-10-CM | POA: Diagnosis not present

## 2021-01-03 DIAGNOSIS — Z20822 Contact with and (suspected) exposure to covid-19: Secondary | ICD-10-CM | POA: Diagnosis not present

## 2021-01-03 DIAGNOSIS — U071 COVID-19: Secondary | ICD-10-CM | POA: Diagnosis not present

## 2021-01-03 DIAGNOSIS — R2689 Other abnormalities of gait and mobility: Secondary | ICD-10-CM | POA: Diagnosis not present

## 2021-01-03 DIAGNOSIS — R1084 Generalized abdominal pain: Secondary | ICD-10-CM | POA: Diagnosis not present

## 2021-01-03 DIAGNOSIS — F801 Expressive language disorder: Secondary | ICD-10-CM | POA: Diagnosis not present

## 2021-01-03 HISTORY — DX: COVID-19: U07.1

## 2021-01-03 NOTE — Progress Notes (Signed)
Pt d/c to [facility] transported by [employer]. BP 122/70 P 77 T 98.1 oral R 19 O2 95% on room air. Pt belongings taken by daughter prior to d/c.

## 2021-01-04 DIAGNOSIS — K265 Chronic or unspecified duodenal ulcer with perforation: Secondary | ICD-10-CM | POA: Diagnosis not present

## 2021-01-04 DIAGNOSIS — M549 Dorsalgia, unspecified: Secondary | ICD-10-CM | POA: Diagnosis not present

## 2021-01-04 DIAGNOSIS — I10 Essential (primary) hypertension: Secondary | ICD-10-CM | POA: Diagnosis not present

## 2021-01-04 DIAGNOSIS — E43 Unspecified severe protein-calorie malnutrition: Secondary | ICD-10-CM | POA: Diagnosis not present

## 2021-01-05 DIAGNOSIS — R410 Disorientation, unspecified: Secondary | ICD-10-CM | POA: Diagnosis not present

## 2021-01-05 DIAGNOSIS — R112 Nausea with vomiting, unspecified: Secondary | ICD-10-CM | POA: Diagnosis not present

## 2021-01-05 DIAGNOSIS — N179 Acute kidney failure, unspecified: Secondary | ICD-10-CM | POA: Diagnosis not present

## 2021-01-05 DIAGNOSIS — R1084 Generalized abdominal pain: Secondary | ICD-10-CM | POA: Diagnosis not present

## 2021-01-05 DIAGNOSIS — K265 Chronic or unspecified duodenal ulcer with perforation: Secondary | ICD-10-CM | POA: Diagnosis not present

## 2021-01-05 DIAGNOSIS — E43 Unspecified severe protein-calorie malnutrition: Secondary | ICD-10-CM | POA: Diagnosis not present

## 2021-01-05 DIAGNOSIS — Z931 Gastrostomy status: Secondary | ICD-10-CM | POA: Diagnosis not present

## 2021-01-08 DIAGNOSIS — R070 Pain in throat: Secondary | ICD-10-CM | POA: Diagnosis not present

## 2021-01-08 DIAGNOSIS — E43 Unspecified severe protein-calorie malnutrition: Secondary | ICD-10-CM | POA: Diagnosis not present

## 2021-01-08 DIAGNOSIS — R112 Nausea with vomiting, unspecified: Secondary | ICD-10-CM | POA: Diagnosis not present

## 2021-01-08 DIAGNOSIS — B370 Candidal stomatitis: Secondary | ICD-10-CM | POA: Diagnosis not present

## 2021-01-08 DIAGNOSIS — R5381 Other malaise: Secondary | ICD-10-CM | POA: Diagnosis not present

## 2021-01-08 DIAGNOSIS — K265 Chronic or unspecified duodenal ulcer with perforation: Secondary | ICD-10-CM | POA: Diagnosis not present

## 2021-01-09 DIAGNOSIS — J028 Acute pharyngitis due to other specified organisms: Secondary | ICD-10-CM | POA: Diagnosis not present

## 2021-01-09 DIAGNOSIS — R451 Restlessness and agitation: Secondary | ICD-10-CM | POA: Diagnosis not present

## 2021-01-09 DIAGNOSIS — I10 Essential (primary) hypertension: Secondary | ICD-10-CM | POA: Diagnosis not present

## 2021-01-09 DIAGNOSIS — R5381 Other malaise: Secondary | ICD-10-CM | POA: Diagnosis not present

## 2021-01-09 DIAGNOSIS — Z9119 Patient's noncompliance with other medical treatment and regimen: Secondary | ICD-10-CM | POA: Diagnosis not present

## 2021-01-09 DIAGNOSIS — U071 COVID-19: Secondary | ICD-10-CM | POA: Diagnosis not present

## 2021-01-18 DIAGNOSIS — K659 Peritonitis, unspecified: Secondary | ICD-10-CM | POA: Diagnosis not present

## 2021-01-18 DIAGNOSIS — M6281 Muscle weakness (generalized): Secondary | ICD-10-CM | POA: Diagnosis not present

## 2021-01-18 DIAGNOSIS — U071 COVID-19: Secondary | ICD-10-CM | POA: Diagnosis not present

## 2021-01-18 DIAGNOSIS — R2689 Other abnormalities of gait and mobility: Secondary | ICD-10-CM | POA: Diagnosis not present

## 2021-01-18 DIAGNOSIS — E43 Unspecified severe protein-calorie malnutrition: Secondary | ICD-10-CM | POA: Diagnosis not present

## 2021-01-18 DIAGNOSIS — I10 Essential (primary) hypertension: Secondary | ICD-10-CM | POA: Diagnosis not present

## 2021-01-18 DIAGNOSIS — E785 Hyperlipidemia, unspecified: Secondary | ICD-10-CM | POA: Diagnosis not present

## 2021-01-18 DIAGNOSIS — G8929 Other chronic pain: Secondary | ICD-10-CM | POA: Diagnosis not present

## 2021-01-21 DIAGNOSIS — Z934 Other artificial openings of gastrointestinal tract status: Secondary | ICD-10-CM | POA: Diagnosis not present

## 2021-01-21 DIAGNOSIS — F801 Expressive language disorder: Secondary | ICD-10-CM | POA: Diagnosis not present

## 2021-01-21 DIAGNOSIS — Z85038 Personal history of other malignant neoplasm of large intestine: Secondary | ICD-10-CM | POA: Diagnosis not present

## 2021-01-21 DIAGNOSIS — I714 Abdominal aortic aneurysm, without rupture: Secondary | ICD-10-CM | POA: Diagnosis not present

## 2021-01-21 DIAGNOSIS — E43 Unspecified severe protein-calorie malnutrition: Secondary | ICD-10-CM | POA: Diagnosis not present

## 2021-01-21 DIAGNOSIS — N179 Acute kidney failure, unspecified: Secondary | ICD-10-CM | POA: Diagnosis not present

## 2021-01-21 DIAGNOSIS — K659 Peritonitis, unspecified: Secondary | ICD-10-CM | POA: Diagnosis not present

## 2021-01-21 DIAGNOSIS — I6529 Occlusion and stenosis of unspecified carotid artery: Secondary | ICD-10-CM | POA: Diagnosis not present

## 2021-01-21 DIAGNOSIS — G8929 Other chronic pain: Secondary | ICD-10-CM | POA: Diagnosis not present

## 2021-01-21 DIAGNOSIS — R93429 Abnormal radiologic findings on diagnostic imaging of unspecified kidney: Secondary | ICD-10-CM | POA: Diagnosis not present

## 2021-01-21 DIAGNOSIS — R932 Abnormal findings on diagnostic imaging of liver and biliary tract: Secondary | ICD-10-CM | POA: Diagnosis not present

## 2021-01-21 DIAGNOSIS — Z48815 Encounter for surgical aftercare following surgery on the digestive system: Secondary | ICD-10-CM | POA: Diagnosis not present

## 2021-01-21 DIAGNOSIS — K261 Acute duodenal ulcer with perforation: Secondary | ICD-10-CM | POA: Diagnosis not present

## 2021-01-21 DIAGNOSIS — Z7983 Long term (current) use of bisphosphonates: Secondary | ICD-10-CM | POA: Diagnosis not present

## 2021-01-21 DIAGNOSIS — I10 Essential (primary) hypertension: Secondary | ICD-10-CM | POA: Diagnosis not present

## 2021-01-24 DIAGNOSIS — Z934 Other artificial openings of gastrointestinal tract status: Secondary | ICD-10-CM | POA: Diagnosis not present

## 2021-01-24 DIAGNOSIS — I10 Essential (primary) hypertension: Secondary | ICD-10-CM | POA: Diagnosis not present

## 2021-01-24 DIAGNOSIS — Z85038 Personal history of other malignant neoplasm of large intestine: Secondary | ICD-10-CM | POA: Diagnosis not present

## 2021-01-24 DIAGNOSIS — I714 Abdominal aortic aneurysm, without rupture: Secondary | ICD-10-CM | POA: Diagnosis not present

## 2021-01-24 DIAGNOSIS — Z48815 Encounter for surgical aftercare following surgery on the digestive system: Secondary | ICD-10-CM | POA: Diagnosis not present

## 2021-01-24 DIAGNOSIS — N179 Acute kidney failure, unspecified: Secondary | ICD-10-CM | POA: Diagnosis not present

## 2021-01-24 DIAGNOSIS — K261 Acute duodenal ulcer with perforation: Secondary | ICD-10-CM | POA: Diagnosis not present

## 2021-01-24 DIAGNOSIS — Z7983 Long term (current) use of bisphosphonates: Secondary | ICD-10-CM | POA: Diagnosis not present

## 2021-01-24 DIAGNOSIS — E43 Unspecified severe protein-calorie malnutrition: Secondary | ICD-10-CM | POA: Diagnosis not present

## 2021-01-24 DIAGNOSIS — R93429 Abnormal radiologic findings on diagnostic imaging of unspecified kidney: Secondary | ICD-10-CM | POA: Diagnosis not present

## 2021-01-24 DIAGNOSIS — R932 Abnormal findings on diagnostic imaging of liver and biliary tract: Secondary | ICD-10-CM | POA: Diagnosis not present

## 2021-01-24 DIAGNOSIS — F801 Expressive language disorder: Secondary | ICD-10-CM | POA: Diagnosis not present

## 2021-01-24 DIAGNOSIS — K659 Peritonitis, unspecified: Secondary | ICD-10-CM | POA: Diagnosis not present

## 2021-01-24 DIAGNOSIS — I6529 Occlusion and stenosis of unspecified carotid artery: Secondary | ICD-10-CM | POA: Diagnosis not present

## 2021-01-24 DIAGNOSIS — G8929 Other chronic pain: Secondary | ICD-10-CM | POA: Diagnosis not present

## 2021-01-25 DIAGNOSIS — Z934 Other artificial openings of gastrointestinal tract status: Secondary | ICD-10-CM | POA: Diagnosis not present

## 2021-01-25 DIAGNOSIS — F801 Expressive language disorder: Secondary | ICD-10-CM | POA: Diagnosis not present

## 2021-01-25 DIAGNOSIS — R932 Abnormal findings on diagnostic imaging of liver and biliary tract: Secondary | ICD-10-CM | POA: Diagnosis not present

## 2021-01-25 DIAGNOSIS — K261 Acute duodenal ulcer with perforation: Secondary | ICD-10-CM | POA: Diagnosis not present

## 2021-01-25 DIAGNOSIS — I6529 Occlusion and stenosis of unspecified carotid artery: Secondary | ICD-10-CM | POA: Diagnosis not present

## 2021-01-25 DIAGNOSIS — K659 Peritonitis, unspecified: Secondary | ICD-10-CM | POA: Diagnosis not present

## 2021-01-25 DIAGNOSIS — N179 Acute kidney failure, unspecified: Secondary | ICD-10-CM | POA: Diagnosis not present

## 2021-01-25 DIAGNOSIS — Z48815 Encounter for surgical aftercare following surgery on the digestive system: Secondary | ICD-10-CM | POA: Diagnosis not present

## 2021-01-25 DIAGNOSIS — Z85038 Personal history of other malignant neoplasm of large intestine: Secondary | ICD-10-CM | POA: Diagnosis not present

## 2021-01-25 DIAGNOSIS — I714 Abdominal aortic aneurysm, without rupture: Secondary | ICD-10-CM | POA: Diagnosis not present

## 2021-01-25 DIAGNOSIS — I10 Essential (primary) hypertension: Secondary | ICD-10-CM | POA: Diagnosis not present

## 2021-01-25 DIAGNOSIS — E43 Unspecified severe protein-calorie malnutrition: Secondary | ICD-10-CM | POA: Diagnosis not present

## 2021-01-25 DIAGNOSIS — R93429 Abnormal radiologic findings on diagnostic imaging of unspecified kidney: Secondary | ICD-10-CM | POA: Diagnosis not present

## 2021-01-25 DIAGNOSIS — G8929 Other chronic pain: Secondary | ICD-10-CM | POA: Diagnosis not present

## 2021-01-25 DIAGNOSIS — Z7983 Long term (current) use of bisphosphonates: Secondary | ICD-10-CM | POA: Diagnosis not present

## 2021-01-31 DIAGNOSIS — I6529 Occlusion and stenosis of unspecified carotid artery: Secondary | ICD-10-CM | POA: Diagnosis not present

## 2021-01-31 DIAGNOSIS — F801 Expressive language disorder: Secondary | ICD-10-CM | POA: Diagnosis not present

## 2021-01-31 DIAGNOSIS — R932 Abnormal findings on diagnostic imaging of liver and biliary tract: Secondary | ICD-10-CM | POA: Diagnosis not present

## 2021-01-31 DIAGNOSIS — Z7983 Long term (current) use of bisphosphonates: Secondary | ICD-10-CM | POA: Diagnosis not present

## 2021-01-31 DIAGNOSIS — Z48815 Encounter for surgical aftercare following surgery on the digestive system: Secondary | ICD-10-CM | POA: Diagnosis not present

## 2021-01-31 DIAGNOSIS — R93429 Abnormal radiologic findings on diagnostic imaging of unspecified kidney: Secondary | ICD-10-CM | POA: Diagnosis not present

## 2021-01-31 DIAGNOSIS — E43 Unspecified severe protein-calorie malnutrition: Secondary | ICD-10-CM | POA: Diagnosis not present

## 2021-01-31 DIAGNOSIS — Z934 Other artificial openings of gastrointestinal tract status: Secondary | ICD-10-CM | POA: Diagnosis not present

## 2021-01-31 DIAGNOSIS — N179 Acute kidney failure, unspecified: Secondary | ICD-10-CM | POA: Diagnosis not present

## 2021-01-31 DIAGNOSIS — Z85038 Personal history of other malignant neoplasm of large intestine: Secondary | ICD-10-CM | POA: Diagnosis not present

## 2021-01-31 DIAGNOSIS — G8929 Other chronic pain: Secondary | ICD-10-CM | POA: Diagnosis not present

## 2021-01-31 DIAGNOSIS — I714 Abdominal aortic aneurysm, without rupture: Secondary | ICD-10-CM | POA: Diagnosis not present

## 2021-01-31 DIAGNOSIS — I10 Essential (primary) hypertension: Secondary | ICD-10-CM | POA: Diagnosis not present

## 2021-01-31 DIAGNOSIS — K659 Peritonitis, unspecified: Secondary | ICD-10-CM | POA: Diagnosis not present

## 2021-01-31 DIAGNOSIS — K261 Acute duodenal ulcer with perforation: Secondary | ICD-10-CM | POA: Diagnosis not present

## 2021-02-01 DIAGNOSIS — Z48815 Encounter for surgical aftercare following surgery on the digestive system: Secondary | ICD-10-CM | POA: Diagnosis not present

## 2021-02-01 DIAGNOSIS — Z934 Other artificial openings of gastrointestinal tract status: Secondary | ICD-10-CM | POA: Diagnosis not present

## 2021-02-01 DIAGNOSIS — R932 Abnormal findings on diagnostic imaging of liver and biliary tract: Secondary | ICD-10-CM | POA: Diagnosis not present

## 2021-02-01 DIAGNOSIS — N179 Acute kidney failure, unspecified: Secondary | ICD-10-CM | POA: Diagnosis not present

## 2021-02-01 DIAGNOSIS — K261 Acute duodenal ulcer with perforation: Secondary | ICD-10-CM | POA: Diagnosis not present

## 2021-02-01 DIAGNOSIS — E43 Unspecified severe protein-calorie malnutrition: Secondary | ICD-10-CM | POA: Diagnosis not present

## 2021-02-01 DIAGNOSIS — R93429 Abnormal radiologic findings on diagnostic imaging of unspecified kidney: Secondary | ICD-10-CM | POA: Diagnosis not present

## 2021-02-01 DIAGNOSIS — I6529 Occlusion and stenosis of unspecified carotid artery: Secondary | ICD-10-CM | POA: Diagnosis not present

## 2021-02-01 DIAGNOSIS — Z85038 Personal history of other malignant neoplasm of large intestine: Secondary | ICD-10-CM | POA: Diagnosis not present

## 2021-02-01 DIAGNOSIS — I10 Essential (primary) hypertension: Secondary | ICD-10-CM | POA: Diagnosis not present

## 2021-02-01 DIAGNOSIS — Z7983 Long term (current) use of bisphosphonates: Secondary | ICD-10-CM | POA: Diagnosis not present

## 2021-02-01 DIAGNOSIS — I714 Abdominal aortic aneurysm, without rupture: Secondary | ICD-10-CM | POA: Diagnosis not present

## 2021-02-01 DIAGNOSIS — F801 Expressive language disorder: Secondary | ICD-10-CM | POA: Diagnosis not present

## 2021-02-01 DIAGNOSIS — K659 Peritonitis, unspecified: Secondary | ICD-10-CM | POA: Diagnosis not present

## 2021-02-01 DIAGNOSIS — G8929 Other chronic pain: Secondary | ICD-10-CM | POA: Diagnosis not present

## 2021-02-02 DIAGNOSIS — Z48815 Encounter for surgical aftercare following surgery on the digestive system: Secondary | ICD-10-CM | POA: Diagnosis not present

## 2021-02-02 DIAGNOSIS — Z85038 Personal history of other malignant neoplasm of large intestine: Secondary | ICD-10-CM | POA: Diagnosis not present

## 2021-02-02 DIAGNOSIS — Z7983 Long term (current) use of bisphosphonates: Secondary | ICD-10-CM | POA: Diagnosis not present

## 2021-02-02 DIAGNOSIS — I10 Essential (primary) hypertension: Secondary | ICD-10-CM | POA: Diagnosis not present

## 2021-02-02 DIAGNOSIS — K261 Acute duodenal ulcer with perforation: Secondary | ICD-10-CM | POA: Diagnosis not present

## 2021-02-02 DIAGNOSIS — K659 Peritonitis, unspecified: Secondary | ICD-10-CM | POA: Diagnosis not present

## 2021-02-02 DIAGNOSIS — E43 Unspecified severe protein-calorie malnutrition: Secondary | ICD-10-CM | POA: Diagnosis not present

## 2021-02-02 DIAGNOSIS — I6529 Occlusion and stenosis of unspecified carotid artery: Secondary | ICD-10-CM | POA: Diagnosis not present

## 2021-02-02 DIAGNOSIS — G8929 Other chronic pain: Secondary | ICD-10-CM | POA: Diagnosis not present

## 2021-02-02 DIAGNOSIS — Z934 Other artificial openings of gastrointestinal tract status: Secondary | ICD-10-CM | POA: Diagnosis not present

## 2021-02-02 DIAGNOSIS — N179 Acute kidney failure, unspecified: Secondary | ICD-10-CM | POA: Diagnosis not present

## 2021-02-02 DIAGNOSIS — F801 Expressive language disorder: Secondary | ICD-10-CM | POA: Diagnosis not present

## 2021-02-02 DIAGNOSIS — R93429 Abnormal radiologic findings on diagnostic imaging of unspecified kidney: Secondary | ICD-10-CM | POA: Diagnosis not present

## 2021-02-02 DIAGNOSIS — R932 Abnormal findings on diagnostic imaging of liver and biliary tract: Secondary | ICD-10-CM | POA: Diagnosis not present

## 2021-02-02 DIAGNOSIS — I714 Abdominal aortic aneurysm, without rupture: Secondary | ICD-10-CM | POA: Diagnosis not present

## 2021-02-05 DIAGNOSIS — E46 Unspecified protein-calorie malnutrition: Secondary | ICD-10-CM | POA: Diagnosis not present

## 2021-02-05 DIAGNOSIS — I10 Essential (primary) hypertension: Secondary | ICD-10-CM | POA: Diagnosis not present

## 2021-02-05 DIAGNOSIS — K279 Peptic ulcer, site unspecified, unspecified as acute or chronic, without hemorrhage or perforation: Secondary | ICD-10-CM | POA: Diagnosis not present

## 2021-02-05 DIAGNOSIS — I471 Supraventricular tachycardia: Secondary | ICD-10-CM | POA: Diagnosis not present

## 2021-02-05 DIAGNOSIS — N1832 Chronic kidney disease, stage 3b: Secondary | ICD-10-CM | POA: Diagnosis not present

## 2021-02-05 DIAGNOSIS — D62 Acute posthemorrhagic anemia: Secondary | ICD-10-CM | POA: Diagnosis not present

## 2021-02-06 DIAGNOSIS — G8929 Other chronic pain: Secondary | ICD-10-CM | POA: Diagnosis not present

## 2021-02-06 DIAGNOSIS — K659 Peritonitis, unspecified: Secondary | ICD-10-CM | POA: Diagnosis not present

## 2021-02-06 DIAGNOSIS — Z48815 Encounter for surgical aftercare following surgery on the digestive system: Secondary | ICD-10-CM | POA: Diagnosis not present

## 2021-02-06 DIAGNOSIS — I6529 Occlusion and stenosis of unspecified carotid artery: Secondary | ICD-10-CM | POA: Diagnosis not present

## 2021-02-06 DIAGNOSIS — I714 Abdominal aortic aneurysm, without rupture: Secondary | ICD-10-CM | POA: Diagnosis not present

## 2021-02-06 DIAGNOSIS — R93429 Abnormal radiologic findings on diagnostic imaging of unspecified kidney: Secondary | ICD-10-CM | POA: Diagnosis not present

## 2021-02-06 DIAGNOSIS — Z934 Other artificial openings of gastrointestinal tract status: Secondary | ICD-10-CM | POA: Diagnosis not present

## 2021-02-06 DIAGNOSIS — K261 Acute duodenal ulcer with perforation: Secondary | ICD-10-CM | POA: Diagnosis not present

## 2021-02-06 DIAGNOSIS — F801 Expressive language disorder: Secondary | ICD-10-CM | POA: Diagnosis not present

## 2021-02-06 DIAGNOSIS — R932 Abnormal findings on diagnostic imaging of liver and biliary tract: Secondary | ICD-10-CM | POA: Diagnosis not present

## 2021-02-06 DIAGNOSIS — Z85038 Personal history of other malignant neoplasm of large intestine: Secondary | ICD-10-CM | POA: Diagnosis not present

## 2021-02-06 DIAGNOSIS — Z7983 Long term (current) use of bisphosphonates: Secondary | ICD-10-CM | POA: Diagnosis not present

## 2021-02-06 DIAGNOSIS — E43 Unspecified severe protein-calorie malnutrition: Secondary | ICD-10-CM | POA: Diagnosis not present

## 2021-02-06 DIAGNOSIS — I10 Essential (primary) hypertension: Secondary | ICD-10-CM | POA: Diagnosis not present

## 2021-02-06 DIAGNOSIS — N179 Acute kidney failure, unspecified: Secondary | ICD-10-CM | POA: Diagnosis not present

## 2021-02-08 DIAGNOSIS — I714 Abdominal aortic aneurysm, without rupture: Secondary | ICD-10-CM | POA: Diagnosis not present

## 2021-02-08 DIAGNOSIS — I10 Essential (primary) hypertension: Secondary | ICD-10-CM | POA: Diagnosis not present

## 2021-02-08 DIAGNOSIS — Z48815 Encounter for surgical aftercare following surgery on the digestive system: Secondary | ICD-10-CM | POA: Diagnosis not present

## 2021-02-08 DIAGNOSIS — F801 Expressive language disorder: Secondary | ICD-10-CM | POA: Diagnosis not present

## 2021-02-08 DIAGNOSIS — G8929 Other chronic pain: Secondary | ICD-10-CM | POA: Diagnosis not present

## 2021-02-08 DIAGNOSIS — N179 Acute kidney failure, unspecified: Secondary | ICD-10-CM | POA: Diagnosis not present

## 2021-02-08 DIAGNOSIS — K659 Peritonitis, unspecified: Secondary | ICD-10-CM | POA: Diagnosis not present

## 2021-02-08 DIAGNOSIS — Z7983 Long term (current) use of bisphosphonates: Secondary | ICD-10-CM | POA: Diagnosis not present

## 2021-02-08 DIAGNOSIS — R93429 Abnormal radiologic findings on diagnostic imaging of unspecified kidney: Secondary | ICD-10-CM | POA: Diagnosis not present

## 2021-02-08 DIAGNOSIS — Z934 Other artificial openings of gastrointestinal tract status: Secondary | ICD-10-CM | POA: Diagnosis not present

## 2021-02-08 DIAGNOSIS — I6529 Occlusion and stenosis of unspecified carotid artery: Secondary | ICD-10-CM | POA: Diagnosis not present

## 2021-02-08 DIAGNOSIS — E43 Unspecified severe protein-calorie malnutrition: Secondary | ICD-10-CM | POA: Diagnosis not present

## 2021-02-08 DIAGNOSIS — Z85038 Personal history of other malignant neoplasm of large intestine: Secondary | ICD-10-CM | POA: Diagnosis not present

## 2021-02-08 DIAGNOSIS — R932 Abnormal findings on diagnostic imaging of liver and biliary tract: Secondary | ICD-10-CM | POA: Diagnosis not present

## 2021-02-08 DIAGNOSIS — K261 Acute duodenal ulcer with perforation: Secondary | ICD-10-CM | POA: Diagnosis not present

## 2021-02-09 DIAGNOSIS — I714 Abdominal aortic aneurysm, without rupture: Secondary | ICD-10-CM | POA: Diagnosis not present

## 2021-02-09 DIAGNOSIS — E43 Unspecified severe protein-calorie malnutrition: Secondary | ICD-10-CM | POA: Diagnosis not present

## 2021-02-09 DIAGNOSIS — N179 Acute kidney failure, unspecified: Secondary | ICD-10-CM | POA: Diagnosis not present

## 2021-02-09 DIAGNOSIS — Z48815 Encounter for surgical aftercare following surgery on the digestive system: Secondary | ICD-10-CM | POA: Diagnosis not present

## 2021-02-09 DIAGNOSIS — R932 Abnormal findings on diagnostic imaging of liver and biliary tract: Secondary | ICD-10-CM | POA: Diagnosis not present

## 2021-02-09 DIAGNOSIS — Z7983 Long term (current) use of bisphosphonates: Secondary | ICD-10-CM | POA: Diagnosis not present

## 2021-02-09 DIAGNOSIS — K659 Peritonitis, unspecified: Secondary | ICD-10-CM | POA: Diagnosis not present

## 2021-02-09 DIAGNOSIS — K261 Acute duodenal ulcer with perforation: Secondary | ICD-10-CM | POA: Diagnosis not present

## 2021-02-09 DIAGNOSIS — Z85038 Personal history of other malignant neoplasm of large intestine: Secondary | ICD-10-CM | POA: Diagnosis not present

## 2021-02-09 DIAGNOSIS — F801 Expressive language disorder: Secondary | ICD-10-CM | POA: Diagnosis not present

## 2021-02-09 DIAGNOSIS — I6529 Occlusion and stenosis of unspecified carotid artery: Secondary | ICD-10-CM | POA: Diagnosis not present

## 2021-02-09 DIAGNOSIS — I10 Essential (primary) hypertension: Secondary | ICD-10-CM | POA: Diagnosis not present

## 2021-02-09 DIAGNOSIS — G8929 Other chronic pain: Secondary | ICD-10-CM | POA: Diagnosis not present

## 2021-02-09 DIAGNOSIS — Z934 Other artificial openings of gastrointestinal tract status: Secondary | ICD-10-CM | POA: Diagnosis not present

## 2021-02-09 DIAGNOSIS — R93429 Abnormal radiologic findings on diagnostic imaging of unspecified kidney: Secondary | ICD-10-CM | POA: Diagnosis not present

## 2021-02-13 DIAGNOSIS — K659 Peritonitis, unspecified: Secondary | ICD-10-CM | POA: Diagnosis not present

## 2021-02-13 DIAGNOSIS — Z85038 Personal history of other malignant neoplasm of large intestine: Secondary | ICD-10-CM | POA: Diagnosis not present

## 2021-02-13 DIAGNOSIS — Z48815 Encounter for surgical aftercare following surgery on the digestive system: Secondary | ICD-10-CM | POA: Diagnosis not present

## 2021-02-13 DIAGNOSIS — I714 Abdominal aortic aneurysm, without rupture: Secondary | ICD-10-CM | POA: Diagnosis not present

## 2021-02-13 DIAGNOSIS — E43 Unspecified severe protein-calorie malnutrition: Secondary | ICD-10-CM | POA: Diagnosis not present

## 2021-02-13 DIAGNOSIS — G8929 Other chronic pain: Secondary | ICD-10-CM | POA: Diagnosis not present

## 2021-02-13 DIAGNOSIS — N179 Acute kidney failure, unspecified: Secondary | ICD-10-CM | POA: Diagnosis not present

## 2021-02-13 DIAGNOSIS — R93429 Abnormal radiologic findings on diagnostic imaging of unspecified kidney: Secondary | ICD-10-CM | POA: Diagnosis not present

## 2021-02-13 DIAGNOSIS — F801 Expressive language disorder: Secondary | ICD-10-CM | POA: Diagnosis not present

## 2021-02-13 DIAGNOSIS — Z934 Other artificial openings of gastrointestinal tract status: Secondary | ICD-10-CM | POA: Diagnosis not present

## 2021-02-13 DIAGNOSIS — Z7983 Long term (current) use of bisphosphonates: Secondary | ICD-10-CM | POA: Diagnosis not present

## 2021-02-13 DIAGNOSIS — I10 Essential (primary) hypertension: Secondary | ICD-10-CM | POA: Diagnosis not present

## 2021-02-13 DIAGNOSIS — R932 Abnormal findings on diagnostic imaging of liver and biliary tract: Secondary | ICD-10-CM | POA: Diagnosis not present

## 2021-02-13 DIAGNOSIS — K261 Acute duodenal ulcer with perforation: Secondary | ICD-10-CM | POA: Diagnosis not present

## 2021-02-13 DIAGNOSIS — I6529 Occlusion and stenosis of unspecified carotid artery: Secondary | ICD-10-CM | POA: Diagnosis not present

## 2021-02-19 DIAGNOSIS — N179 Acute kidney failure, unspecified: Secondary | ICD-10-CM | POA: Diagnosis not present

## 2021-02-19 DIAGNOSIS — Z48815 Encounter for surgical aftercare following surgery on the digestive system: Secondary | ICD-10-CM | POA: Diagnosis not present

## 2021-02-19 DIAGNOSIS — F801 Expressive language disorder: Secondary | ICD-10-CM | POA: Diagnosis not present

## 2021-02-19 DIAGNOSIS — I714 Abdominal aortic aneurysm, without rupture: Secondary | ICD-10-CM | POA: Diagnosis not present

## 2021-02-19 DIAGNOSIS — E43 Unspecified severe protein-calorie malnutrition: Secondary | ICD-10-CM | POA: Diagnosis not present

## 2021-02-19 DIAGNOSIS — G8929 Other chronic pain: Secondary | ICD-10-CM | POA: Diagnosis not present

## 2021-02-19 DIAGNOSIS — R93429 Abnormal radiologic findings on diagnostic imaging of unspecified kidney: Secondary | ICD-10-CM | POA: Diagnosis not present

## 2021-02-19 DIAGNOSIS — R932 Abnormal findings on diagnostic imaging of liver and biliary tract: Secondary | ICD-10-CM | POA: Diagnosis not present

## 2021-02-19 DIAGNOSIS — K261 Acute duodenal ulcer with perforation: Secondary | ICD-10-CM | POA: Diagnosis not present

## 2021-02-19 DIAGNOSIS — Z85038 Personal history of other malignant neoplasm of large intestine: Secondary | ICD-10-CM | POA: Diagnosis not present

## 2021-02-19 DIAGNOSIS — K659 Peritonitis, unspecified: Secondary | ICD-10-CM | POA: Diagnosis not present

## 2021-02-19 DIAGNOSIS — I10 Essential (primary) hypertension: Secondary | ICD-10-CM | POA: Diagnosis not present

## 2021-02-19 DIAGNOSIS — Z7983 Long term (current) use of bisphosphonates: Secondary | ICD-10-CM | POA: Diagnosis not present

## 2021-02-19 DIAGNOSIS — Z934 Other artificial openings of gastrointestinal tract status: Secondary | ICD-10-CM | POA: Diagnosis not present

## 2021-02-19 DIAGNOSIS — I6529 Occlusion and stenosis of unspecified carotid artery: Secondary | ICD-10-CM | POA: Diagnosis not present

## 2021-02-28 DIAGNOSIS — N179 Acute kidney failure, unspecified: Secondary | ICD-10-CM | POA: Diagnosis not present

## 2021-02-28 DIAGNOSIS — K659 Peritonitis, unspecified: Secondary | ICD-10-CM | POA: Diagnosis not present

## 2021-02-28 DIAGNOSIS — R93429 Abnormal radiologic findings on diagnostic imaging of unspecified kidney: Secondary | ICD-10-CM | POA: Diagnosis not present

## 2021-02-28 DIAGNOSIS — Z48815 Encounter for surgical aftercare following surgery on the digestive system: Secondary | ICD-10-CM | POA: Diagnosis not present

## 2021-02-28 DIAGNOSIS — I6529 Occlusion and stenosis of unspecified carotid artery: Secondary | ICD-10-CM | POA: Diagnosis not present

## 2021-02-28 DIAGNOSIS — I10 Essential (primary) hypertension: Secondary | ICD-10-CM | POA: Diagnosis not present

## 2021-02-28 DIAGNOSIS — Z934 Other artificial openings of gastrointestinal tract status: Secondary | ICD-10-CM | POA: Diagnosis not present

## 2021-02-28 DIAGNOSIS — K261 Acute duodenal ulcer with perforation: Secondary | ICD-10-CM | POA: Diagnosis not present

## 2021-02-28 DIAGNOSIS — R932 Abnormal findings on diagnostic imaging of liver and biliary tract: Secondary | ICD-10-CM | POA: Diagnosis not present

## 2021-02-28 DIAGNOSIS — I714 Abdominal aortic aneurysm, without rupture: Secondary | ICD-10-CM | POA: Diagnosis not present

## 2021-02-28 DIAGNOSIS — F801 Expressive language disorder: Secondary | ICD-10-CM | POA: Diagnosis not present

## 2021-02-28 DIAGNOSIS — G8929 Other chronic pain: Secondary | ICD-10-CM | POA: Diagnosis not present

## 2021-02-28 DIAGNOSIS — E43 Unspecified severe protein-calorie malnutrition: Secondary | ICD-10-CM | POA: Diagnosis not present

## 2021-02-28 DIAGNOSIS — Z7983 Long term (current) use of bisphosphonates: Secondary | ICD-10-CM | POA: Diagnosis not present

## 2021-02-28 DIAGNOSIS — Z85038 Personal history of other malignant neoplasm of large intestine: Secondary | ICD-10-CM | POA: Diagnosis not present

## 2021-03-01 DIAGNOSIS — N183 Chronic kidney disease, stage 3 unspecified: Secondary | ICD-10-CM | POA: Diagnosis not present

## 2021-03-01 DIAGNOSIS — Z79899 Other long term (current) drug therapy: Secondary | ICD-10-CM | POA: Diagnosis not present

## 2021-03-01 DIAGNOSIS — E46 Unspecified protein-calorie malnutrition: Secondary | ICD-10-CM | POA: Diagnosis not present

## 2021-03-01 DIAGNOSIS — K279 Peptic ulcer, site unspecified, unspecified as acute or chronic, without hemorrhage or perforation: Secondary | ICD-10-CM | POA: Diagnosis not present

## 2021-03-06 DIAGNOSIS — K261 Acute duodenal ulcer with perforation: Secondary | ICD-10-CM | POA: Diagnosis not present

## 2021-03-06 DIAGNOSIS — Z934 Other artificial openings of gastrointestinal tract status: Secondary | ICD-10-CM | POA: Diagnosis not present

## 2021-03-06 DIAGNOSIS — Z7983 Long term (current) use of bisphosphonates: Secondary | ICD-10-CM | POA: Diagnosis not present

## 2021-03-06 DIAGNOSIS — I714 Abdominal aortic aneurysm, without rupture: Secondary | ICD-10-CM | POA: Diagnosis not present

## 2021-03-06 DIAGNOSIS — Z85038 Personal history of other malignant neoplasm of large intestine: Secondary | ICD-10-CM | POA: Diagnosis not present

## 2021-03-06 DIAGNOSIS — N179 Acute kidney failure, unspecified: Secondary | ICD-10-CM | POA: Diagnosis not present

## 2021-03-06 DIAGNOSIS — K659 Peritonitis, unspecified: Secondary | ICD-10-CM | POA: Diagnosis not present

## 2021-03-06 DIAGNOSIS — Z48815 Encounter for surgical aftercare following surgery on the digestive system: Secondary | ICD-10-CM | POA: Diagnosis not present

## 2021-03-06 DIAGNOSIS — G8929 Other chronic pain: Secondary | ICD-10-CM | POA: Diagnosis not present

## 2021-03-06 DIAGNOSIS — R932 Abnormal findings on diagnostic imaging of liver and biliary tract: Secondary | ICD-10-CM | POA: Diagnosis not present

## 2021-03-06 DIAGNOSIS — E43 Unspecified severe protein-calorie malnutrition: Secondary | ICD-10-CM | POA: Diagnosis not present

## 2021-03-06 DIAGNOSIS — I10 Essential (primary) hypertension: Secondary | ICD-10-CM | POA: Diagnosis not present

## 2021-03-06 DIAGNOSIS — I6529 Occlusion and stenosis of unspecified carotid artery: Secondary | ICD-10-CM | POA: Diagnosis not present

## 2021-03-06 DIAGNOSIS — R93429 Abnormal radiologic findings on diagnostic imaging of unspecified kidney: Secondary | ICD-10-CM | POA: Diagnosis not present

## 2021-03-06 DIAGNOSIS — F801 Expressive language disorder: Secondary | ICD-10-CM | POA: Diagnosis not present

## 2021-03-09 DIAGNOSIS — F801 Expressive language disorder: Secondary | ICD-10-CM | POA: Diagnosis not present

## 2021-03-09 DIAGNOSIS — K659 Peritonitis, unspecified: Secondary | ICD-10-CM | POA: Diagnosis not present

## 2021-03-09 DIAGNOSIS — N179 Acute kidney failure, unspecified: Secondary | ICD-10-CM | POA: Diagnosis not present

## 2021-03-09 DIAGNOSIS — G8929 Other chronic pain: Secondary | ICD-10-CM | POA: Diagnosis not present

## 2021-03-09 DIAGNOSIS — I714 Abdominal aortic aneurysm, without rupture: Secondary | ICD-10-CM | POA: Diagnosis not present

## 2021-03-09 DIAGNOSIS — E43 Unspecified severe protein-calorie malnutrition: Secondary | ICD-10-CM | POA: Diagnosis not present

## 2021-03-09 DIAGNOSIS — R932 Abnormal findings on diagnostic imaging of liver and biliary tract: Secondary | ICD-10-CM | POA: Diagnosis not present

## 2021-03-09 DIAGNOSIS — Z48815 Encounter for surgical aftercare following surgery on the digestive system: Secondary | ICD-10-CM | POA: Diagnosis not present

## 2021-03-09 DIAGNOSIS — I10 Essential (primary) hypertension: Secondary | ICD-10-CM | POA: Diagnosis not present

## 2021-03-09 DIAGNOSIS — R93429 Abnormal radiologic findings on diagnostic imaging of unspecified kidney: Secondary | ICD-10-CM | POA: Diagnosis not present

## 2021-03-09 DIAGNOSIS — Z7983 Long term (current) use of bisphosphonates: Secondary | ICD-10-CM | POA: Diagnosis not present

## 2021-03-09 DIAGNOSIS — Z934 Other artificial openings of gastrointestinal tract status: Secondary | ICD-10-CM | POA: Diagnosis not present

## 2021-03-09 DIAGNOSIS — Z85038 Personal history of other malignant neoplasm of large intestine: Secondary | ICD-10-CM | POA: Diagnosis not present

## 2021-03-09 DIAGNOSIS — I6529 Occlusion and stenosis of unspecified carotid artery: Secondary | ICD-10-CM | POA: Diagnosis not present

## 2021-03-09 DIAGNOSIS — K261 Acute duodenal ulcer with perforation: Secondary | ICD-10-CM | POA: Diagnosis not present

## 2021-03-19 DIAGNOSIS — K659 Peritonitis, unspecified: Secondary | ICD-10-CM | POA: Diagnosis not present

## 2021-03-19 DIAGNOSIS — G8929 Other chronic pain: Secondary | ICD-10-CM | POA: Diagnosis not present

## 2021-03-19 DIAGNOSIS — I10 Essential (primary) hypertension: Secondary | ICD-10-CM | POA: Diagnosis not present

## 2021-03-19 DIAGNOSIS — Z48815 Encounter for surgical aftercare following surgery on the digestive system: Secondary | ICD-10-CM | POA: Diagnosis not present

## 2021-03-19 DIAGNOSIS — E43 Unspecified severe protein-calorie malnutrition: Secondary | ICD-10-CM | POA: Diagnosis not present

## 2021-03-19 DIAGNOSIS — R93429 Abnormal radiologic findings on diagnostic imaging of unspecified kidney: Secondary | ICD-10-CM | POA: Diagnosis not present

## 2021-03-19 DIAGNOSIS — N179 Acute kidney failure, unspecified: Secondary | ICD-10-CM | POA: Diagnosis not present

## 2021-03-19 DIAGNOSIS — I714 Abdominal aortic aneurysm, without rupture: Secondary | ICD-10-CM | POA: Diagnosis not present

## 2021-03-19 DIAGNOSIS — K261 Acute duodenal ulcer with perforation: Secondary | ICD-10-CM | POA: Diagnosis not present

## 2021-03-19 DIAGNOSIS — Z85038 Personal history of other malignant neoplasm of large intestine: Secondary | ICD-10-CM | POA: Diagnosis not present

## 2021-03-19 DIAGNOSIS — R932 Abnormal findings on diagnostic imaging of liver and biliary tract: Secondary | ICD-10-CM | POA: Diagnosis not present

## 2021-03-19 DIAGNOSIS — I6529 Occlusion and stenosis of unspecified carotid artery: Secondary | ICD-10-CM | POA: Diagnosis not present

## 2021-03-19 DIAGNOSIS — Z934 Other artificial openings of gastrointestinal tract status: Secondary | ICD-10-CM | POA: Diagnosis not present

## 2021-03-19 DIAGNOSIS — Z7983 Long term (current) use of bisphosphonates: Secondary | ICD-10-CM | POA: Diagnosis not present

## 2021-03-19 DIAGNOSIS — F801 Expressive language disorder: Secondary | ICD-10-CM | POA: Diagnosis not present

## 2021-03-22 DIAGNOSIS — G8929 Other chronic pain: Secondary | ICD-10-CM | POA: Diagnosis not present

## 2021-03-22 DIAGNOSIS — Z85038 Personal history of other malignant neoplasm of large intestine: Secondary | ICD-10-CM | POA: Diagnosis not present

## 2021-03-22 DIAGNOSIS — E43 Unspecified severe protein-calorie malnutrition: Secondary | ICD-10-CM | POA: Diagnosis not present

## 2021-03-22 DIAGNOSIS — F801 Expressive language disorder: Secondary | ICD-10-CM | POA: Diagnosis not present

## 2021-03-22 DIAGNOSIS — I10 Essential (primary) hypertension: Secondary | ICD-10-CM | POA: Diagnosis not present

## 2021-03-22 DIAGNOSIS — I714 Abdominal aortic aneurysm, without rupture: Secondary | ICD-10-CM | POA: Diagnosis not present

## 2021-03-22 DIAGNOSIS — I6529 Occlusion and stenosis of unspecified carotid artery: Secondary | ICD-10-CM | POA: Diagnosis not present

## 2021-03-26 DIAGNOSIS — M549 Dorsalgia, unspecified: Secondary | ICD-10-CM | POA: Diagnosis not present

## 2021-03-28 DIAGNOSIS — E43 Unspecified severe protein-calorie malnutrition: Secondary | ICD-10-CM | POA: Diagnosis not present

## 2021-03-28 DIAGNOSIS — F801 Expressive language disorder: Secondary | ICD-10-CM | POA: Diagnosis not present

## 2021-03-28 DIAGNOSIS — Z85038 Personal history of other malignant neoplasm of large intestine: Secondary | ICD-10-CM | POA: Diagnosis not present

## 2021-03-28 DIAGNOSIS — I10 Essential (primary) hypertension: Secondary | ICD-10-CM | POA: Diagnosis not present

## 2021-03-28 DIAGNOSIS — G8929 Other chronic pain: Secondary | ICD-10-CM | POA: Diagnosis not present

## 2021-03-28 DIAGNOSIS — M546 Pain in thoracic spine: Secondary | ICD-10-CM | POA: Diagnosis not present

## 2021-03-28 DIAGNOSIS — I714 Abdominal aortic aneurysm, without rupture: Secondary | ICD-10-CM | POA: Diagnosis not present

## 2021-03-28 DIAGNOSIS — M545 Low back pain, unspecified: Secondary | ICD-10-CM | POA: Diagnosis not present

## 2021-03-28 DIAGNOSIS — I6529 Occlusion and stenosis of unspecified carotid artery: Secondary | ICD-10-CM | POA: Diagnosis not present

## 2021-04-04 DIAGNOSIS — Z85038 Personal history of other malignant neoplasm of large intestine: Secondary | ICD-10-CM | POA: Diagnosis not present

## 2021-04-04 DIAGNOSIS — M545 Low back pain, unspecified: Secondary | ICD-10-CM | POA: Diagnosis not present

## 2021-04-04 DIAGNOSIS — I714 Abdominal aortic aneurysm, without rupture: Secondary | ICD-10-CM | POA: Diagnosis not present

## 2021-04-04 DIAGNOSIS — G8929 Other chronic pain: Secondary | ICD-10-CM | POA: Diagnosis not present

## 2021-04-04 DIAGNOSIS — E43 Unspecified severe protein-calorie malnutrition: Secondary | ICD-10-CM | POA: Diagnosis not present

## 2021-04-04 DIAGNOSIS — I10 Essential (primary) hypertension: Secondary | ICD-10-CM | POA: Diagnosis not present

## 2021-04-04 DIAGNOSIS — I6529 Occlusion and stenosis of unspecified carotid artery: Secondary | ICD-10-CM | POA: Diagnosis not present

## 2021-04-04 DIAGNOSIS — M546 Pain in thoracic spine: Secondary | ICD-10-CM | POA: Diagnosis not present

## 2021-04-04 DIAGNOSIS — M533 Sacrococcygeal disorders, not elsewhere classified: Secondary | ICD-10-CM | POA: Diagnosis not present

## 2021-04-04 DIAGNOSIS — F801 Expressive language disorder: Secondary | ICD-10-CM | POA: Diagnosis not present

## 2021-04-11 DIAGNOSIS — M545 Low back pain, unspecified: Secondary | ICD-10-CM | POA: Diagnosis not present

## 2021-04-11 DIAGNOSIS — M546 Pain in thoracic spine: Secondary | ICD-10-CM | POA: Diagnosis not present

## 2021-04-16 DIAGNOSIS — M533 Sacrococcygeal disorders, not elsewhere classified: Secondary | ICD-10-CM | POA: Diagnosis not present

## 2021-04-20 DIAGNOSIS — M545 Low back pain, unspecified: Secondary | ICD-10-CM | POA: Diagnosis not present

## 2021-04-25 DIAGNOSIS — I714 Abdominal aortic aneurysm, without rupture: Secondary | ICD-10-CM | POA: Diagnosis not present

## 2021-04-25 DIAGNOSIS — F801 Expressive language disorder: Secondary | ICD-10-CM | POA: Diagnosis not present

## 2021-04-25 DIAGNOSIS — I6529 Occlusion and stenosis of unspecified carotid artery: Secondary | ICD-10-CM | POA: Diagnosis not present

## 2021-04-25 DIAGNOSIS — Z85038 Personal history of other malignant neoplasm of large intestine: Secondary | ICD-10-CM | POA: Diagnosis not present

## 2021-04-25 DIAGNOSIS — G8929 Other chronic pain: Secondary | ICD-10-CM | POA: Diagnosis not present

## 2021-04-25 DIAGNOSIS — E43 Unspecified severe protein-calorie malnutrition: Secondary | ICD-10-CM | POA: Diagnosis not present

## 2021-04-25 DIAGNOSIS — I10 Essential (primary) hypertension: Secondary | ICD-10-CM | POA: Diagnosis not present

## 2021-05-02 DIAGNOSIS — I714 Abdominal aortic aneurysm, without rupture: Secondary | ICD-10-CM | POA: Diagnosis not present

## 2021-05-02 DIAGNOSIS — F801 Expressive language disorder: Secondary | ICD-10-CM | POA: Diagnosis not present

## 2021-05-02 DIAGNOSIS — I6529 Occlusion and stenosis of unspecified carotid artery: Secondary | ICD-10-CM | POA: Diagnosis not present

## 2021-05-02 DIAGNOSIS — Z85038 Personal history of other malignant neoplasm of large intestine: Secondary | ICD-10-CM | POA: Diagnosis not present

## 2021-05-02 DIAGNOSIS — G8929 Other chronic pain: Secondary | ICD-10-CM | POA: Diagnosis not present

## 2021-05-02 DIAGNOSIS — I10 Essential (primary) hypertension: Secondary | ICD-10-CM | POA: Diagnosis not present

## 2021-05-02 DIAGNOSIS — E43 Unspecified severe protein-calorie malnutrition: Secondary | ICD-10-CM | POA: Diagnosis not present

## 2021-05-14 DIAGNOSIS — M47816 Spondylosis without myelopathy or radiculopathy, lumbar region: Secondary | ICD-10-CM | POA: Diagnosis not present

## 2021-05-14 DIAGNOSIS — M533 Sacrococcygeal disorders, not elsewhere classified: Secondary | ICD-10-CM | POA: Diagnosis not present

## 2021-05-29 DIAGNOSIS — E559 Vitamin D deficiency, unspecified: Secondary | ICD-10-CM | POA: Diagnosis not present

## 2021-05-29 DIAGNOSIS — Z0000 Encounter for general adult medical examination without abnormal findings: Secondary | ICD-10-CM | POA: Diagnosis not present

## 2021-05-29 DIAGNOSIS — I471 Supraventricular tachycardia: Secondary | ICD-10-CM | POA: Diagnosis not present

## 2021-05-29 DIAGNOSIS — E538 Deficiency of other specified B group vitamins: Secondary | ICD-10-CM | POA: Diagnosis not present

## 2021-05-29 DIAGNOSIS — E46 Unspecified protein-calorie malnutrition: Secondary | ICD-10-CM | POA: Diagnosis not present

## 2021-05-29 DIAGNOSIS — I714 Abdominal aortic aneurysm, without rupture: Secondary | ICD-10-CM | POA: Diagnosis not present

## 2021-05-29 DIAGNOSIS — Z79899 Other long term (current) drug therapy: Secondary | ICD-10-CM | POA: Diagnosis not present

## 2021-05-29 DIAGNOSIS — I779 Disorder of arteries and arterioles, unspecified: Secondary | ICD-10-CM | POA: Diagnosis not present

## 2021-05-29 DIAGNOSIS — I700 Atherosclerosis of aorta: Secondary | ICD-10-CM | POA: Diagnosis not present

## 2021-05-29 DIAGNOSIS — N183 Chronic kidney disease, stage 3 unspecified: Secondary | ICD-10-CM | POA: Diagnosis not present

## 2021-05-29 DIAGNOSIS — E785 Hyperlipidemia, unspecified: Secondary | ICD-10-CM | POA: Diagnosis not present

## 2021-05-29 DIAGNOSIS — D649 Anemia, unspecified: Secondary | ICD-10-CM | POA: Diagnosis not present

## 2021-07-10 ENCOUNTER — Encounter (HOSPITAL_BASED_OUTPATIENT_CLINIC_OR_DEPARTMENT_OTHER): Payer: Self-pay | Admitting: *Deleted

## 2021-07-10 ENCOUNTER — Emergency Department (HOSPITAL_BASED_OUTPATIENT_CLINIC_OR_DEPARTMENT_OTHER)
Admission: EM | Admit: 2021-07-10 | Discharge: 2021-07-10 | Disposition: A | Discharge status: Home / Self Care | Payer: Medicare Other | Attending: Emergency Medicine | Admitting: Emergency Medicine

## 2021-07-10 ENCOUNTER — Other Ambulatory Visit: Payer: Self-pay

## 2021-07-10 DIAGNOSIS — I10 Essential (primary) hypertension: Secondary | ICD-10-CM | POA: Insufficient documentation

## 2021-07-10 DIAGNOSIS — H9202 Otalgia, left ear: Secondary | ICD-10-CM | POA: Insufficient documentation

## 2021-07-10 DIAGNOSIS — Z87891 Personal history of nicotine dependence: Secondary | ICD-10-CM | POA: Insufficient documentation

## 2021-07-10 DIAGNOSIS — Z85038 Personal history of other malignant neoplasm of large intestine: Secondary | ICD-10-CM | POA: Diagnosis not present

## 2021-07-10 DIAGNOSIS — Z79899 Other long term (current) drug therapy: Secondary | ICD-10-CM | POA: Insufficient documentation

## 2021-07-10 MED ORDER — CIPROFLOXACIN-DEXAMETHASONE 0.3-0.1 % OT SUSP: 4.0000 [drp] | Freq: Two times a day (BID) | OTIC | 0 refills | Status: AC

## 2021-07-10 NOTE — ED Triage Notes (Signed)
Left ear pain since last night

## 2021-07-10 NOTE — ED Provider Notes (Signed)
[facility] EMERGENCY DEPT Provider Note   CSN: [id number] Arrival date & time: 07/10/21  1831     History Chief Complaint  Patient presents with   Otalgia    [name] is a 87 y.o. female.  The history is provided by the patient.  Otalgia Location:  Left Behind ear:  No abnormality Quality:  Aching Severity:  Mild Onset quality:  Gradual Duration:  3 days Timing:  Intermittent Progression:  Waxing and waning Chronicity:  New Relieved by:  Nothing Worsened by:  Nothing Associated symptoms: no abdominal pain, no congestion, no cough, no diarrhea, no ear discharge, no fever, no headaches, no hearing loss, no neck pain, no rash, no rhinorrhea, no sore throat, no tinnitus and no vomiting       Past Medical History:  Diagnosis Date   Arthritis    Bowel perforation ([place]) 2012   Cancer [facility])    colon   Carotid artery occlusion    Hyperlipidemia    Hypertension     Patient Active Problem List   Diagnosis Date Noted   Peritonitis ([place]) 12/25/2020   Bowel perforation ([place]) 12/25/2020   Perforated duodenal ulcer ([place])    AKI (acute kidney injury) ([place])    Aftercare following surgery of the circulatory system, NEC 08/09/2014   Occlusion and stenosis of carotid artery without mention of cerebral infarction 07/27/2013   History of sigmoid colon cancer, T4, N0. Resected 04/03/2011. 10/30/2011    Past Surgical History:  Procedure Laterality Date   ABDOMINAL HYSTERECTOMY     CAROTID ENDARTERECTOMY Right 04-05-08   CE   COLON SURGERY     colon resection with colostomy 03/2011   COLONOSCOPY N/A 07/09/2013   Procedure: COLONOSCOPY;  Surgeon: Jeryl Columbia, MD;  Location: [facility] ENDOSCOPY;  Service: Endoscopy;  Laterality: N/A;   COLOSTOMY     COLOSTOMY TAKEDOWN  07/30/2012   Procedure: LAPAROSCOPIC COLOSTOMY TAKEDOWN;  Surgeon: Shann Medal, MD;  Location: [facility] ORS;  Service: General;  Laterality: N/A;  laparoscopic colostomy reversal plus bilateral ureteral  stents   HOT HEMOSTASIS N/A 07/09/2013   Procedure: HOT HEMOSTASIS (ARGON PLASMA COAGULATION/BICAP);  Surgeon: Jeryl Columbia, MD;  Location: [facility];  Service: Endoscopy;  Laterality: N/A;   JEJUNOSTOMY  12/25/2020   Procedure: JEJUNOSTOMY;  Surgeon: Dwan Bolt, MD;  Location: [facility];  Service: General;;   LAPAROTOMY N/A 12/25/2020   Procedure: EXPLORATORY LAPAROTOMY;  Surgeon: Dwan Bolt, MD;  Location: [facility];  Service: General;  Laterality: N/A;   REPAIR OF PERFORATED ULCER  12/25/2020   Procedure: REPAIR OF PERFORATED DUODENUM WITH ULCER;  Surgeon: Dwan Bolt, MD;  Location: [facility];  Service: General;;     OB History     Gravida  4   Para  4   Term      Preterm      AB      Living         SAB      IAB      Ectopic      Multiple      Live Births              Family History  Problem Relation Age of Onset   Heart disease Mother        heart attack   Cancer Sister        breast    Social History   Tobacco Use   Smoking status: Former    Types: Cigarettes  Quit date: 12/24/1978    Years since quitting: 42.5   Smokeless tobacco: Never  Vaping Use   Vaping Use: Never used  Substance Use Topics   Alcohol use: Yes    Comment: glass of wine at christmas   Drug use: No    Home Medications Prior to Admission medications   Medication Sig Start Date End Date Taking? Authorizing Provider  amLODipine (NORVASC) 10 MG tablet Take 1 tablet (10 mg total) by mouth daily. 01/03/21  Yes Domenic Polite, MD  CALCIUM-VITAMIN D PO Take 1 tablet by mouth daily.   Yes [provider]  ciprofloxacin-dexamethasone (CIPRODEX) OTIC suspension Place 4 drops into the left ear 2 (two) times daily for 10 days. 07/10/21 07/20/21 Yes Ashaunti Treptow, DO  Cyanocobalamin (VITAMIN B-12 CR PO) Take 1 tablet by mouth daily.   Yes [provider]  fluorometholone (FML) 0.1 % ophthalmic suspension Place 1 drop into both eyes daily. 12/14/15  Yes [provider]  acetaminophen (TYLENOL) 325 MG tablet Take 2 tablets (650 mg total) by mouth every 6 (six) hours as needed for mild pain or headache. 01/02/21   Domenic Polite, MD  alendronate (FOSAMAX) 70 MG tablet Take 70 mg by mouth every Sunday. 10/24/20   [provider]  methocarbamol (ROBAXIN) 500 MG tablet Take 1 tablet (500 mg total) by mouth every 8 (eight) hours as needed for muscle spasms. 01/02/21   Domenic Polite, MD  metoprolol tartrate (LOPRESSOR) 25 MG tablet Take 0.5 tablets (12.5 mg total) by mouth 2 (two) times daily. 01/02/21   Domenic Polite, MD  Nutritional Supplements (FEEDING SUPPLEMENT, OSMOLITE 1.2 CAL,) LIQD Place 720 mLs into feeding tube daily. Run this for 10hours at night only 01/02/21   Domenic Polite, MD  ondansetron (ZOFRAN ODT) 4 MG disintegrating tablet Take 1 tablet (4 mg total) by mouth every 8 (eight) hours as needed for nausea or vomiting. 01/02/21   Domenic Polite, MD  pantoprazole (PROTONIX) 40 MG tablet Take 1 tablet (40 mg total) by mouth daily at 12 noon. 01/02/21   Domenic Polite, MD  Polyethyl Glycol-Propyl Glycol 0.4-0.3 % SOLN Place 1 drop into both eyes 2 (two) times daily as needed (dry eyes).     [provider]  rosuvastatin (CRESTOR) 10 MG tablet Take 10 mg by mouth every evening.    [provider]    Allergies    Nsaids  Review of Systems   Review of Systems  Constitutional:  Negative for fever.  HENT:  Positive for ear pain. Negative for congestion, ear discharge, hearing loss, rhinorrhea, sore throat and tinnitus.   Respiratory:  Negative for cough.   Gastrointestinal:  Negative for abdominal pain, diarrhea and vomiting.  Musculoskeletal:  Negative for neck pain.  Skin:  Negative for rash.  Neurological:  Negative for headaches.   Physical Exam Updated Vital Signs BP (!) 153/88 (BP Location: Left Arm)   Pulse 74   Temp 98.6 F (37 C) (Oral)   Resp 16   Ht '5\' 3"'$  (1.6 m)   Wt 54.3 kg   SpO2 96%    BMI 21.21 kg/m   Physical Exam HENT:     Head: Normocephalic.     Right Ear: Tympanic membrane, ear canal and external ear normal. There is no impacted cerumen.     Ears:     Comments: Left ear : there is some tenderness to the external auditory canal that is narrow with some wax and may be some inflammation, TM appears  to be intact.  She does have a skin growth within the left ear area but does not appear to be infectious, no abscess. Right ear: Normal TM, external auditory canal clear    Nose: Nose normal.     Mouth/Throat:     Mouth: Mucous membranes are moist.  Eyes:     Extraocular Movements: Extraocular movements intact.     Pupils: Pupils are equal, round, and reactive to light.  Neurological:     Mental Status: She is alert.    ED Results / Procedures / Treatments   Labs (all labs ordered are listed, but only abnormal results are displayed) Labs Reviewed - No data to display  EKG None  Radiology No results found.  Procedures Procedures   Medications Ordered in ED Medications - No data to display  ED Course  I have reviewed the triage vital signs and the nursing notes.  Pertinent labs & imaging results that were available during my care of the patient were reviewed by me and considered in my medical decision making (see chart for details).    MDM Rules/Calculators/A&P                           Cline Crock is here for left ear pain.  Suspect an otitis externa.  Will prescribe Ciprodex.  We will have her follow-up with ENT.  She has fairly narrow ear canals bilaterally but seems more narrow on the left with may be some inflammation, possibly earwax.  TM from what I can see in the left side appears to be intact.  No concern for mastoiditis.  We will have her follow-up with ENT if pain is persistent.  Discharged in good condition.  This chart was dictated using voice recognition software.  Despite best efforts to proofread,  errors can occur which can change  the documentation meaning.  Final Clinical Impression(s) / ED Diagnoses Final diagnoses:  Otalgia of left ear    Rx / DC Orders ED Discharge Orders          Ordered    ciprofloxacin-dexamethasone (CIPRODEX) OTIC suspension  2 times daily        07/10/21 1903             Lennice Sites, DO 07/10/21 1906

## 2021-07-17 DIAGNOSIS — H938X3 Other specified disorders of ear, bilateral: Secondary | ICD-10-CM | POA: Diagnosis not present

## 2021-07-17 DIAGNOSIS — H61892 Other specified disorders of left external ear: Secondary | ICD-10-CM | POA: Diagnosis not present

## 2021-07-17 DIAGNOSIS — Z8669 Personal history of other diseases of the nervous system and sense organs: Secondary | ICD-10-CM | POA: Diagnosis not present

## 2021-08-31 ENCOUNTER — Telehealth: Payer: Self-pay

## 2021-08-31 NOTE — Telephone Encounter (Signed)
(  2:46 pm) SW scheduled initial palliative visit with RN/SW team. Visit is scheduled for 09/06/21 @ 9 am.

## 2021-09-06 ENCOUNTER — Other Ambulatory Visit: Payer: Self-pay

## 2021-09-06 ENCOUNTER — Other Ambulatory Visit: Payer: Medicare Other

## 2021-09-06 ENCOUNTER — Other Ambulatory Visit: Payer: Medicare Other | Admitting: *Deleted

## 2021-09-06 VITALS — BP 108/63 | HR 74 | Temp 97.8°F | Resp 16

## 2021-09-06 DIAGNOSIS — Z515 Encounter for palliative care: Secondary | ICD-10-CM

## 2021-09-06 NOTE — Progress Notes (Signed)
COMMUNITY PALLIATIVE CARE SW NOTE  PATIENT NAME: Haley FREIBERGER DOB: [birth year] MRN: [id number]  PRIMARY CARE PROVIDER: Jonathon Jordan, MD  RESPONSIBLE PARTY:  Acct ID - Guarantor Home Phone Work Phone Relationship Acct Type  [id number] SAANVI, [phone]  Self P/F     [place], [place], [place] [address]     PLAN OF CARE and INTERVENTIONS:             GOALS OF CARE/ ADVANCE CARE PLANNING:  Goal is for patient is to remain in her home as independent and safe as possible. Patient is a DNR>  SOCIAL/EMOTIONAL/SPIRITUAL ASSESSMENT/ INTERVENTIONS:  SW and RN completed an initial visit with patient at her home. She was present with her daughter/PCG-Linda. Education was provided to patient/PCG regarding palliative care services and verbal consent was provided for palliative care services. Patient was observed up in her kitchen before she came in to sit with the team. Her affect was flat and she was engaged only when prompted, but she was remained cordial to the team. Patient is hard of hearing. Her daughter provided a status update on patient. Since patient's surgery, she has a in-home caregiver (April)for  2 hours a day/4 days a week from 11am -1pm through [place]. [employer]. Patient is relatively independent for all personal care and ADL's, but needs reminders and intermittent assistance. She is no longer driving. She has chronic pain and has an order for physical therapy. She also has expressive aphasia. Patient ambulates independently, but has a walker available when needed. She is sleeping well at night. Patient's appetite is fair. She does receives mobile meals. The family has added additional safety precautions in the home such as in-door electronic monitor, grab bars in the shower and a raised toilet seat. SOCIAL HISTORY: Patient was born and raised in [place]. She completed high school. 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 She is widowed and have three  children. She is Catholic by [employer]. Her daughter serves as her POA/HCPOA, she has a living will and has DNR. SW provided supportive presence, active listening, assessment of needs and comfort, education, reinforced  access to support. PATIENT/CAREGIVER EDUCATION/ COPING:  Patient appears to be coping well.  PERSONAL EMERGENCY PLAN:  911 can be activated for emergency.  COMMUNITY RESOURCES COORDINATION/ HEALTH CARE NAVIGATION:  Patient has private caregiver through [place]. [place] for 34 hours a week. Patient has an order for physical therapy that nurse navigator will assist with.  FINANCIAL/LEGAL CONCERNS/INTERVENTIONS:  None.      SOCIAL HX:  Social History   Tobacco Use   Smoking status: Former    Types: Cigarettes    Quit date: 12/24/1978    Years since quitting: 42.7   Smokeless tobacco: Never  Substance Use Topics   Alcohol use: Yes    Comment: glass of wine at [place]: DNR ADVANCED DIRECTIVES: Yes MOST FORM COMPLETE: No HOSPICE EDUCATION PROVIDED: No  PPS: Patient is alert and oriented x3 with mild cognitive deficits. She is independent for ADL's, but requires reminders and intermittent assistance.    Duration of visit and documentation: 60 minutes.   [address] [place], [place]

## 2021-09-19 NOTE — Progress Notes (Signed)
[facility] COMMUNITY PALLIATIVE CARE RN NOTE  PATIENT NAME: [name] DOB: 01/27/1932 MRN: [id number]  PRIMARY CARE PROVIDER: Wolters, Sharon, MD  RESPONSIBLE PARTY: Linda (daughter) Acct ID - Guarantor Home Phone Work Phone Relationship Acct Type  [id number] - Moroz,S* [phone]  Self P/F     [address], [place], [place] [address]   Covid-19 Pre-screening Negative  PLAN OF CARE and INTERVENTION:  ADVANCE CARE PLANNING/GOALS OF CARE: Goal is for patient to be comfortable with living at home and wants to remain there for as long as possible.  PATIENT/CAREGIVER EDUCATION: Explained palliative care services, symptom management, safe mobility DISEASE STATUS: Joint visit made with LCSW, M. Lonon. Met with patient and her daughter, Linda, in patient's home. Initially, patient was piddling around in the kitchen then eventually came into the living room where team was seated. Patient has dementia and is forgetful. Linda says she often forgets what is going on and becomes very frustrated. She has some expressive aphasia. She denies pain at this time. Respirations are even, regular and unlabored. She is ambulatory and uses a rollator when traveling outside of the home. She is independent for the most part, but is unable to prepare meals or perform household chores. She is no longer driving. She has a hired caregiver for 2 hours per day/4 days a week. She enjoys going out with caregivers especially to the grocery store. She has not been eating much at mealtimes due to abdominal discomfort. She was referred to GI and has an appointment next month. In the meantime, Pepcid has been added to her medication regimen but hasn't been delivered to the home as of yet. She is receiving Meals on wheels. Daughter has cameras in the home for when patient is in the home by herself. She also has a padlock on her front door. She has not tried to wander. Daughter says she was recently given an order for  PT. She has faxed this to a home health agency but was unsuccessful in getting this initiated. We requested that she send us a copy of the PT order and we would have our Clinical nurse navigator assist her with this. She is agreeable to future visits with palliative care. Consent signed.  HISTORY OF PRESENT ILLNESS: This is a 87 yo female with a past medical history of occlusion and stenosis of carotid artery, bowel perforation and sigmoid colon cancer. Palliative care team has been asked to follow patient for additional support, goals of care and complex decision making.    CODE STATUS: DNR ADVANCED DIRECTIVES: N MOST FORM: no PPS: 60%   PHYSICAL EXAM:   VITALS: Today's Vitals   09/06/21 0931  BP: 108/63  Pulse: 74  Resp: 16  Temp: 97.8 F (36.6 C)  TempSrc: Temporal  SpO2: 94%  PainSc: 0-No pain    LUNGS: clear to auscultation  CARDIAC: Cor RRR EXTREMITIES: No edema SKIN:  Exposed skin is dry and intact   NEURO:  Alert and oriented to person/place, forgetful, ambulatory (has rollator walker)   (Duration of visit and documentation 45 minutes)    , RN BSN  

## 2021-10-22 DIAGNOSIS — M79641 Pain in right hand: Secondary | ICD-10-CM | POA: Diagnosis not present

## 2021-10-22 DIAGNOSIS — K279 Peptic ulcer, site unspecified, unspecified as acute or chronic, without hemorrhage or perforation: Secondary | ICD-10-CM | POA: Diagnosis not present

## 2021-11-08 DIAGNOSIS — Z23 Encounter for immunization: Secondary | ICD-10-CM | POA: Diagnosis not present

## 2021-12-13 DIAGNOSIS — I714 Abdominal aortic aneurysm, without rupture, unspecified: Secondary | ICD-10-CM | POA: Diagnosis not present

## 2021-12-13 DIAGNOSIS — R4789 Other speech disturbances: Secondary | ICD-10-CM | POA: Diagnosis not present

## 2021-12-13 DIAGNOSIS — G894 Chronic pain syndrome: Secondary | ICD-10-CM | POA: Diagnosis not present

## 2021-12-13 DIAGNOSIS — I471 Supraventricular tachycardia: Secondary | ICD-10-CM | POA: Diagnosis not present

## 2021-12-13 DIAGNOSIS — R54 Age-related physical debility: Secondary | ICD-10-CM | POA: Diagnosis not present

## 2021-12-13 DIAGNOSIS — I700 Atherosclerosis of aorta: Secondary | ICD-10-CM | POA: Diagnosis not present

## 2021-12-26 DIAGNOSIS — S62607A Fracture of unspecified phalanx of left little finger, initial encounter for closed fracture: Secondary | ICD-10-CM | POA: Diagnosis not present

## 2022-01-09 DIAGNOSIS — S62607D Fracture of unspecified phalanx of left little finger, subsequent encounter for fracture with routine healing: Secondary | ICD-10-CM | POA: Diagnosis not present

## 2022-02-28 ENCOUNTER — Encounter (HOSPITAL_BASED_OUTPATIENT_CLINIC_OR_DEPARTMENT_OTHER): Payer: Self-pay | Admitting: Obstetrics and Gynecology

## 2022-02-28 ENCOUNTER — Emergency Department (HOSPITAL_BASED_OUTPATIENT_CLINIC_OR_DEPARTMENT_OTHER)
Admission: EM | Admit: 2022-02-28 | Discharge: 2022-02-28 | Disposition: A | Discharge status: Home / Self Care | Payer: Medicare Other | Attending: Emergency Medicine | Admitting: Emergency Medicine

## 2022-02-28 ENCOUNTER — Other Ambulatory Visit: Payer: Self-pay

## 2022-02-28 ENCOUNTER — Emergency Department (HOSPITAL_BASED_OUTPATIENT_CLINIC_OR_DEPARTMENT_OTHER): Payer: Medicare Other

## 2022-02-28 DIAGNOSIS — K261 Acute duodenal ulcer with perforation: Secondary | ICD-10-CM | POA: Diagnosis not present

## 2022-02-28 DIAGNOSIS — I10 Essential (primary) hypertension: Secondary | ICD-10-CM | POA: Diagnosis not present

## 2022-02-28 DIAGNOSIS — K5900 Constipation, unspecified: Secondary | ICD-10-CM | POA: Diagnosis not present

## 2022-02-28 DIAGNOSIS — M47816 Spondylosis without myelopathy or radiculopathy, lumbar region: Secondary | ICD-10-CM | POA: Diagnosis not present

## 2022-02-28 DIAGNOSIS — G8929 Other chronic pain: Secondary | ICD-10-CM

## 2022-02-28 DIAGNOSIS — M48061 Spinal stenosis, lumbar region without neurogenic claudication: Secondary | ICD-10-CM | POA: Diagnosis not present

## 2022-02-28 DIAGNOSIS — M4126 Other idiopathic scoliosis, lumbar region: Secondary | ICD-10-CM | POA: Diagnosis not present

## 2022-02-28 DIAGNOSIS — I7143 Infrarenal abdominal aortic aneurysm, without rupture: Secondary | ICD-10-CM | POA: Insufficient documentation

## 2022-02-28 DIAGNOSIS — Z933 Colostomy status: Secondary | ICD-10-CM | POA: Insufficient documentation

## 2022-02-28 DIAGNOSIS — M5136 Other intervertebral disc degeneration, lumbar region: Secondary | ICD-10-CM | POA: Diagnosis not present

## 2022-02-28 DIAGNOSIS — S32591A Other specified fracture of right pubis, initial encounter for closed fracture: Secondary | ICD-10-CM | POA: Diagnosis not present

## 2022-02-28 DIAGNOSIS — Z79899 Other long term (current) drug therapy: Secondary | ICD-10-CM | POA: Insufficient documentation

## 2022-02-28 DIAGNOSIS — M545 Low back pain, unspecified: Secondary | ICD-10-CM | POA: Diagnosis present

## 2022-02-28 LAB — COMPREHENSIVE METABOLIC PANEL
ALT: <5 U/L (ref 0–44)
AST: 8 U/L — ABNORMAL LOW (ref 15–41)
Albumin: 4 g/dL (ref 3.5–5.0)
Alkaline Phosphatase: 42 U/L (ref 38–126)
Anion gap: 11 (ref 5–15)
BUN: 44 mg/dL — ABNORMAL HIGH (ref 8–23)
CO2: 25 mmol/L (ref 22–32)
Calcium: 9 mg/dL (ref 8.9–10.3)
Chloride: 103 mmol/L (ref 98–111)
Creatinine, Ser: 2 mg/dL — ABNORMAL HIGH (ref 0.44–1.00)
GFR, Estimated: 24 mL/min — ABNORMAL LOW (ref >60)
Glucose, Bld: 81 mg/dL (ref 70–99)
Potassium: 3.6 mmol/L (ref 3.5–5.1)
Sodium: 139 mmol/L (ref 135–145)
Total Bilirubin: 0.4 mg/dL (ref 0.3–1.2)
Total Protein: 7 g/dL (ref 6.5–8.1)

## 2022-02-28 LAB — CBC WITH DIFFERENTIAL/PLATELET
Abs Immature Granulocytes: 0.02 10*3/uL (ref 0.00–0.07)
Basophils Absolute: 0 10*3/uL (ref 0.0–0.1)
Basophils Relative: 0 %
Eosinophils Absolute: 0.1 10*3/uL (ref 0.0–0.5)
Eosinophils Relative: 1 %
HCT: 36.1 % (ref 36.0–46.0)
Hemoglobin: 11.5 g/dL — ABNORMAL LOW (ref 12.0–15.0)
Immature Granulocytes: 0 %
Lymphocytes Relative: 16 %
Lymphs Abs: 1.4 10*3/uL (ref 0.7–4.0)
MCH: 28.5 pg (ref 26.0–34.0)
MCHC: 31.9 g/dL (ref 30.0–36.0)
MCV: 89.6 fL (ref 80.0–100.0)
Monocytes Absolute: 0.5 10*3/uL (ref 0.1–1.0)
Monocytes Relative: 6 %
Neutro Abs: 6.8 10*3/uL (ref 1.7–7.7)
Neutrophils Relative %: 77 %
Platelets: 133 10*3/uL — ABNORMAL LOW (ref 150–400)
RBC: 4.03 MIL/uL (ref 3.87–5.11)
RDW: 13.6 % (ref 11.5–15.5)
WBC: 8.8 10*3/uL (ref 4.0–10.5)
nRBC: 0 % (ref 0.0–0.2)

## 2022-02-28 MED ORDER — LIDOCAINE 5 % EX PTCH: 1.0000 | MEDICATED_PATCH | CUTANEOUS | 0 refills | Status: DC

## 2022-02-28 MED ORDER — LIDOCAINE 5 % EX PTCH
1.0000 | MEDICATED_PATCH | CUTANEOUS | Status: DC
  Administered 2022-02-28: 1 via TRANSDERMAL
  Filled 2022-02-28: qty 1

## 2022-02-28 MED ORDER — ACETAMINOPHEN 500 MG PO TABS
1000.0000 mg | ORAL_TABLET | Freq: Once | ORAL | Status: AC
  Administered 2022-02-28: 1000 mg via ORAL
  Filled 2022-02-28: qty 2

## 2022-02-28 NOTE — Discharge Instructions (Addendum)
You have been seen and discharged from the emergency department.  Your CAT scan showed degenerative changes in your lower back which is most likely the source of your pain.  Lidoderm patches and Tylenol for symptom control. Increase fluid intake for dehydration found on blood work today. Have repeat blood work done as outpatient to ensure kidney function back to baseline. Also of note your CAT scan identified and ongoing small aortic aneurysm in the abdomen.  This appears stable however this should be followed up on a yearly basis to ensure that it is not changing.  Follow-up with your primary provider for further evaluation and further care. Take home medications as prescribed. If you have any worsening symptoms or further concerns for your health please return to an emergency department for further evaluation.  We will ?

## 2022-02-28 NOTE — ED Triage Notes (Signed)
Patient reports to the ER for back pain. Patient reports she has bad arthritis in her back. Patient reports she has had nerve cautery x3 times. Patient has also had a ruptured ulcer from being on muscle relaxers. Patient reports she has been seen and was told she cannot take the muscle relaxers anymore following the ruptured ulcer. Patient has expressive aphasia following possible TIAs. Patient is oriented it just takes her extended time to communicate.  ?

## 2022-02-28 NOTE — ED Provider Notes (Signed)
?[facility] EMERGENCY DEPT ?Provider Note ? ? ?CSN: [id number] ?Arrival date & time: 02/28/22  6629 ? ?  ? ?History ? ?Chief Complaint  ?Patient presents with  ? Back Pain  ? ? ?[name] is a 88 y.o. female. ? ?HPI ? ?88 year old female with past medical history of chronic back pain, HTN, HLD, TIA, duodenal ulcer with perforation, AAA presents emergency department with lower back pain. Patient is hard of hearing, sometimes slower to respond but oriented x 3. Patient states that this has been going on chronically, worsening over the past couple days.  She can no longer take muscle relaxer secondary to her history of ulcer.  She states the pain is mid lower back, radiates bilaterally.  Not associated with any genitourinary symptoms.  She had 1 episode of nausea earlier but denies any chest pain, back pain, diarrhea.  No recent fever. ? ?Home Medications ?Prior to Admission medications   ?Medication Sig Start Date End Date Taking? Authorizing Provider  ?acetaminophen (TYLENOL) 325 MG tablet Take 2 tablets (650 mg total) by mouth every 6 (six) hours as needed for mild pain or headache. 01/02/21   Domenic Polite, MD  ?alendronate (FOSAMAX) 70 MG tablet Take 70 mg by mouth every Sunday. 10/24/20   [provider]  ?amLODipine (NORVASC) 10 MG tablet Take 1 tablet (10 mg total) by mouth daily. 01/03/21   Domenic Polite, MD  ?CALCIUM-VITAMIN D PO Take 1 tablet by mouth daily.    [provider]  ?Cyanocobalamin (VITAMIN B-12 CR PO) Take 1 tablet by mouth daily.    [provider]  ?fluorometholone (FML) 0.1 % ophthalmic suspension Place 1 drop into both eyes daily. 12/14/15   [provider]  ?methocarbamol (ROBAXIN) 500 MG tablet Take 1 tablet (500 mg total) by mouth every 8 (eight) hours as needed for muscle spasms. 01/02/21   Domenic Polite, MD  ?metoprolol tartrate (LOPRESSOR) 25 MG tablet Take 0.5 tablets (12.5 mg total) by mouth 2 (two) times daily. 01/02/21    Domenic Polite, MD  ?Nutritional Supplements (FEEDING SUPPLEMENT, OSMOLITE 1.2 CAL,) LIQD Place 720 mLs into feeding tube daily. Run this for 10hours at night only 01/02/21   Domenic Polite, MD  ?ondansetron (ZOFRAN ODT) 4 MG disintegrating tablet Take 1 tablet (4 mg total) by mouth every 8 (eight) hours as needed for nausea or vomiting. 01/02/21   Domenic Polite, MD  ?pantoprazole (PROTONIX) 40 MG tablet Take 1 tablet (40 mg total) by mouth daily at 12 noon. 01/02/21   Domenic Polite, MD  ?Polyethyl Glycol-Propyl Glycol 0.4-0.3 % SOLN Place 1 drop into both eyes 2 (two) times daily as needed (dry eyes).     [provider]  ?   ? ?Allergies    ?Nsaids   ? ?Review of Systems   ?Review of Systems  ?Constitutional:  Negative for fever.  ?Respiratory:  Negative for shortness of breath.   ?Cardiovascular:  Negative for chest pain.  ?Gastrointestinal:  Positive for nausea. Negative for abdominal pain, diarrhea and vomiting.  ?Musculoskeletal:  Positive for back pain.  ?Skin:  Negative for rash.  ?Neurological:  Negative for headaches.  ? ?Physical Exam ?Updated Vital Signs ?BP (!) 156/77   Pulse 60   Temp 97.9 ?F (36.6 ?C)   Resp 15   SpO2 100%  ?Physical Exam ?Vitals and nursing note reviewed.  ?Constitutional:   ?   Appearance: Normal appearance.  ?HENT:  ?   Head: Normocephalic.  ?   Mouth/Throat:  ?  Mouth: Mucous membranes are moist.  ?Cardiovascular:  ?   Rate and Rhythm: Normal rate.  ?Pulmonary:  ?   Effort: Pulmonary effort is normal. No respiratory distress.  ?Abdominal:  ?   Palpations: Abdomen is soft.  ?   Tenderness: There is no abdominal tenderness.  ?Musculoskeletal:  ?   Comments: Tenderness to palpation along the lumbar spine and laterally, more specifically on the right, no overlying rash  ?Skin: ?   General: Skin is warm.  ?Neurological:  ?   Mental Status: She is alert and oriented to person, place, and time. Mental status is at baseline.  ?Psychiatric:     ?   Mood and Affect: Mood  normal.  ? ? ?ED Results / Procedures / Treatments   ?Labs ?(all labs ordered are listed, but only abnormal results are displayed) ?Labs Reviewed  ?CBC WITH DIFFERENTIAL/PLATELET - Abnormal; Notable for the following components:  ?    Result Value  ? Hemoglobin 11.5 (*)   ? Platelets 133 (*)   ? All other components within normal limits  ?COMPREHENSIVE METABOLIC PANEL  ? ? ?EKG ?None ? ?Radiology ?No results found. ? ?Procedures ?Procedures  ? ? ?Medications Ordered in ED ?Medications  ?lidocaine (LIDODERM) 5 % 1 patch (1 patch Transdermal Patch Applied 02/28/22 1208)  ?acetaminophen (TYLENOL) tablet 1,000 mg (1,000 mg Oral Given 02/28/22 1208)  ? ? ?ED Course/ Medical Decision Making/ A&P ?  ?                        ?Medical Decision Making ?Amount and/or Complexity of Data Reviewed ?Labs: ordered. ?Radiology: ordered. ? ?Risk ?OTC drugs. ?Prescription drug management. ? ? ?88 year old female presents emergency department with acute on chronic back pain.  Most of it is reproducible mainly in the right lumbar area. Patient is harder of hearing, is oriented and can give history but seems to limited in regards to historian. She has history of a AAA.  Because of this a more extensive work-up was pursued. ? ?Blood work shows slightly worse than baseline AKI, otherwise is reassuring without any acute findings. Patient did not give UA, denies GU symptoms. CT of the lumbar spine shows degenerative disc disease.  CT of the abdomen pelvis which had to be done without IV contrast due to chronic kidney dysfunction showed possible slight increase in AAA but no other concerning findings.  Low suspicion for dissection/ruptured AAA.  After Lidoderm patch and Tylenol patient feels back to baseline, is ambulatory. Patient able to orally hydrate without complain of n/v/d. Offered IV fluids which was declined. Patient understands increased kidney dysfunction and important of oral rehydration and repeat blood work as outpatient.    ? ?Discussed with patient importance of continued monitoring of AAA.  Patient has been a family member at bedside for discharge.  Patient at this time appears safe and stable for discharge and close outpatient follow up. Discharge plan and strict return to ED precautions discussed, patient verbalizes understanding and agreement. ? ? ? ? ? ? ? ?Final Clinical Impression(s) / ED Diagnoses ?Final diagnoses:  ?None  ? ? ?Rx / DC Orders ?ED Discharge Orders   ? ? None  ? ?  ? ? ?  ?Lorelle Gibbs, DO ?02/28/22 1547 ? ?

## 2022-03-03 ENCOUNTER — Emergency Department (HOSPITAL_COMMUNITY): Payer: Medicare Other

## 2022-03-03 ENCOUNTER — Emergency Department (HOSPITAL_COMMUNITY)
Admission: EM | Admit: 2022-03-03 | Discharge: 2022-03-07 | Disposition: A | Discharge status: Skilled Nursing Facility | Payer: Medicare Other | Attending: Emergency Medicine | Admitting: Emergency Medicine

## 2022-03-03 ENCOUNTER — Other Ambulatory Visit: Payer: Self-pay

## 2022-03-03 DIAGNOSIS — I6782 Cerebral ischemia: Secondary | ICD-10-CM | POA: Diagnosis not present

## 2022-03-03 DIAGNOSIS — M545 Low back pain, unspecified: Secondary | ICD-10-CM | POA: Diagnosis not present

## 2022-03-03 DIAGNOSIS — Z85038 Personal history of other malignant neoplasm of large intestine: Secondary | ICD-10-CM | POA: Diagnosis not present

## 2022-03-03 DIAGNOSIS — S32591A Other specified fracture of right pubis, initial encounter for closed fracture: Secondary | ICD-10-CM | POA: Diagnosis not present

## 2022-03-03 DIAGNOSIS — Z9181 History of falling: Secondary | ICD-10-CM | POA: Insufficient documentation

## 2022-03-03 DIAGNOSIS — R296 Repeated falls: Secondary | ICD-10-CM | POA: Insufficient documentation

## 2022-03-03 DIAGNOSIS — S32512A Fracture of superior rim of left pubis, initial encounter for closed fracture: Secondary | ICD-10-CM | POA: Diagnosis not present

## 2022-03-03 DIAGNOSIS — I129 Hypertensive chronic kidney disease with stage 1 through stage 4 chronic kidney disease, or unspecified chronic kidney disease: Secondary | ICD-10-CM | POA: Insufficient documentation

## 2022-03-03 DIAGNOSIS — N3000 Acute cystitis without hematuria: Secondary | ICD-10-CM

## 2022-03-03 DIAGNOSIS — N189 Chronic kidney disease, unspecified: Secondary | ICD-10-CM | POA: Diagnosis not present

## 2022-03-03 DIAGNOSIS — M6281 Muscle weakness (generalized): Secondary | ICD-10-CM | POA: Insufficient documentation

## 2022-03-03 DIAGNOSIS — G238 Other specified degenerative diseases of basal ganglia: Secondary | ICD-10-CM | POA: Diagnosis not present

## 2022-03-03 DIAGNOSIS — S32592A Other specified fracture of left pubis, initial encounter for closed fracture: Secondary | ICD-10-CM | POA: Diagnosis not present

## 2022-03-03 DIAGNOSIS — F0390 Unspecified dementia without behavioral disturbance: Secondary | ICD-10-CM | POA: Diagnosis not present

## 2022-03-03 DIAGNOSIS — Z8616 Personal history of COVID-19: Secondary | ICD-10-CM | POA: Insufficient documentation

## 2022-03-03 DIAGNOSIS — R6889 Other general symptoms and signs: Secondary | ICD-10-CM | POA: Diagnosis not present

## 2022-03-03 DIAGNOSIS — Z20822 Contact with and (suspected) exposure to covid-19: Secondary | ICD-10-CM | POA: Insufficient documentation

## 2022-03-03 DIAGNOSIS — R2681 Unsteadiness on feet: Secondary | ICD-10-CM | POA: Insufficient documentation

## 2022-03-03 DIAGNOSIS — I10 Essential (primary) hypertension: Secondary | ICD-10-CM | POA: Diagnosis not present

## 2022-03-03 DIAGNOSIS — R45850 Homicidal ideations: Secondary | ICD-10-CM | POA: Insufficient documentation

## 2022-03-03 DIAGNOSIS — W19XXXA Unspecified fall, initial encounter: Secondary | ICD-10-CM | POA: Diagnosis not present

## 2022-03-03 DIAGNOSIS — M546 Pain in thoracic spine: Secondary | ICD-10-CM | POA: Diagnosis not present

## 2022-03-03 DIAGNOSIS — Z79899 Other long term (current) drug therapy: Secondary | ICD-10-CM | POA: Diagnosis not present

## 2022-03-03 DIAGNOSIS — S3993XA Unspecified injury of pelvis, initial encounter: Secondary | ICD-10-CM | POA: Diagnosis present

## 2022-03-03 DIAGNOSIS — S3992XA Unspecified injury of lower back, initial encounter: Secondary | ICD-10-CM | POA: Diagnosis not present

## 2022-03-03 DIAGNOSIS — Z743 Need for continuous supervision: Secondary | ICD-10-CM | POA: Diagnosis not present

## 2022-03-03 DIAGNOSIS — M549 Dorsalgia, unspecified: Secondary | ICD-10-CM | POA: Diagnosis not present

## 2022-03-03 DIAGNOSIS — S0993XA Unspecified injury of face, initial encounter: Secondary | ICD-10-CM | POA: Diagnosis not present

## 2022-03-03 DIAGNOSIS — S299XXA Unspecified injury of thorax, initial encounter: Secondary | ICD-10-CM | POA: Diagnosis not present

## 2022-03-03 DIAGNOSIS — G9389 Other specified disorders of brain: Secondary | ICD-10-CM | POA: Diagnosis not present

## 2022-03-03 DIAGNOSIS — M47812 Spondylosis without myelopathy or radiculopathy, cervical region: Secondary | ICD-10-CM | POA: Diagnosis not present

## 2022-03-03 DIAGNOSIS — S199XXA Unspecified injury of neck, initial encounter: Secondary | ICD-10-CM | POA: Diagnosis not present

## 2022-03-03 HISTORY — DX: Chronic kidney disease, unspecified: N18.9

## 2022-03-03 HISTORY — DX: Chronic pain syndrome: G89.4

## 2022-03-03 HISTORY — DX: Supraventricular tachycardia, unspecified: I47.10

## 2022-03-03 HISTORY — DX: Unspecified dementia, unspecified severity, without behavioral disturbance, psychotic disturbance, mood disturbance, and anxiety: F03.90

## 2022-03-03 HISTORY — DX: Supraventricular tachycardia: I47.1

## 2022-03-03 LAB — CBC WITH DIFFERENTIAL/PLATELET
Abs Immature Granulocytes: 0.06 10*3/uL (ref 0.00–0.07)
Basophils Absolute: 0 10*3/uL (ref 0.0–0.1)
Basophils Relative: 0 %
Eosinophils Absolute: 0.1 10*3/uL (ref 0.0–0.5)
Eosinophils Relative: 1 %
HCT: 33.9 % — ABNORMAL LOW (ref 36.0–46.0)
Hemoglobin: 11.1 g/dL — ABNORMAL LOW (ref 12.0–15.0)
Immature Granulocytes: 1 %
Lymphocytes Relative: 12 %
Lymphs Abs: 1.2 10*3/uL (ref 0.7–4.0)
MCH: 29.8 pg (ref 26.0–34.0)
MCHC: 32.7 g/dL (ref 30.0–36.0)
MCV: 90.9 fL (ref 80.0–100.0)
Monocytes Absolute: 0.8 10*3/uL (ref 0.1–1.0)
Monocytes Relative: 8 %
Neutro Abs: 7.7 10*3/uL (ref 1.7–7.7)
Neutrophils Relative %: 78 %
Platelets: 139 10*3/uL — ABNORMAL LOW (ref 150–400)
RBC: 3.73 MIL/uL — ABNORMAL LOW (ref 3.87–5.11)
RDW: 13.9 % (ref 11.5–15.5)
WBC: 9.8 10*3/uL (ref 4.0–10.5)
nRBC: 0 % (ref 0.0–0.2)

## 2022-03-03 LAB — COMPREHENSIVE METABOLIC PANEL
ALT: 9 U/L (ref 0–44)
AST: 12 U/L — ABNORMAL LOW (ref 15–41)
Albumin: 3.6 g/dL (ref 3.5–5.0)
Alkaline Phosphatase: 50 U/L (ref 38–126)
Anion gap: 12 (ref 5–15)
BUN: 57 mg/dL — ABNORMAL HIGH (ref 8–23)
CO2: 23 mmol/L (ref 22–32)
Calcium: 7.8 mg/dL — ABNORMAL LOW (ref 8.9–10.3)
Chloride: 102 mmol/L (ref 98–111)
Creatinine, Ser: 2.3 mg/dL — ABNORMAL HIGH (ref 0.44–1.00)
GFR, Estimated: 20 mL/min — ABNORMAL LOW (ref >60)
Glucose, Bld: 101 mg/dL — ABNORMAL HIGH (ref 70–99)
Potassium: 3.5 mmol/L (ref 3.5–5.1)
Sodium: 137 mmol/L (ref 135–145)
Total Bilirubin: 0.7 mg/dL (ref 0.3–1.2)
Total Protein: 6.5 g/dL (ref 6.5–8.1)

## 2022-03-03 LAB — URINALYSIS, ROUTINE W REFLEX MICROSCOPIC
Bilirubin Urine: NEGATIVE
Glucose, UA: NEGATIVE mg/dL
Hgb urine dipstick: NEGATIVE
Ketones, ur: NEGATIVE mg/dL
Nitrite: NEGATIVE
Protein, ur: NEGATIVE mg/dL
Specific Gravity, Urine: 1.01 (ref 1.005–1.030)
pH: 5.5 (ref 5.0–8.0)

## 2022-03-03 LAB — URINALYSIS, MICROSCOPIC (REFLEX): Squamous Epithelial / HPF: NONE SEEN (ref 0–5)

## 2022-03-03 LAB — RESP PANEL BY RT-PCR (FLU A&B, COVID) ARPGX2
Influenza A by PCR: NEGATIVE
Influenza B by PCR: NEGATIVE
SARS Coronavirus 2 by RT PCR: NEGATIVE

## 2022-03-03 MED ORDER — FAMOTIDINE 20 MG PO TABS
10.0000 mg | ORAL_TABLET | Freq: Every day | ORAL | Status: DC
  Administered 2022-03-04 – 2022-03-07 (×4): 10 mg via ORAL
  Filled 2022-03-03 (×4): qty 1

## 2022-03-03 MED ORDER — LIDOCAINE 5 % EX PTCH
1.0000 | MEDICATED_PATCH | Freq: Every day | CUTANEOUS | Status: DC | PRN
  Administered 2022-03-03 – 2022-03-05 (×2): 1 via TRANSDERMAL
  Filled 2022-03-03 (×3): qty 1

## 2022-03-03 MED ORDER — FLUTICASONE PROPIONATE 50 MCG/ACT NA SUSP
1.0000 | Freq: Two times a day (BID) | NASAL | Status: DC | PRN
  Filled 2022-03-03: qty 16

## 2022-03-03 MED ORDER — ACETAMINOPHEN 500 MG PO TABS
500.0000 mg | ORAL_TABLET | Freq: Four times a day (QID) | ORAL | Status: DC | PRN
  Administered 2022-03-04 – 2022-03-07 (×3): 500 mg via ORAL
  Filled 2022-03-03 (×3): qty 1

## 2022-03-03 MED ORDER — LACTATED RINGERS IV BOLUS
1000.0000 mL | Freq: Once | INTRAVENOUS | Status: AC
  Administered 2022-03-03: 1000 mL via INTRAVENOUS

## 2022-03-03 MED ORDER — ACETAMINOPHEN 325 MG PO TABS
650.0000 mg | ORAL_TABLET | Freq: Once | ORAL | Status: AC
  Administered 2022-03-03: 650 mg via ORAL
  Filled 2022-03-03: qty 2

## 2022-03-03 NOTE — ED Notes (Signed)
Pt at end of bed when psych team arrived. Pt confused about their clothing and wanted to go. Pt redirected, gown put on, and pulled up in bed. ?

## 2022-03-03 NOTE — ED Provider Notes (Signed)
Patient signed out to me awaiting medical clearance.  IVC filled by family for homicidal ideation and aggressive behavior.  Lab work has already been completed and is overall unremarkable.  Creatinine is at her baseline.  CT imaging of the head and neck are unremarkable.  Lumbar and thoracic spine are also unremarkable.  CT of her pelvis did show acute mildly comminuted and nondisplaced fracture of the left superior and left inferior pubic ramus.  May be a sacral fracture.  However patient is able to ambulate in the room without any discomfort.  I talked with Dr. Lucia Gaskins who recommends weightbearing as tolerated can follow-up outpatient but at this time she appears to be doing well from a pain standpoint.  We will have her seen by psychiatry.  Medically cleared at this time.  I have talked with family and provided them the number with Dr. Lucia Gaskins for follow-up. ? ?This chart was dictated using voice recognition software.  Despite best efforts to proofread,  errors can occur which can change the documentation meaning.  ?  Lennice Sites, DO ?03/03/22 1725 ? ?

## 2022-03-03 NOTE — ED Notes (Signed)
Patient incontinent of urine. Pericare provided, linens changed. Pt provided warm blankets. Bed alarm on for patient safety. ?

## 2022-03-03 NOTE — Discharge Instructions (Addendum)
Follow-up with orthopedics for pelvic fractures. ?Take the antibiotics prescribed for your UTI. ?

## 2022-03-03 NOTE — ED Notes (Signed)
Pericare provided as pt was incontinent of urine. Clean brief applied, pt moved to hospital bed for comfort. Bed exit alarm on for patient safety. Pt offered water and declined. Call light in reach. ?
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
## 2022-03-04 ENCOUNTER — Encounter (HOSPITAL_COMMUNITY): Payer: Self-pay

## 2022-03-04 DIAGNOSIS — S32591A Other specified fracture of right pubis, initial encounter for closed fracture: Secondary | ICD-10-CM | POA: Diagnosis not present

## 2022-03-04 MED ORDER — HALOPERIDOL 5 MG PO TABS
5.0000 mg | ORAL_TABLET | Freq: Once | ORAL | Status: AC
  Administered 2022-03-04: 5 mg via ORAL
  Filled 2022-03-04: qty 1

## 2022-03-04 NOTE — Progress Notes (Addendum)
TOC CSW received consult in regards to ALF and PCS resources . CSW attempted to contact pt's daughter Electa Sniff , no response unable to leave VM, VM full. TOC to follow.  ? ?Adden ?2:30pm ?CSW spoke with pt's daughter in regard to d/c planning. Pt daughter stated she was told her mother needs 24hr care. Pt's daughter reported pt has PCS care in the home 2 hrs 4 times a day through [facility]. She reported she faxed a form to pt's PCP to request additional care hours. CSW informed pt's daughter pt's Medicaid PCS service does not pay for 24hr care, CSW informed pt's daughter there is a cap on hrs the pt can receive in a month. CSW informed pt's daughter about Private Duty Care and provided contact information. Pt's daughter stated she is out of town and will be back on Friday. CSW informed pt's daughter it is inappropriate for pt to board in the ER until Friday. Pt's daughter stated she wants her mother to go to a memory care facility. CSW informed pt's daughter pt cannot be placed in LTC through the ER as this is a long process. CSW informed pt's daughter she will need to work with pt's PCP in the community to get pt placed. Pt's daughter stated she is busy at work and will need to disconnect the call. Pts daughter refuses to come up with an appropriate d/c plan for her mother. TOC to follow. ? ? ?Arlie Solomons.Maegan Buller, MSW, [place] ?[place]  Transitions of Care ?Clinical Social Worker I ?Direct Dial: [phone]  Fax: [phone] ?[email or web address]  ? ?

## 2022-03-04 NOTE — ED Notes (Signed)
Warm pack placed on back to help alleviate back pain. Pt states that she applies heat at home for her pain. Pt states that her pain is improving with heat pack. ?

## 2022-03-04 NOTE — ED Provider Notes (Signed)
Emergency Medicine Observation Re-evaluation Note ? ?[name] is a 88 y.o. female, seen on rounds today.  Pt initially presented to the ED for complaints of IVC, Fall, and Homicidal ?Currently, the patient is eating breakfast. ? ?Physical Exam  ?BP (!) 162/91   Pulse 86   Temp 98.1 ?F (36.7 ?C) (Oral)   Resp 18   Ht '5\' 3"'$  (1.6 m)   Wt 52.2 kg   SpO2 97%   BMI 20.37 kg/m?  ?Physical Exam ?General: calm ?Cardiac: normal rate ?Lungs: no increased WOB ?Psych: calm ? ?ED Course / MDM  ?EKG:EKG Interpretation ? ?Date/Time:  Sunday March 03 2022 13:10:27 EDT ?Ventricular Rate:  65 ?PR Interval:  166 ?QRS Duration: 86 ?QT Interval:  438 ?QTC Calculation: 456 ?R Axis:   65 ?Text Interpretation: Sinus rhythm Multiple ventricular premature complexes Anterior infarct, old No significant change since last tracing Confirmed by Gareth Morgan [id number]) on 03/03/2022 2:53:14 PM ? ?I have reviewed the labs performed to date as well as medications administered while in observation.  Recent changes in the last 24 hours include none. ? ?Plan  ?Current plan is for awaiting psychiatric reassessment today. ?[name] is under involuntary commitment. ?  ? ?  ?Malvin Johns, MD ?03/04/22 [phone] ? ?

## 2022-03-04 NOTE — ED Notes (Signed)
Assisted patient up to bedside commode and back into bed. Pt c/o back pain. Bed exit alarm on and call light in reach. Pt states no further needs. ?

## 2022-03-04 NOTE — ED Notes (Addendum)
At 8:30 AM, pt attempted to get out of bed and swung her arms toward the sitter. Pt calm and cooperative with care from 12:08 PM to present. Frequently, pt motions for staff or asks staff to come inside her room to talk. Pt wearing yellow non-slip socks, bed alarm on, fall risk armbands on both wrists, fall risk magnets on doorframe of pt room, and all staff verbally reminded pt is a high fall risk.  ? ?Asked pt how she feels and pt said, ?I'm going to lose my house if somebody doesn't let me go to my house. I'm mean, my god. And, I'm just so upset that I will lose it.? Asked pt where she lives and pt responded, ?I live in [place] uh what did I do with that paper? I live by myself and [name] lives over on uh [address].? Asked pt about HI and SI and pt responded, ?No why? Oh heavens no. I wouldn't. I couldn't. I'm sitting here not doing anything, and I can't get to it. My house.?  ?

## 2022-03-04 NOTE — ED Notes (Signed)
I responded to bed exit alarm going off - pt was attempting to get out of bed and use the commode. Pt assisted to use the bedside commode and back into bed. Warm blanket provided. Pt repositioned for comfort to alleviate chronic back pain as much as possible. Bed exit alarm on and call light in reach. Water provided. ?

## 2022-03-04 NOTE — Progress Notes (Signed)
CSW received a message from pt's daughter Electa Sniff stating she has contacted several [place] and awaiting a call back. ? ? ?Arlie Solomons.Kenden Brandt, MSW, [place] ?[facility]  Transitions of Care ?Clinical Social Worker I ?Direct Dial: [phone]  Fax: [phone] ?[email or web address]  ?
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
## 2022-03-04 NOTE — ED Provider Notes (Signed)
TTS consultation is appreciated.  Patient will be held in the emergency department overnight for evaluation by psychiatry in the morning. ?  ?Delora Fuel, MD ?03/55/97 [phone] ? ?

## 2022-03-04 NOTE — ED Notes (Signed)
TTS assessment ongoing at this time.   

## 2022-03-04 NOTE — Consult Note (Signed)
Provider spoke with patient's daughter [name] and informed her that patient is Psychiatrically cleared.  She stated that she is out of town till Friday for a business trip.  She also stated nobody is home to tace of her mother but she is looking for 24/7 care giver because her mother's memory is failing and she cannot stay alone.  Patient fell at home last week and broke her Hip.  Yesterday in the ER she attempted walking alone despite been advised not to do so.  [name] also requested placement at ALF until she can find a care giver and bring her home.  She was informed that finding ALF is usually initiated by family members from home.  She is again out of town and reported nobody is home to take patient.  She also plans to get somebody search her mom's home with [employer].  [facility] consult will be put in. ? ?

## 2022-03-04 NOTE — ED Notes (Addendum)
States she's hurting in her feet. ?

## 2022-03-04 NOTE — ED Notes (Signed)
Made one attempt to get out of bed, but redirected by sitter and has remained in bed. ?

## 2022-03-05 DIAGNOSIS — S32591A Other specified fracture of right pubis, initial encounter for closed fracture: Secondary | ICD-10-CM | POA: Diagnosis not present

## 2022-03-05 MED ORDER — CEPHALEXIN 500 MG PO CAPS
500.0000 mg | ORAL_CAPSULE | Freq: Two times a day (BID) | ORAL | Status: DC
  Administered 2022-03-05 – 2022-03-07 (×5): 500 mg via ORAL
  Filled 2022-03-05 (×5): qty 1

## 2022-03-05 MED ORDER — CEPHALEXIN 500 MG PO CAPS
500.0000 mg | ORAL_CAPSULE | Freq: Two times a day (BID) | ORAL | Status: DC
Start: 2022-03-05 — End: 2022-03-05

## 2022-03-05 MED ORDER — ONDANSETRON 4 MG PO TBDP
4.0000 mg | ORAL_TABLET | Freq: Once | ORAL | Status: AC
  Administered 2022-03-05: 4 mg via ORAL
  Filled 2022-03-05: qty 1

## 2022-03-05 NOTE — Progress Notes (Signed)
Pt's daughter was informed the hospital can help assist with short term rehab placement as pt has increase weakness. Ms Deliah Boston requesting pt's information sent out to the facilities in [facility].  ? ?CSW faxed pt information out, bed offers pending.  ? ?Arlie Solomons.Lucille Witts, MSW, [place] ?[place]  Transitions of Care ?Clinical Social Worker I ?Direct Dial: [phone]  Fax: [phone] ?[email or web address]  ?

## 2022-03-05 NOTE — ED Notes (Signed)
Pt. Brief was soiled. Writer changed brief and pads under patient. Nurse Jeneen Rinks Adult nurse with repositioning patient. ?

## 2022-03-05 NOTE — ED Notes (Signed)
States her upper back hurts ?

## 2022-03-05 NOTE — [place] FL2 (Addendum)
?[place] MEDICAID FL2 LEVEL OF CARE SCREENING TOOL  ?  ? ?IDENTIFICATION  ?Patient Name: ?[name] Birthdate: [birth year] Sex: female Admission Date (Current Location): ?03/03/2022  ?[place] and [place] Number: ? [place] ?[id number] K Facility and Address:  ?[facility],  [place], [place] ?     Provider Number: ?[id number]  ?Attending Physician Name and Address:  ?Default, Provider, MD ? Relative Name and Phone Number:  ?Nash,Linda (Daughter)   [phone] (Mobile ?   ?Current Level of Care: ?Hospital Recommended Level of Care: ?[place] Prior Approval Number: ?  ? ?Date Approved/Denied: ?  PASRR Number: ?[id number] A ? ?Discharge Plan: ?SNF ?  ? ?Current Diagnoses: ?Patient Active Problem List  ? Diagnosis Date Noted  ? Peritonitis ([place]) 12/25/2020  ? Bowel perforation ([place]) 12/25/2020  ? Perforated duodenal ulcer ([place])   ? AKI (acute kidney injury) ([place])   ? Aftercare following surgery of the circulatory system, [place] 08/09/2014  ? Occlusion and stenosis of carotid artery without mention of cerebral infarction 07/27/2013  ? History of sigmoid colon cancer, T4, N0. Resected 04/03/2011. 10/30/2011  ? ? ?Orientation RESPIRATION BLADDER Height & Weight   ?  ?Self, Place ? Normal Continent Weight: 115 lb (52.2 kg) ?Height:  '5\' 3"'$  (160 cm)  ?BEHAVIORAL SYMPTOMS/MOOD NEUROLOGICAL BOWEL NUTRITION STATUS  ?[place]   Continent Diet (regular)  ?AMBULATORY STATUS COMMUNICATION OF NEEDS Skin   ?Limited Assist Verbally Normal ?  ?  ?  ?    ?     ?     ? ? ?Personal Care Assistance Level of Assistance  ?Bathing, Feeding, Dressing Bathing Assistance: Limited assistance ?Feeding assistance: Independent ?Dressing Assistance: Limited assistance ?   ? ?Functional Limitations Info  ?Sight, Hearing, Speech Sight Info: Adequate ?Hearing Info: Adequate ?Speech Info: Adequate  ? ? ?SPECIAL CARE FACTORS FREQUENCY  ?OT (By licensed OT), PT (By licensed PT)   ?  ?PT Frequency: 5 x a  week ?OT Frequency: 5 x a week ?  ?  ?  ?   ? ? ?Contractures Contractures Info: Not present  ? ? ?Additional Factors Info  ?Allergies, Code Status Code Status Info: full ?Allergies Info: Nsaids ?  ?  ?  ?   ? ?Current Medications (03/05/2022):  This is the current hospital active medication list ?Current Facility-Administered Medications  ?Medication Dose Route Frequency Provider Last Rate Last Admin  ? acetaminophen (TYLENOL) tablet 500 mg  500 mg Oral Q6H PRN Curatolo, Adam, DO   500 mg at 03/04/22 2016  ? cephALEXin (KEFLEX) capsule 500 mg  500 mg Oral Q12H Regan Lemming, MD   500 mg at 03/05/22 0931  ? famotidine (PEPCID) tablet 10 mg  10 mg Oral Daily Curatolo, Adam, DO   10 mg at 03/05/22 0930  ? fluticasone (FLONASE) 50 MCG/ACT nasal spray 1-2 spray  1-2 spray Each Nare BID PRN Curatolo, Adam, DO      ? lidocaine (LIDODERM) 5 % 1 patch  1 patch Transdermal Daily PRN Curatolo, Adam, DO   1 patch at 03/05/22 0452  ? ?Current Outpatient Medications  ?Medication Sig Dispense Refill  ? acetaminophen (TYLENOL) 500 MG tablet Take 500 mg by mouth every 6 (six) hours as needed (FOR PAIN).    ? Cholecalciferol (VITAMIN D-3 PO) Take 1 capsule by mouth daily with breakfast.    ? Cyanocobalamin (VITAMIN B-12 PO) Take 1 tablet by mouth daily with breakfast.    ? famotidine (PEPCID AC) 10 MG tablet Take 10  mg by mouth daily.    ? fluticasone (FLONASE) 50 MCG/ACT nasal spray Place 1-2 sprays into both nostrils 2 (two) times daily as needed for allergies or rhinitis.    ? HYDROcodone-acetaminophen (NORCO/VICODIN) 5-325 MG tablet Take 1 tablet by mouth daily as needed for pain.    ? ibuprofen (ADVIL) 200 MG tablet Take 400 mg by mouth every 6 (six) hours as needed (FOR PAIN).    ? Lidocaine 4 % PTCH Place 1 patch onto the skin 2 (two) times daily as needed (for upper back pain- remove as directed).    ? metoprolol tartrate (LOPRESSOR) 25 MG tablet Take 0.5 tablets (12.5 mg total) by mouth 2 (two) times daily. (Patient taking  differently: Take 25 mg by mouth in the morning.)    ? Omega-3 Fatty Acids (OMEGA 3 PO) Take 1 capsule by mouth daily.    ? Polyethyl Glycol-Propyl Glycol 0.4-0.3 % SOLN Place 1 drop into both eyes 2 (two) times daily as needed (dry eyes).     ? acetaminophen (TYLENOL) 325 MG tablet Take 2 tablets (650 mg total) by mouth every 6 (six) hours as needed for mild pain or headache. (Patient not taking: Reported on 03/03/2022)    ? amLODipine (NORVASC) 10 MG tablet Take 1 tablet (10 mg total) by mouth daily. (Patient not taking: Reported on 03/03/2022)    ? lidocaine (LIDODERM) 5 % Place 1 patch onto the skin daily. Remove & Discard patch within 12 hours or as directed by MD (Patient not taking: Reported on 03/03/2022) 30 patch 0  ? methocarbamol (ROBAXIN) 500 MG tablet Take 1 tablet (500 mg total) by mouth every 8 (eight) hours as needed for muscle spasms. (Patient not taking: Reported on 03/03/2022)    ? Nutritional Supplements (FEEDING SUPPLEMENT, OSMOLITE 1.2 CAL,) LIQD Place 720 mLs into feeding tube daily. Run this for 10hours at night only (Patient not taking: Reported on 03/03/2022)  0  ? ondansetron (ZOFRAN ODT) 4 MG disintegrating tablet Take 1 tablet (4 mg total) by mouth every 8 (eight) hours as needed for nausea or vomiting. (Patient not taking: Reported on 03/03/2022) 20 tablet 0  ? pantoprazole (PROTONIX) 40 MG tablet Take 1 tablet (40 mg total) by mouth daily at 12 noon. (Patient not taking: Reported on 03/03/2022)    ? ? ? ?Discharge Medications: ?Please see discharge summary for a list of discharge medications. ? ?Relevant Imaging Results: ? ?Relevant Lab Results: ? ? ?Additional Information ?SSN [id number]  COVID x 2 ? ?[place] M Ilma Achee, LCSW ? ? ? ? ?

## 2022-03-05 NOTE — Evaluation (Signed)
Physical Therapy Evaluation ?Patient Details ?Name: [name] ?MRN: [id number] ?DOB: 03-Apr-1932 ?Today's Date: 03/05/2022 ? ?History of Present Illness ? 88 yo female admitted under IVC by family for aggressive behavior. Hx of dementia, back pain, falls, COVID, partial colectomy, colon ca  ?Clinical Impression ? On eval in ED, pt required Min A for mobility. She walked ~40 feet with a RW. She seemed to have a little discomfort with ambulation but she still tolerated the distance well. No family was present during session. PT recommendation is for ST SNF rehab, if pt will agree. If she returns home, recommend 24/7 supervision. Will plan to follow during hospital stay.    ?   ? ?Recommendations for follow up therapy are one component of a multi-disciplinary discharge planning process, led by the attending physician.  Recommendations may be updated based on patient status, additional functional criteria and insurance authorization. ? ?Follow Up Recommendations Skilled nursing-short term rehab (<3 hours/day) (unless pt will have 24/7 supervision. She may not agree to placement. She insists on returning home) ? ?  ?Assistance Recommended at Discharge    ?Patient can return home with the following ? A little help with walking and/or transfers;Assistance with cooking/housework;Help with stairs or ramp for entrance;Assist for transportation ? ?  ?Equipment Recommendations Rolling walker (2 wheels) (if pt doesn't already have one-need to confirm with family)  ?Recommendations for Other Services ? OT consult  ?  ?Functional Status Assessment Patient has had a recent decline in their functional status and demonstrates the ability to make significant improvements in function in a reasonable and predictable amount of time.  ? ?  ?Precautions / Restrictions Precautions ?Precautions: Fall ?Restrictions ?Weight Bearing Restrictions: No  ? ?  ? ?Mobility ? Bed Mobility ?Overal bed mobility: Needs Assistance ?Bed Mobility:  Supine to Sit, Sit to Supine ?  ?  ?Supine to sit: Min guard, HOB elevated ?Sit to supine: Min assist, HOB elevated ?  ?General bed mobility comments: Assist for LEs back onto bed. Increased time. Cues provided. ?  ? ?Transfers ?Overall transfer level: Needs assistance ?Equipment used: Rolling walker (2 wheels) ?Transfers: Sit to/from Stand ?Sit to Stand: Min assist ?  ?  ?  ?  ?  ?General transfer comment: Assist to rise, steady, control descent. Cues for safety ?  ? ?Ambulation/Gait ?Ambulation/Gait assistance: Min assist ?Gait Distance (Feet): 40 Feet ?Assistive device: Rolling walker (2 wheels) ?Gait Pattern/deviations: Step-through pattern, Decreased stride length ?  ?  ?  ?General Gait Details: Less steady initially but stability improved as distance increased. She did require assist throughout distance. Tolerated distance well. No dizziness reported. ? ?Stairs ?  ?  ?  ?  ?  ? ?Wheelchair Mobility ?  ? ?Modified Rankin (Stroke Patients Only) ?  ? ?  ? ?Balance Overall balance assessment: Needs assistance, History of Falls ?  ?  ?  ?  ?Standing balance support: Bilateral upper extremity supported, During functional activity, Reliant on assistive device for balance ?Standing balance-Leahy Scale: Poor ?  ?  ?  ?  ?  ?  ?  ?  ?  ?  ?  ?  ?   ? ? ? ?Pertinent Vitals/Pain Pain Assessment ?Pain Assessment: Faces ?Faces Pain Scale: Hurts a little bit ?Pain Location: LEs when walking ?Pain Descriptors / Indicators: Discomfort, Sore ?Pain Intervention(s): Limited activity within patient's tolerance, Monitored during session, Repositioned  ? ? ?Home Living Family/patient expects to be discharged to:: Unsure ?Living Arrangements: Alone ?Available Help at  Discharge: Family;Available PRN/intermittently ?Type of Home: House ?Home Access: Level entry ?  ?  ?  ?Home Layout: One level ?Home Equipment: Rollator (4 wheels);Shower seat - built in ?   ?  ?Prior Function Prior Level of Function : Independent/Modified Independent ?   ?  ?  ?  ?  ?  ?  ?  ?  ? ? ?Hand Dominance  ?   ? ?  ?Extremity/Trunk Assessment  ? Upper Extremity Assessment ?Upper Extremity Assessment: Defer to OT evaluation ?  ? ?Lower Extremity Assessment ?Lower Extremity Assessment: Generalized weakness ?  ? ?Cervical / Trunk Assessment ?Cervical / Trunk Assessment: Kyphotic  ?Communication  ? Communication: HOH  ?Cognition Arousal/Alertness: Awake/alert ?Behavior During Therapy: [facility] for tasks assessed/performed ?Overall Cognitive Status: History of cognitive impairments - at baseline ?  ?  ?  ?  ?  ?  ?  ?  ?  ?  ?  ?  ?  ?  ?  ?  ?General Comments: pleasant, cooperative ?  ?  ? ?  ?General Comments   ? ?  ?Exercises    ? ?Assessment/Plan  ?  ?PT Assessment Patient needs continued PT services  ?PT Problem List Decreased strength;Decreased mobility;Decreased activity tolerance;Decreased balance;Decreased knowledge of use of DME;Decreased cognition;Decreased safety awareness;Pain ? ?   ?  ?PT Treatment Interventions DME instruction;Gait training;Therapeutic activities;Therapeutic exercise;Patient/family education;Balance training;Functional mobility training;Stair training   ? ?PT Goals (Current goals can be found in the Care Plan section)  ?Acute Rehab PT Goals ?Patient Stated Goal: to return home ?PT Goal Formulation: With patient ?Time For Goal Achievement: 03/19/22 ?Potential to Achieve Goals: Fair ? ?  ?Frequency Min 2X/week ?  ? ? ?Co-evaluation   ?  ?  ?  ?  ? ? ?  ?AM-PAC PT "6 Clicks" Mobility  ?Outcome Measure Help needed turning from your back to your side while in a flat bed without using bedrails?: A Little ?Help needed moving from lying on your back to sitting on the side of a flat bed without using bedrails?: A Little ?Help needed moving to and from a bed to a chair (including a wheelchair)?: A Little ?Help needed standing up from a chair using your arms (e.g., wheelchair or bedside chair)?: A Little ?Help needed to walk in hospital room?: A Little ?Help  needed climbing 3-5 steps with a railing? : A Little ?6 Click Score: 18 ? ?  ?End of Session Equipment Utilized During Treatment: Gait belt ?Activity Tolerance: Patient tolerated treatment well ?Patient left: in bed;with call bell/phone within reach;with bed alarm set;with nursing/sitter in room ?  ?PT Visit Diagnosis: Muscle weakness (generalized) (M62.81);History of falling (Z91.81);Repeated falls (R29.6);Unsteadiness on feet (R26.81) ?  ? ?Time: 0102-7253 ?PT Time Calculation (min) (ACUTE ONLY): 21 min ? ? ?Charges:   PT Evaluation ?$PT Eval Moderate Complexity: 1 Mod ?  ?  ?   ? ? ? ? ? ?Demetruis Depaul P, PT ?Acute Rehabilitation  ?Office: [phone] ?Pager: [phone] ? ?  ?

## 2022-03-05 NOTE — ED Provider Notes (Addendum)
Emergency Medicine Observation Re-evaluation Note ? ?[name] is a 88 y.o. female, seen on rounds today.  Pt initially presented to the ED for complaints of IVC, Fall, and Homicidal ?Currently, the patient is resting. ? ?Physical Exam  ?BP (!) 170/87 (BP Location: Left Arm)   Pulse 73   Temp (!) 97.3 ?F (36.3 ?C) (Oral)   Resp 16   Ht '5\' 3"'$  (1.6 m)   Wt 52.2 kg   SpO2 100%   BMI 20.37 kg/m?  ?Physical Exam ?General: NAD ?Cardiac: well perfused ?Lungs: even and unlabored ?Psych: no agitation ? ?ED Course / MDM  ?EKG:EKG Interpretation ? ?Date/Time:  Sunday March 03 2022 13:10:27 EDT ?Ventricular Rate:  65 ?PR Interval:  166 ?QRS Duration: 86 ?QT Interval:  438 ?QTC Calculation: 456 ?R Axis:   65 ?Text Interpretation: Sinus rhythm Multiple ventricular premature complexes Anterior infarct, old No significant change since last tracing Confirmed by Gareth Morgan [phone]) on 03/03/2022 2:53:14 PM ? ?I have reviewed the labs performed to date as well as medications administered while in observation.  Recent changes in the last 24 hours include Patient urine culture grew out >100,000 CFU GNRs. Sensitivites pending. Patient started on Keflex for a UTI. ? ?Per psych note: "Provider spoke with patient's daughter [name] and informed her that patient is Psychiatrically cleared.  She stated that she is out of town till Friday for a business trip.  She also stated nobody is home to tace of her mother but she is looking for 24/7 care giver because her mother's memory is failing and she cannot stay alone.  Patient fell at home last week and broke her Hip.  Yesterday in the ER she attempted walking alone despite been advised not to do so.  [name] also requested placement at ALF until she can find a care giver and bring her home.  She was informed that finding ALF is usually initiated by family members from home.  She is again out of town and reported nobody is home to take patient.  She also plans to get somebody  search her mom's home with [employer].  [facility] consult will be put in." ? ?Social Work note 3/27: ?2:30pm ?CSW spoke with pt's daughter in regard to d/c planning. Pt daughter stated she was told her mother needs 24hr care. Pt's daughter reported pt has PCS care in the home 2 hrs 4 times a day through [facility]. She reported she faxed a form to pt's PCP to request additional care hours. CSW informed pt's daughter pt's Medicaid PCS service does not pay for 24hr care, CSW informed pt's daughter there is a cap on hrs the pt can receive in a month. CSW informed pt's daughter about Private Duty Care and provided contact information. Pt's daughter stated she is out of town and will be back on Friday. CSW informed pt's daughter it is inappropriate for pt to board in the ER until Friday. Pt's daughter stated she wants her mother to go to a memory care facility. CSW informed pt's daughter pt cannot be placed in LTC through the ER as this is a long process. CSW informed pt's daughter she will need to work with pt's PCP in the community to get pt placed. Pt's daughter stated she is busy at work and will need to disconnect the call. Pts daughter refuses to come up with an appropriate d/c plan for her mother. TOC to follow. ?  ? ?PT consult order placed in coordination with  social work. ? ?Plan  ?Current plan is for Psychiatrically cleared, social work coordinating with family regarding placement vs safe discharge plan. ?[name] is under involuntary commitment. ?  ? ?  ?Regan Lemming, MD ?03/05/22 [phone] ? ?  ?Regan Lemming, MD ?03/05/22 1045 ? ?

## 2022-03-06 DIAGNOSIS — S32591A Other specified fracture of right pubis, initial encounter for closed fracture: Secondary | ICD-10-CM | POA: Diagnosis not present

## 2022-03-06 LAB — URINE CULTURE: Culture: >=100000 — AB

## 2022-03-06 NOTE — Progress Notes (Signed)
RNCM met with Shirlee Limerick with [facility] to guide to patient's room. Shirlee Limerick met with patient and will accept patient via portal. This RNCM will initiate prior authorization via [facility].  ?Bernadene Bell ID: [id number], Josem Kaufmann is pending ? ?TOC will continue to follow. ?

## 2022-03-06 NOTE — Progress Notes (Signed)
SNF beds pending.  ? ?[name].[name], MSW, [place] ?[place]  Transitions of Care ?Clinical Social Worker I ?Direct Dial: [phone]  Fax: [phone] ?[name]  ?

## 2022-03-06 NOTE — ED Provider Notes (Signed)
Emergency Medicine Observation Re-evaluation Note ? ?[name] is a 88 y.o. female, seen on rounds today.  Pt initially presented to the ED for complaints of IVC, Fall, and Homicidal ?Currently, the patient is resting. ? ?Physical Exam  ?BP 111/73 (BP Location: Right Leg)   Pulse 88   Temp 98.2 ?F (36.8 ?C) (Oral)   Resp 16   Ht '5\' 3"'$  (1.6 m)   Wt 52.2 kg   SpO2 98%   BMI 20.37 kg/m?  ?Physical Exam ?General: Calm, no distress ?Cardiac: Warm well perfused ?Lungs: Even unlabored ?Psych: Calm ? ?ED Course / MDM  ?EKG:EKG Interpretation ? ?Date/Time:  Sunday March 03 2022 13:10:27 EDT ?Ventricular Rate:  65 ?PR Interval:  166 ?QRS Duration: 86 ?QT Interval:  438 ?QTC Calculation: 456 ?R Axis:   65 ?Text Interpretation: Sinus rhythm Multiple ventricular premature complexes Anterior infarct, old No significant change since last tracing Confirmed by Gareth Morgan [phone]) on 03/03/2022 2:53:14 PM ? ?I have reviewed the labs performed to date as well as medications administered while in observation.  Recent changes in the last 24 hours include no major changes, reviewed PT eval, recommending SNF, short-term rehab. ? ?Plan  ?Current plan is for placement per TOC. ? [facility]THUR[facility] is not under involuntary commitment. ? ? ?  ?Lucrezia Starch, MD ?03/06/22 1007 ? ?

## 2022-03-06 NOTE — Evaluation (Addendum)
Occupational Therapy Evaluation ?Patient Details ?Name: [name] ?MRN: [id number] ?DOB: Sep 06, 1932 ?Today's Date: 03/06/2022 ? ? ?History of Present Illness 88 yo female admitted under IVC by family for aggressive behavior. Hx of dementia, back pain, falls, COVID, partial colectomy, colon ca  ? ?Clinical Impression ?  ?Patient is a 88 year old female who was admitted for above. Patient was noted to be min A for UB tasks, max A for LB tasks and min A for dynamic standing balance with some posterior leaning noted. Patient is noted to have decreased functional activity tolerance, decreased safety awareness, and decreased endurance impacting participation in ADLs.patient lives at home alone PLOF with patient needing 24/7 caregiver support to be successful in next level of care. Patient would continue to benefit from skilled OT services at this time while admitted and after d/c to address noted deficits in order to improve overall safety and independence in ADLs.  ?  ?   ? ?Recommendations for follow up therapy are one component of a multi-disciplinary discharge planning process, led by the attending physician.  Recommendations may be updated based on patient status, additional functional criteria and insurance authorization.  ? ?Follow Up Recommendations ? Skilled nursing-short term rehab (<3 hours/day)  ?  ?Assistance Recommended at Discharge Frequent or constant Supervision/Assistance  ?Patient can return home with the following A little help with walking and/or transfers;A little help with bathing/dressing/bathroom;Assistance with cooking/housework;Direct supervision/assist for financial management;Assist for transportation;Direct supervision/assist for medications management;Help with stairs or ramp for entrance ? ?  ?Functional Status Assessment ? Patient has had a recent decline in their functional status and demonstrates the ability to make significant improvements in function in a reasonable and  predictable amount of time.  ?Equipment Recommendations ? None recommended by OT  ?  ?Recommendations for Other Services   ? ? ?  ?Precautions / Restrictions Precautions ?Precautions: Fall ?Restrictions ?Weight Bearing Restrictions: No  ? ?  ? ?Mobility Bed Mobility ?Overal bed mobility: Needs Assistance ?Bed Mobility: Supine to Sit, Sit to Supine ?  ?  ?Supine to sit: Min guard, HOB elevated ?Sit to supine: Min assist, HOB elevated ?  ?General bed mobility comments: Assist for LEs back onto bed. Increased time. Cues provided. ?  ? ?Transfers ?  ?  ?  ?  ?  ?  ?  ?  ?  ?  ?  ? ?  ?Balance Overall balance assessment: Needs assistance, History of Falls ?Sitting-balance support: No upper extremity supported ?Sitting balance-Leahy Scale: Fair ?  ?  ?Standing balance support: Bilateral upper extremity supported, During functional activity, Reliant on assistive device for balance ?Standing balance-Leahy Scale: Poor ?Standing balance comment: noted to have posterior leaning when BUE are off walker. ?  ?  ?  ?  ?  ?  ?  ?  ?  ?  ?  ?   ? ?ADL either performed or assessed with clinical judgement  ? ?ADL Overall ADL's : Needs assistance/impaired ?Eating/Feeding: Set up;Sitting ?  ?Grooming: Wash/dry face;Standing;Min guard ?Grooming Details (indicate cue type and reason): with RW wtih some posterior leaning noted. ?Upper Body Bathing: Minimal assistance;Cueing for sequencing;Cueing for safety;Sitting ?  ?Lower Body Bathing: Maximal assistance;Sitting/lateral leans;Bed level ?  ?Upper Body Dressing : Minimal assistance;Sitting ?  ?Lower Body Dressing: Maximal assistance;Sitting/lateral leans;Bed level ?  ?Toilet Transfer: Moderate assistance ?Toilet Transfer Details (indicate cue type and reason): for sit to stand off low toilet in room in Ed with max multimodal cues for proper hand positioning. ?  Toileting- Clothing Manipulation and Hygiene: Moderate assistance;Sit to/from stand ?Toileting - Clothing Manipulation Details  (indicate cue type and reason): with noter posterior leaning. ?  ?  ?Functional mobility during ADLs: Minimal assistance;Rolling walker (2 wheels) ?General ADL Comments: with increased time for all tasks.  ? ? ? ?Vision Baseline Vision/History: 1 Wears glasses ?Vision Assessment?: No apparent visual deficits  ?   ?Perception   ?  ?Praxis   ?  ? ?Pertinent Vitals/Pain Pain Assessment ?Pain Assessment: Faces ?Faces Pain Scale: Hurts a little bit ?Pain Location: LEs when walking ?Pain Descriptors / Indicators: Discomfort, Sore ?Pain Intervention(s): Limited activity within patient's tolerance, Monitored during session, Repositioned  ? ? ? ?Hand Dominance Right ?  ?Extremity/Trunk Assessment Upper Extremity Assessment ?Upper Extremity Assessment: Overall WFL for tasks assessed ?  ?Lower Extremity Assessment ?Lower Extremity Assessment: Defer to PT evaluation ?  ?Cervical / Trunk Assessment ?Cervical / Trunk Assessment: Kyphotic ?  ?Communication Communication ?Communication: HOH ?  ?Cognition Arousal/Alertness: Awake/alert ?Behavior During Therapy: [facility] for tasks assessed/performed ?Overall Cognitive Status: History of cognitive impairments - at baseline ?  ?  ?  ?  ?  ?  ?  ?  ?  ?  ?  ?  ?  ?  ?  ?  ?General Comments: pleasant, cooperative, noted to have confusion with sequencing of tasks with no carryover of education from one task to the next. ?  ?  ?General Comments    ? ?  ?Exercises   ?  ?Shoulder Instructions    ? ? ?Home Living Family/patient expects to be discharged to:: Unsure ?Living Arrangements: Alone ?Available Help at Discharge: Family;Available PRN/intermittently ?Type of Home: House ?Home Access: Level entry ?  ?  ?Home Layout: One level ?  ?  ?  ?  ?  ?  ?  ?Home Equipment: Rollator (4 wheels);Shower seat - built in ?  ?  ?  ? ?  ?Prior Functioning/Environment Prior Level of Function : Independent/Modified Independent ?  ?  ?  ?  ?  ?  ?  ?ADLs Comments: info taken from chart. patient was noted to have  some confusion on this date. ?  ? ?  ?  ?OT Problem List: Impaired balance (sitting and/or standing);Decreased knowledge of use of DME or AE;Decreased knowledge of precautions;Decreased safety awareness;Decreased activity tolerance ?  ?   ?OT Treatment/Interventions: Self-care/ADL training;Therapeutic exercise;Neuromuscular education;Energy conservation;DME and/or AE instruction;Patient/family education;Therapeutic activities;Balance training  ?  ?OT Goals(Current goals can be found in the care plan section) Acute Rehab OT Goals ?Patient Stated Goal: to walk ?OT Goal Formulation: Patient unable to participate in goal setting ?Time For Goal Achievement: 03/20/22 ?Potential to Achieve Goals: Good  ?OT Frequency: Min 2X/week ?  ? ?Co-evaluation   ?  ?  ?  ?  ? ?  ?AM-PAC OT "6 Clicks" Daily Activity     ?Outcome Measure Help from another person eating meals?: A Little ?Help from another person taking care of personal grooming?: A Little ?Help from another person toileting, which includes using toliet, bedpan, or urinal?: A Lot ?Help from another person bathing (including washing, rinsing, drying)?: A Lot ?Help from another person to put on and taking off regular upper body clothing?: A Little ?Help from another person to put on and taking off regular lower body clothing?: A Lot ?6 Click Score: 15 ?  ?End of Session Equipment Utilized During Treatment: Rolling walker (2 wheels) ?Nurse Communication: Other (comment) (ok to see patient) ? ?Activity Tolerance: Patient  tolerated treatment well ?Patient left: in bed;with call bell/phone within reach;with nursing/sitter in room;with bed alarm set ? ?OT Visit Diagnosis: Unsteadiness on feet (R26.81);Other abnormalities of gait and mobility (R26.89);Muscle weakness (generalized) (M62.81)  ?              ?Time: 4383-8184 ?OT Time Calculation (min): 21 min ?Charges:  OT General Charges ?$OT Visit: 1 Visit ?OT Evaluation ?$OT Eval Low Complexity: 1 Low ? ?Tamirah George OTR/L,  MS ?Acute Rehabilitation Department ?Office# [phone] ?Pager# [phone] ? ? ?Feliz Beam Sie Formisano ?03/06/2022, 9:34 AM ?

## 2022-03-07 DIAGNOSIS — M6281 Muscle weakness (generalized): Secondary | ICD-10-CM | POA: Diagnosis not present

## 2022-03-07 DIAGNOSIS — E559 Vitamin D deficiency, unspecified: Secondary | ICD-10-CM | POA: Diagnosis not present

## 2022-03-07 DIAGNOSIS — Z743 Need for continuous supervision: Secondary | ICD-10-CM | POA: Diagnosis not present

## 2022-03-07 DIAGNOSIS — R404 Transient alteration of awareness: Secondary | ICD-10-CM | POA: Diagnosis not present

## 2022-03-07 DIAGNOSIS — R296 Repeated falls: Secondary | ICD-10-CM | POA: Diagnosis not present

## 2022-03-07 DIAGNOSIS — N189 Chronic kidney disease, unspecified: Secondary | ICD-10-CM | POA: Diagnosis not present

## 2022-03-07 DIAGNOSIS — K219 Gastro-esophageal reflux disease without esophagitis: Secondary | ICD-10-CM | POA: Diagnosis not present

## 2022-03-07 DIAGNOSIS — Z79899 Other long term (current) drug therapy: Secondary | ICD-10-CM | POA: Diagnosis not present

## 2022-03-07 DIAGNOSIS — Z20822 Contact with and (suspected) exposure to covid-19: Secondary | ICD-10-CM | POA: Diagnosis not present

## 2022-03-07 DIAGNOSIS — S32591A Other specified fracture of right pubis, initial encounter for closed fracture: Secondary | ICD-10-CM | POA: Diagnosis not present

## 2022-03-07 DIAGNOSIS — I129 Hypertensive chronic kidney disease with stage 1 through stage 4 chronic kidney disease, or unspecified chronic kidney disease: Secondary | ICD-10-CM | POA: Diagnosis not present

## 2022-03-07 DIAGNOSIS — K269 Duodenal ulcer, unspecified as acute or chronic, without hemorrhage or perforation: Secondary | ICD-10-CM | POA: Diagnosis not present

## 2022-03-07 DIAGNOSIS — W19XXXA Unspecified fall, initial encounter: Secondary | ICD-10-CM | POA: Diagnosis not present

## 2022-03-07 DIAGNOSIS — I10 Essential (primary) hypertension: Secondary | ICD-10-CM | POA: Diagnosis not present

## 2022-03-07 DIAGNOSIS — S32591D Other specified fracture of right pubis, subsequent encounter for fracture with routine healing: Secondary | ICD-10-CM | POA: Diagnosis not present

## 2022-03-07 DIAGNOSIS — G459 Transient cerebral ischemic attack, unspecified: Secondary | ICD-10-CM | POA: Diagnosis not present

## 2022-03-07 DIAGNOSIS — R2681 Unsteadiness on feet: Secondary | ICD-10-CM | POA: Diagnosis not present

## 2022-03-07 DIAGNOSIS — Z8616 Personal history of COVID-19: Secondary | ICD-10-CM | POA: Diagnosis not present

## 2022-03-07 DIAGNOSIS — E785 Hyperlipidemia, unspecified: Secondary | ICD-10-CM | POA: Diagnosis not present

## 2022-03-07 DIAGNOSIS — Z9181 History of falling: Secondary | ICD-10-CM | POA: Diagnosis not present

## 2022-03-07 DIAGNOSIS — N390 Urinary tract infection, site not specified: Secondary | ICD-10-CM | POA: Diagnosis not present

## 2022-03-07 DIAGNOSIS — R4701 Aphasia: Secondary | ICD-10-CM | POA: Diagnosis not present

## 2022-03-07 DIAGNOSIS — S37099D Other injury of unspecified kidney, subsequent encounter: Secondary | ICD-10-CM | POA: Diagnosis not present

## 2022-03-07 DIAGNOSIS — Z85038 Personal history of other malignant neoplasm of large intestine: Secondary | ICD-10-CM | POA: Diagnosis not present

## 2022-03-07 DIAGNOSIS — Z7401 Bed confinement status: Secondary | ICD-10-CM | POA: Diagnosis not present

## 2022-03-07 MED ORDER — CEPHALEXIN 500 MG PO CAPS: 500.0000 mg | ORAL_CAPSULE | Freq: Three times a day (TID) | ORAL | 0 refills | Status: DC

## 2022-03-07 NOTE — Progress Notes (Addendum)
RNCM provided authorization # [id number], to Amanda with [facility]. ? ? ?Patient will be assigned to room# 126A, report can be called to [phone] ? ?Shirlee Limerick at [facility] is awaiting AVS.  ?

## 2022-03-07 NOTE — Progress Notes (Signed)
Auth was approved.  ? ?[name].[name], MSW, [name] ?[name]  Transitions of Care ?Clinical Social Worker I ?Direct Dial: [phone]  Fax: [phone] ?[name]  ?

## 2022-03-07 NOTE — ED Provider Notes (Signed)
Emergency Medicine Observation Re-evaluation Note ? ?[name] is a 88 y.o. female, seen on rounds today.  Pt initially presented to the ED for complaints of IVC, Fall, and Homicidal ?Patient noted to have left nondisplaced fracture of the superior pubic rami, nonoperative. ?She also was found to have Klebsiella UTI and is on antibiotic. ?Patient has been psychiatrically cleared. ? ?Currently, the patient is resting comfortably. ? ?Physical Exam  ?BP (!) 168/88 (BP Location: Right Arm)   Pulse 70   Temp (!) 97.4 ?F (36.3 ?C) (Oral)   Resp 16   Ht '5\' 3"'$  (1.6 m)   Wt 52.2 kg   SpO2 100%   BMI 20.37 kg/m?  ?Physical Exam ?General: No acute distress ? ? ?ED Course / MDM  ?EKG:EKG Interpretation ? ?Date/Time:  Sunday March 03 2022 13:10:27 EDT ?Ventricular Rate:  65 ?PR Interval:  166 ?QRS Duration: 86 ?QT Interval:  438 ?QTC Calculation: 456 ?R Axis:   65 ?Text Interpretation: Sinus rhythm Multiple ventricular premature complexes Anterior infarct, old No significant change since last tracing Confirmed by Gareth Morgan [phone]) on 03/03/2022 2:53:14 PM ? ?I have reviewed the labs performed to date as well as medications administered while in observation.  Recent changes in the last 24 hours include : No acute events. ? ?Plan  ?Current plan is for patient to be discharged to [facility]. ? [name] is not under involuntary commitment. ? ? ?  ?Varney Biles, MD ?03/07/22 1303 ? ?

## 2022-03-07 NOTE — ED Notes (Signed)
Patient off unit to facility per provider. Patient calm, cooperative, confused, no s/s of distress. DC information given to patient. Belongings given to staff of [facility] for transportation.  Patient off unit on stretcher, Patient transported by [facility]. ?

## 2022-03-08 DIAGNOSIS — I129 Hypertensive chronic kidney disease with stage 1 through stage 4 chronic kidney disease, or unspecified chronic kidney disease: Secondary | ICD-10-CM | POA: Diagnosis not present

## 2022-03-08 DIAGNOSIS — E559 Vitamin D deficiency, unspecified: Secondary | ICD-10-CM | POA: Diagnosis not present

## 2022-03-08 DIAGNOSIS — R4701 Aphasia: Secondary | ICD-10-CM | POA: Diagnosis not present

## 2022-03-08 DIAGNOSIS — E785 Hyperlipidemia, unspecified: Secondary | ICD-10-CM | POA: Diagnosis not present

## 2022-03-08 DIAGNOSIS — K219 Gastro-esophageal reflux disease without esophagitis: Secondary | ICD-10-CM | POA: Diagnosis not present

## 2022-03-14 DIAGNOSIS — N390 Urinary tract infection, site not specified: Secondary | ICD-10-CM | POA: Diagnosis not present

## 2022-03-14 DIAGNOSIS — E785 Hyperlipidemia, unspecified: Secondary | ICD-10-CM | POA: Diagnosis not present

## 2022-03-14 DIAGNOSIS — K219 Gastro-esophageal reflux disease without esophagitis: Secondary | ICD-10-CM | POA: Diagnosis not present

## 2022-03-14 DIAGNOSIS — R4701 Aphasia: Secondary | ICD-10-CM | POA: Diagnosis not present

## 2022-03-14 DIAGNOSIS — E559 Vitamin D deficiency, unspecified: Secondary | ICD-10-CM | POA: Diagnosis not present

## 2022-03-18 NOTE — Patient Outreach (Signed)
Received a Nurse call referral for [name].   ?I have assigned Raina Mina, RN to call for follow up and determine if there are any Case Management needs.  ?  ?Arville Care, CBCS, CMAA ?[place] Management Assistant ?[facility] ?[phone]   ?

## 2022-03-19 ENCOUNTER — Other Ambulatory Visit: Payer: Self-pay | Admitting: *Deleted

## 2022-03-19 NOTE — Patient Outreach (Signed)
[facility]) Care Management ? ?03/19/2022 ? ?[place] ?01-08-1932 ?[id number] ? ? ?Telephone Assessment-Successful ? ?Request notification to call this pt's POA-daughter Electa Sniff. RN verified credentials and explained the purpose for today's call. Further explained [facility] services/roles related to RN case Freight forwarder, social workers and pharmacy. Daughter explained situation of her mother who has advance stages of Dementia. Details were explained and daughter reports pt is currently in [place] facility but needs additional placement for a memory care unit. Daughter reports Education officer, museum at the facility [employer]) has been working with the daughter on finding an appropriate memory care facility. RN explained [facility] works with patient that are in the community/residential patients with available services. Due to pt being in a facility daughter informed she must work with the team at the facility for a safe placement for the pt. ? ?Daughter very appreciative and grateful for the information provided and will follow up with the facility for placement. No other inquires and all questions answered. Case will not be opened due to pt is not eligible for [facility] while in the facility.  ? ?No follow ups needed at this time. ? ?Raina Mina, RN ?Care Management Coordinator ?[place] ?Main Office [phone]  ?

## 2022-03-21 DIAGNOSIS — R262 Difficulty in walking, not elsewhere classified: Secondary | ICD-10-CM | POA: Diagnosis not present

## 2022-03-21 DIAGNOSIS — S32511A Fracture of superior rim of right pubis, initial encounter for closed fracture: Secondary | ICD-10-CM | POA: Diagnosis not present

## 2022-03-21 DIAGNOSIS — R2689 Other abnormalities of gait and mobility: Secondary | ICD-10-CM | POA: Diagnosis not present

## 2022-03-21 DIAGNOSIS — M6281 Muscle weakness (generalized): Secondary | ICD-10-CM | POA: Diagnosis not present

## 2022-03-21 DIAGNOSIS — S32591A Other specified fracture of right pubis, initial encounter for closed fracture: Secondary | ICD-10-CM | POA: Diagnosis not present

## 2022-03-22 DIAGNOSIS — E785 Hyperlipidemia, unspecified: Secondary | ICD-10-CM | POA: Diagnosis not present

## 2022-03-22 DIAGNOSIS — S32591A Other specified fracture of right pubis, initial encounter for closed fracture: Secondary | ICD-10-CM | POA: Diagnosis not present

## 2022-03-22 DIAGNOSIS — R262 Difficulty in walking, not elsewhere classified: Secondary | ICD-10-CM | POA: Diagnosis not present

## 2022-03-22 DIAGNOSIS — E559 Vitamin D deficiency, unspecified: Secondary | ICD-10-CM | POA: Diagnosis not present

## 2022-03-22 DIAGNOSIS — R4701 Aphasia: Secondary | ICD-10-CM | POA: Diagnosis not present

## 2022-03-22 DIAGNOSIS — S32511A Fracture of superior rim of right pubis, initial encounter for closed fracture: Secondary | ICD-10-CM | POA: Diagnosis not present

## 2022-03-22 DIAGNOSIS — R2689 Other abnormalities of gait and mobility: Secondary | ICD-10-CM | POA: Diagnosis not present

## 2022-03-22 DIAGNOSIS — N390 Urinary tract infection, site not specified: Secondary | ICD-10-CM | POA: Diagnosis not present

## 2022-03-22 DIAGNOSIS — M6281 Muscle weakness (generalized): Secondary | ICD-10-CM | POA: Diagnosis not present

## 2022-03-23 DIAGNOSIS — M6281 Muscle weakness (generalized): Secondary | ICD-10-CM | POA: Diagnosis not present

## 2022-03-23 DIAGNOSIS — R2689 Other abnormalities of gait and mobility: Secondary | ICD-10-CM | POA: Diagnosis not present

## 2022-03-23 DIAGNOSIS — S32591A Other specified fracture of right pubis, initial encounter for closed fracture: Secondary | ICD-10-CM | POA: Diagnosis not present

## 2022-03-23 DIAGNOSIS — R262 Difficulty in walking, not elsewhere classified: Secondary | ICD-10-CM | POA: Diagnosis not present

## 2022-03-23 DIAGNOSIS — S32511A Fracture of superior rim of right pubis, initial encounter for closed fracture: Secondary | ICD-10-CM | POA: Diagnosis not present

## 2022-03-24 DIAGNOSIS — S32511A Fracture of superior rim of right pubis, initial encounter for closed fracture: Secondary | ICD-10-CM | POA: Diagnosis not present

## 2022-03-24 DIAGNOSIS — R262 Difficulty in walking, not elsewhere classified: Secondary | ICD-10-CM | POA: Diagnosis not present

## 2022-03-24 DIAGNOSIS — R2689 Other abnormalities of gait and mobility: Secondary | ICD-10-CM | POA: Diagnosis not present

## 2022-03-24 DIAGNOSIS — S32591A Other specified fracture of right pubis, initial encounter for closed fracture: Secondary | ICD-10-CM | POA: Diagnosis not present

## 2022-03-24 DIAGNOSIS — M6281 Muscle weakness (generalized): Secondary | ICD-10-CM | POA: Diagnosis not present

## 2022-03-25 DIAGNOSIS — R2689 Other abnormalities of gait and mobility: Secondary | ICD-10-CM | POA: Diagnosis not present

## 2022-03-25 DIAGNOSIS — S32511A Fracture of superior rim of right pubis, initial encounter for closed fracture: Secondary | ICD-10-CM | POA: Diagnosis not present

## 2022-03-25 DIAGNOSIS — S32591A Other specified fracture of right pubis, initial encounter for closed fracture: Secondary | ICD-10-CM | POA: Diagnosis not present

## 2022-03-25 DIAGNOSIS — R262 Difficulty in walking, not elsewhere classified: Secondary | ICD-10-CM | POA: Diagnosis not present

## 2022-03-25 DIAGNOSIS — M6281 Muscle weakness (generalized): Secondary | ICD-10-CM | POA: Diagnosis not present

## 2022-03-26 DIAGNOSIS — M6281 Muscle weakness (generalized): Secondary | ICD-10-CM | POA: Diagnosis not present

## 2022-03-26 DIAGNOSIS — S32511A Fracture of superior rim of right pubis, initial encounter for closed fracture: Secondary | ICD-10-CM | POA: Diagnosis not present

## 2022-03-26 DIAGNOSIS — R262 Difficulty in walking, not elsewhere classified: Secondary | ICD-10-CM | POA: Diagnosis not present

## 2022-03-26 DIAGNOSIS — I10 Essential (primary) hypertension: Secondary | ICD-10-CM | POA: Diagnosis not present

## 2022-03-26 DIAGNOSIS — S32591A Other specified fracture of right pubis, initial encounter for closed fracture: Secondary | ICD-10-CM | POA: Diagnosis not present

## 2022-03-26 DIAGNOSIS — R2689 Other abnormalities of gait and mobility: Secondary | ICD-10-CM | POA: Diagnosis not present

## 2022-03-27 DIAGNOSIS — S32591A Other specified fracture of right pubis, initial encounter for closed fracture: Secondary | ICD-10-CM | POA: Diagnosis not present

## 2022-03-27 DIAGNOSIS — R262 Difficulty in walking, not elsewhere classified: Secondary | ICD-10-CM | POA: Diagnosis not present

## 2022-03-27 DIAGNOSIS — M25569 Pain in unspecified knee: Secondary | ICD-10-CM | POA: Diagnosis not present

## 2022-03-27 DIAGNOSIS — R2689 Other abnormalities of gait and mobility: Secondary | ICD-10-CM | POA: Diagnosis not present

## 2022-03-27 DIAGNOSIS — S32511A Fracture of superior rim of right pubis, initial encounter for closed fracture: Secondary | ICD-10-CM | POA: Diagnosis not present

## 2022-03-27 DIAGNOSIS — M6281 Muscle weakness (generalized): Secondary | ICD-10-CM | POA: Diagnosis not present

## 2022-03-28 DIAGNOSIS — R2689 Other abnormalities of gait and mobility: Secondary | ICD-10-CM | POA: Diagnosis not present

## 2022-03-28 DIAGNOSIS — M6281 Muscle weakness (generalized): Secondary | ICD-10-CM | POA: Diagnosis not present

## 2022-03-28 DIAGNOSIS — R262 Difficulty in walking, not elsewhere classified: Secondary | ICD-10-CM | POA: Diagnosis not present

## 2022-03-28 DIAGNOSIS — S32511A Fracture of superior rim of right pubis, initial encounter for closed fracture: Secondary | ICD-10-CM | POA: Diagnosis not present

## 2022-03-28 DIAGNOSIS — S32591A Other specified fracture of right pubis, initial encounter for closed fracture: Secondary | ICD-10-CM | POA: Diagnosis not present

## 2022-03-29 DIAGNOSIS — S32511A Fracture of superior rim of right pubis, initial encounter for closed fracture: Secondary | ICD-10-CM | POA: Diagnosis not present

## 2022-03-29 DIAGNOSIS — R296 Repeated falls: Secondary | ICD-10-CM | POA: Diagnosis not present

## 2022-03-29 DIAGNOSIS — E785 Hyperlipidemia, unspecified: Secondary | ICD-10-CM | POA: Diagnosis not present

## 2022-03-29 DIAGNOSIS — R4701 Aphasia: Secondary | ICD-10-CM | POA: Diagnosis not present

## 2022-03-29 DIAGNOSIS — I10 Essential (primary) hypertension: Secondary | ICD-10-CM | POA: Diagnosis not present

## 2022-03-29 DIAGNOSIS — E559 Vitamin D deficiency, unspecified: Secondary | ICD-10-CM | POA: Diagnosis not present

## 2022-03-29 DIAGNOSIS — R2689 Other abnormalities of gait and mobility: Secondary | ICD-10-CM | POA: Diagnosis not present

## 2022-03-29 DIAGNOSIS — R262 Difficulty in walking, not elsewhere classified: Secondary | ICD-10-CM | POA: Diagnosis not present

## 2022-03-29 DIAGNOSIS — S32591A Other specified fracture of right pubis, initial encounter for closed fracture: Secondary | ICD-10-CM | POA: Diagnosis not present

## 2022-03-29 DIAGNOSIS — K219 Gastro-esophageal reflux disease without esophagitis: Secondary | ICD-10-CM | POA: Diagnosis not present

## 2022-03-29 DIAGNOSIS — M6281 Muscle weakness (generalized): Secondary | ICD-10-CM | POA: Diagnosis not present

## 2022-03-29 DIAGNOSIS — N390 Urinary tract infection, site not specified: Secondary | ICD-10-CM | POA: Diagnosis not present

## 2022-03-29 DIAGNOSIS — I129 Hypertensive chronic kidney disease with stage 1 through stage 4 chronic kidney disease, or unspecified chronic kidney disease: Secondary | ICD-10-CM | POA: Diagnosis not present

## 2022-03-31 DIAGNOSIS — S32591A Other specified fracture of right pubis, initial encounter for closed fracture: Secondary | ICD-10-CM | POA: Diagnosis not present

## 2022-03-31 DIAGNOSIS — M6281 Muscle weakness (generalized): Secondary | ICD-10-CM | POA: Diagnosis not present

## 2022-03-31 DIAGNOSIS — S32511A Fracture of superior rim of right pubis, initial encounter for closed fracture: Secondary | ICD-10-CM | POA: Diagnosis not present

## 2022-03-31 DIAGNOSIS — R2689 Other abnormalities of gait and mobility: Secondary | ICD-10-CM | POA: Diagnosis not present

## 2022-03-31 DIAGNOSIS — R262 Difficulty in walking, not elsewhere classified: Secondary | ICD-10-CM | POA: Diagnosis not present

## 2022-04-01 DIAGNOSIS — S32511A Fracture of superior rim of right pubis, initial encounter for closed fracture: Secondary | ICD-10-CM | POA: Diagnosis not present

## 2022-04-01 DIAGNOSIS — S32591A Other specified fracture of right pubis, initial encounter for closed fracture: Secondary | ICD-10-CM | POA: Diagnosis not present

## 2022-04-01 DIAGNOSIS — M6281 Muscle weakness (generalized): Secondary | ICD-10-CM | POA: Diagnosis not present

## 2022-04-01 DIAGNOSIS — R262 Difficulty in walking, not elsewhere classified: Secondary | ICD-10-CM | POA: Diagnosis not present

## 2022-04-01 DIAGNOSIS — R2689 Other abnormalities of gait and mobility: Secondary | ICD-10-CM | POA: Diagnosis not present

## 2022-04-02 DIAGNOSIS — S32591A Other specified fracture of right pubis, initial encounter for closed fracture: Secondary | ICD-10-CM | POA: Diagnosis not present

## 2022-04-02 DIAGNOSIS — S32511A Fracture of superior rim of right pubis, initial encounter for closed fracture: Secondary | ICD-10-CM | POA: Diagnosis not present

## 2022-04-02 DIAGNOSIS — R262 Difficulty in walking, not elsewhere classified: Secondary | ICD-10-CM | POA: Diagnosis not present

## 2022-04-02 DIAGNOSIS — R2689 Other abnormalities of gait and mobility: Secondary | ICD-10-CM | POA: Diagnosis not present

## 2022-04-02 DIAGNOSIS — M6281 Muscle weakness (generalized): Secondary | ICD-10-CM | POA: Diagnosis not present

## 2022-04-03 DIAGNOSIS — R262 Difficulty in walking, not elsewhere classified: Secondary | ICD-10-CM | POA: Diagnosis not present

## 2022-04-03 DIAGNOSIS — S32511A Fracture of superior rim of right pubis, initial encounter for closed fracture: Secondary | ICD-10-CM | POA: Diagnosis not present

## 2022-04-03 DIAGNOSIS — R2689 Other abnormalities of gait and mobility: Secondary | ICD-10-CM | POA: Diagnosis not present

## 2022-04-03 DIAGNOSIS — S32591A Other specified fracture of right pubis, initial encounter for closed fracture: Secondary | ICD-10-CM | POA: Diagnosis not present

## 2022-04-03 DIAGNOSIS — M6281 Muscle weakness (generalized): Secondary | ICD-10-CM | POA: Diagnosis not present

## 2022-04-04 DIAGNOSIS — S32511A Fracture of superior rim of right pubis, initial encounter for closed fracture: Secondary | ICD-10-CM | POA: Diagnosis not present

## 2022-04-04 DIAGNOSIS — M6281 Muscle weakness (generalized): Secondary | ICD-10-CM | POA: Diagnosis not present

## 2022-04-04 DIAGNOSIS — R262 Difficulty in walking, not elsewhere classified: Secondary | ICD-10-CM | POA: Diagnosis not present

## 2022-04-04 DIAGNOSIS — S32591A Other specified fracture of right pubis, initial encounter for closed fracture: Secondary | ICD-10-CM | POA: Diagnosis not present

## 2022-04-04 DIAGNOSIS — R2689 Other abnormalities of gait and mobility: Secondary | ICD-10-CM | POA: Diagnosis not present

## 2022-04-05 DIAGNOSIS — R2689 Other abnormalities of gait and mobility: Secondary | ICD-10-CM | POA: Diagnosis not present

## 2022-04-05 DIAGNOSIS — R296 Repeated falls: Secondary | ICD-10-CM | POA: Diagnosis not present

## 2022-04-05 DIAGNOSIS — R262 Difficulty in walking, not elsewhere classified: Secondary | ICD-10-CM | POA: Diagnosis not present

## 2022-04-05 DIAGNOSIS — S32591A Other specified fracture of right pubis, initial encounter for closed fracture: Secondary | ICD-10-CM | POA: Diagnosis not present

## 2022-04-05 DIAGNOSIS — I10 Essential (primary) hypertension: Secondary | ICD-10-CM | POA: Diagnosis not present

## 2022-04-05 DIAGNOSIS — K219 Gastro-esophageal reflux disease without esophagitis: Secondary | ICD-10-CM | POA: Diagnosis not present

## 2022-04-05 DIAGNOSIS — G894 Chronic pain syndrome: Secondary | ICD-10-CM | POA: Diagnosis not present

## 2022-04-05 DIAGNOSIS — S32511A Fracture of superior rim of right pubis, initial encounter for closed fracture: Secondary | ICD-10-CM | POA: Diagnosis not present

## 2022-04-05 DIAGNOSIS — M6281 Muscle weakness (generalized): Secondary | ICD-10-CM | POA: Diagnosis not present

## 2022-04-05 DIAGNOSIS — E785 Hyperlipidemia, unspecified: Secondary | ICD-10-CM | POA: Diagnosis not present

## 2022-04-05 DIAGNOSIS — E559 Vitamin D deficiency, unspecified: Secondary | ICD-10-CM | POA: Diagnosis not present

## 2022-04-05 DIAGNOSIS — I129 Hypertensive chronic kidney disease with stage 1 through stage 4 chronic kidney disease, or unspecified chronic kidney disease: Secondary | ICD-10-CM | POA: Diagnosis not present

## 2022-04-05 DIAGNOSIS — R531 Weakness: Secondary | ICD-10-CM | POA: Diagnosis not present

## 2022-04-08 ENCOUNTER — Telehealth: Payer: Self-pay

## 2022-04-08 DIAGNOSIS — G3184 Mild cognitive impairment, so stated: Secondary | ICD-10-CM | POA: Diagnosis not present

## 2022-04-08 DIAGNOSIS — S32511A Fracture of superior rim of right pubis, initial encounter for closed fracture: Secondary | ICD-10-CM | POA: Diagnosis not present

## 2022-04-08 NOTE — Telephone Encounter (Signed)
1220 pm.  Phone call made to daughter [name] to follow up on patient's disposition.  No answer.  Message left requesting a call back.  ?

## 2022-04-09 DIAGNOSIS — S32511A Fracture of superior rim of right pubis, initial encounter for closed fracture: Secondary | ICD-10-CM | POA: Diagnosis not present

## 2022-04-11 DIAGNOSIS — S32511A Fracture of superior rim of right pubis, initial encounter for closed fracture: Secondary | ICD-10-CM | POA: Diagnosis not present

## 2022-04-22 ENCOUNTER — Telehealth: Payer: Self-pay

## 2022-04-22 DIAGNOSIS — R54 Age-related physical debility: Secondary | ICD-10-CM | POA: Diagnosis not present

## 2022-04-22 DIAGNOSIS — Z66 Do not resuscitate: Secondary | ICD-10-CM | POA: Diagnosis not present

## 2022-04-22 DIAGNOSIS — N1832 Chronic kidney disease, stage 3b: Secondary | ICD-10-CM | POA: Diagnosis not present

## 2022-04-22 DIAGNOSIS — D519 Vitamin B12 deficiency anemia, unspecified: Secondary | ICD-10-CM | POA: Diagnosis not present

## 2022-04-22 DIAGNOSIS — I10 Essential (primary) hypertension: Secondary | ICD-10-CM | POA: Diagnosis not present

## 2022-04-22 DIAGNOSIS — F01A4 Vascular dementia, mild, with anxiety: Secondary | ICD-10-CM | POA: Diagnosis not present

## 2022-04-22 DIAGNOSIS — Z8711 Personal history of peptic ulcer disease: Secondary | ICD-10-CM | POA: Diagnosis not present

## 2022-04-22 DIAGNOSIS — M810 Age-related osteoporosis without current pathological fracture: Secondary | ICD-10-CM | POA: Diagnosis not present

## 2022-04-22 NOTE — Telephone Encounter (Signed)
1008 am.  Message received from daughter Electa Sniff who is returning my call from 04/08/22.  Patient is now located at [facility].  Daughter would like to continue with Palliative Care services and possibly counseling.  Update provided to Palliative Care Admin on patient's location and daughter's request.  ?

## 2022-04-26 ENCOUNTER — Non-Acute Institutional Stay: Payer: Medicare Other | Admitting: Family Medicine

## 2022-04-26 VITALS — HR 50

## 2022-04-26 DIAGNOSIS — Z515 Encounter for palliative care: Secondary | ICD-10-CM

## 2022-04-26 NOTE — Progress Notes (Signed)
[facility] received a referral to follow up with patient for chronic disease management in setting of dementia.  Follow up is also to address advance directive planning and defining/refining goals of care. Pt was lying in bed watching tv. She denies pain, falls, trouble with her appetite, nausea/vomiting or constipation.  She says she bathes and dresses herself. She is very HOH and has trouble understanding and with word finding.  She did not understand Palliative Care and was easily frustrated stating she already done this 3 times before and didn't know why this provider would interrupt her "work".  She allowed pulse ox and agreed to have BP checked then said "I thought you were leaving."  Pt was becoming more agitated so this provider left and spoke with the home director to indicate that I would follow up with her family and try again to speak with pt to see if she was more agreeable to follow up.  Damaris Hippo FNP-C

## 2022-05-01 DIAGNOSIS — E559 Vitamin D deficiency, unspecified: Secondary | ICD-10-CM | POA: Diagnosis not present

## 2022-05-01 DIAGNOSIS — E0590 Thyrotoxicosis, unspecified without thyrotoxic crisis or storm: Secondary | ICD-10-CM | POA: Diagnosis not present

## 2022-06-03 DIAGNOSIS — I10 Essential (primary) hypertension: Secondary | ICD-10-CM | POA: Diagnosis not present

## 2022-06-03 DIAGNOSIS — R54 Age-related physical debility: Secondary | ICD-10-CM | POA: Diagnosis not present

## 2022-06-03 DIAGNOSIS — F01A4 Vascular dementia, mild, with anxiety: Secondary | ICD-10-CM | POA: Diagnosis not present

## 2022-06-24 DIAGNOSIS — I10 Essential (primary) hypertension: Secondary | ICD-10-CM | POA: Diagnosis not present

## 2022-06-24 DIAGNOSIS — F01A4 Vascular dementia, mild, with anxiety: Secondary | ICD-10-CM | POA: Diagnosis not present

## 2022-06-26 ENCOUNTER — Non-Acute Institutional Stay: Payer: Medicare Other | Admitting: Family Medicine

## 2022-06-26 VITALS — BP 102/62 | HR 77 | Resp 18

## 2022-06-26 DIAGNOSIS — Z515 Encounter for palliative care: Secondary | ICD-10-CM

## 2022-06-26 DIAGNOSIS — F03B0 Unspecified dementia, moderate, without behavioral disturbance, psychotic disturbance, mood disturbance, and anxiety: Secondary | ICD-10-CM | POA: Diagnosis not present

## 2022-06-26 NOTE — Progress Notes (Signed)
[facility] Consult Note Telephone: [phone]  Fax: [phone]    Date of encounter: 06/26/22 4:06 PM PATIENT NAME: [name] [address] [place] [address]   [phone] (home)  DOB: [birth year] MRN: [id number] PRIMARY CARE PROVIDER:    Jonathon Jordan, MD,  [address] [place] [address] [phone]  REFERRING PROVIDER:   Jonathon Jordan, MD [address] [place],  [place] [address] [phone]  RESPONSIBLE PARTY:    Contact Information     Name Relation Home Work Mobile   Nash,Linda Daughter [phone]  [phone]        I met face to face with patient in her assisted living facility. Palliative Care was asked to follow this patient by consultation request of  Jonathon Jordan, MD to address advance care planning and complex medical decision making. This is a follow up visit.                                   ASSESSMENT, SYMPTOM MANAGEMENT AND PLAN / RECOMMENDATIONS:   Dementia, unspecified type and unspecified behavioral, psychotic and mood disorder FAST 7 score 5 Not currently on any meds for dementia Try to maintain a routine to help pt remain oriented as much as possible.   Palliative Care Encounter Need to follow up with family for goals of care and code status  Advance Care Planning/Goals of Care:  CODE STATUS: Need to address code status and goals of care with family, has full code then DNR then full code listed in [place].    Follow up Palliative Care Visit: Palliative care will continue to follow for complex medical decision making, advance care planning, and clarification of goals. Return 4 weeks or prn.   This visit was coded based on medical decision making (MDM).  PPS: 60%  HOSPICE ELIGIBILITY/DIAGNOSIS: TBD  Chief Complaint:  Palliative Care is following for chronic medical management in setting of dementia with hx of colon  cancer.  HISTORY OF PRESENT ILLNESS:  [name] is a 88 y.o. year old female with dementia with carotid artery stenosis a hx of colon cancer and perforation of bowel/duodenal ulcer with CKD.  Pt seen in her room and denies falls, pain, sob, dysuria, nausea, constipation.  Appetite is fairly good.  Sleeping ok.  No blood in stool. No coughing or choking with eating or drinking.  History obtained from review of EMR, discussion with facility staff and/or Ms. Ament.    I reviewed EMR and no new available labs, imaging, studies or related documents since last Palliative Care Visit.    ROS General: NAD ENMT: denies dysphagia Cardiovascular: denies chest pain, denies DOE Pulmonary: denies cough, denies increased SOB Abdomen: endorses good appetite, denies constipation, endorses continence of bowel GU: denies dysuria, endorses continence of urine MSK:  denies increased weakness,  no falls reported Neurological: denies pain, denies insomnia Psych: Endorses positive mood Heme/lymph/immuno: denies bruises, abnormal bleeding  Physical Exam: Current and past weights: 115 lbs as of 03/03/22 Constitutional: NAD General: thin, WD CV: S1S2, RRR, no LE edema Pulmonary: CTAB, no increased work of breathing, no cough, room air Abdomen: normo-active BS + 4 quadrants, soft and non tender MSK: no sarcopenia, moves all extremities, ambulatory Skin: warm and dry, no rashes or wounds on visible skin Neuro:  no generalized weakness,  noted cognitive impairment Psych: non-anxious affect, A and O  x 2 Hem/lymph/immuno: no widespread bruising   Thank you for the opportunity to participate in the care of [name].  The palliative care team will continue to follow. Please call our office at [phone] if we can be of additional assistance.   Marijo Conception, FNP -C  COVID-19 PATIENT SCREENING TOOL Asked and negative response unless otherwise noted:   Have you had symptoms of covid,  tested positive or been in contact with someone with symptoms/positive test in the past 5-10 days?  Unknown

## 2022-06-28 ENCOUNTER — Encounter: Payer: Self-pay | Admitting: Family Medicine

## 2022-06-28 DIAGNOSIS — F03B0 Unspecified dementia, moderate, without behavioral disturbance, psychotic disturbance, mood disturbance, and anxiety: Secondary | ICD-10-CM | POA: Insufficient documentation

## 2022-07-15 DIAGNOSIS — F01A4 Vascular dementia, mild, with anxiety: Secondary | ICD-10-CM | POA: Diagnosis not present

## 2022-07-15 DIAGNOSIS — G250 Essential tremor: Secondary | ICD-10-CM | POA: Diagnosis not present

## 2022-07-15 DIAGNOSIS — I10 Essential (primary) hypertension: Secondary | ICD-10-CM | POA: Diagnosis not present

## 2022-07-29 DIAGNOSIS — N1832 Chronic kidney disease, stage 3b: Secondary | ICD-10-CM | POA: Diagnosis not present

## 2022-07-29 DIAGNOSIS — I10 Essential (primary) hypertension: Secondary | ICD-10-CM | POA: Diagnosis not present

## 2022-07-29 DIAGNOSIS — F01A4 Vascular dementia, mild, with anxiety: Secondary | ICD-10-CM | POA: Diagnosis not present

## 2022-07-29 DIAGNOSIS — R609 Edema, unspecified: Secondary | ICD-10-CM | POA: Diagnosis not present

## 2022-08-05 DIAGNOSIS — G4701 Insomnia due to medical condition: Secondary | ICD-10-CM | POA: Diagnosis not present

## 2022-08-05 DIAGNOSIS — R609 Edema, unspecified: Secondary | ICD-10-CM | POA: Diagnosis not present

## 2022-08-05 DIAGNOSIS — F01A4 Vascular dementia, mild, with anxiety: Secondary | ICD-10-CM | POA: Diagnosis not present

## 2022-08-05 DIAGNOSIS — I10 Essential (primary) hypertension: Secondary | ICD-10-CM | POA: Diagnosis not present

## 2022-08-05 DIAGNOSIS — N1832 Chronic kidney disease, stage 3b: Secondary | ICD-10-CM | POA: Diagnosis not present

## 2022-08-07 DIAGNOSIS — D519 Vitamin B12 deficiency anemia, unspecified: Secondary | ICD-10-CM | POA: Diagnosis not present

## 2022-08-07 DIAGNOSIS — Z79899 Other long term (current) drug therapy: Secondary | ICD-10-CM | POA: Diagnosis not present

## 2022-08-13 DIAGNOSIS — F01A4 Vascular dementia, mild, with anxiety: Secondary | ICD-10-CM | POA: Diagnosis not present

## 2022-08-13 DIAGNOSIS — I10 Essential (primary) hypertension: Secondary | ICD-10-CM | POA: Diagnosis not present

## 2022-08-13 DIAGNOSIS — Z87440 Personal history of urinary (tract) infections: Secondary | ICD-10-CM | POA: Diagnosis not present

## 2022-08-13 DIAGNOSIS — N1832 Chronic kidney disease, stage 3b: Secondary | ICD-10-CM | POA: Diagnosis not present

## 2022-08-14 DIAGNOSIS — I10 Essential (primary) hypertension: Secondary | ICD-10-CM | POA: Diagnosis not present

## 2022-08-26 DIAGNOSIS — G301 Alzheimer's disease with late onset: Secondary | ICD-10-CM | POA: Diagnosis not present

## 2022-08-26 DIAGNOSIS — I10 Essential (primary) hypertension: Secondary | ICD-10-CM | POA: Diagnosis not present

## 2022-08-26 DIAGNOSIS — R54 Age-related physical debility: Secondary | ICD-10-CM | POA: Diagnosis not present

## 2022-08-26 DIAGNOSIS — N1832 Chronic kidney disease, stage 3b: Secondary | ICD-10-CM | POA: Diagnosis not present

## 2022-08-26 DIAGNOSIS — Z66 Do not resuscitate: Secondary | ICD-10-CM | POA: Diagnosis not present

## 2022-09-02 DIAGNOSIS — I10 Essential (primary) hypertension: Secondary | ICD-10-CM | POA: Diagnosis not present

## 2022-09-02 DIAGNOSIS — F01A4 Vascular dementia, mild, with anxiety: Secondary | ICD-10-CM | POA: Diagnosis not present

## 2022-09-04 DIAGNOSIS — N390 Urinary tract infection, site not specified: Secondary | ICD-10-CM | POA: Diagnosis not present

## 2022-09-09 DIAGNOSIS — R54 Age-related physical debility: Secondary | ICD-10-CM | POA: Diagnosis not present

## 2022-09-09 DIAGNOSIS — N390 Urinary tract infection, site not specified: Secondary | ICD-10-CM | POA: Diagnosis not present

## 2022-09-09 DIAGNOSIS — F01A4 Vascular dementia, mild, with anxiety: Secondary | ICD-10-CM | POA: Diagnosis not present

## 2022-09-09 DIAGNOSIS — I10 Essential (primary) hypertension: Secondary | ICD-10-CM | POA: Diagnosis not present

## 2022-09-10 IMAGING — CT CT PELVIS W/O CM
2 of 3 series · 14 of 46 positions shown, 16 images · non-contrast
Comparison: Lumbar MRI, 04/20/2021. Thoracic and lumbar spine
radiographs, 03/28/2021.

CLINICAL DATA: Trauma.  Fall.  Low back/pelvic pain.

EXAM:
CT THORACIC AND LUMBAR SPINE WITHOUT CONTRAST
CT PELVIS WITHOUT CONTRAST
TECHNIQUE: Multidetector CT imaging of the thoracic and lumbar spine was
performed without contrast. Multiplanar CT image reconstructions
were also generated.

[Series 7: axial st · axial · 0.73mm/px · z∈[-682,-456]mm · 11 of 131 slices shown, 13 images]
[im 9/131  soft-tissue]
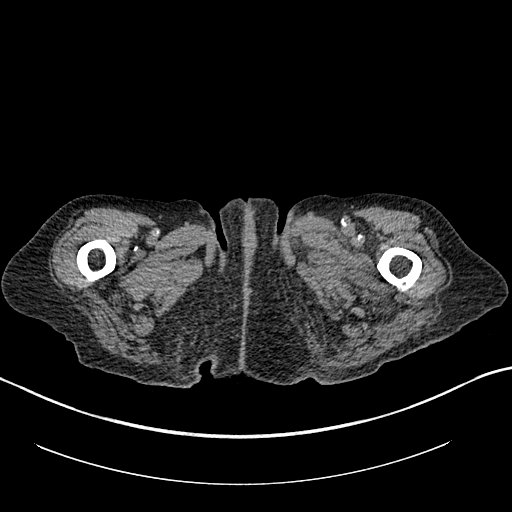
[im 9/131  bone]
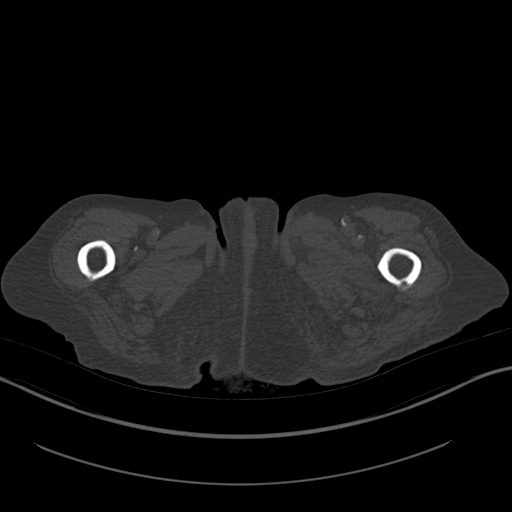
[im 21/131  soft-tissue]
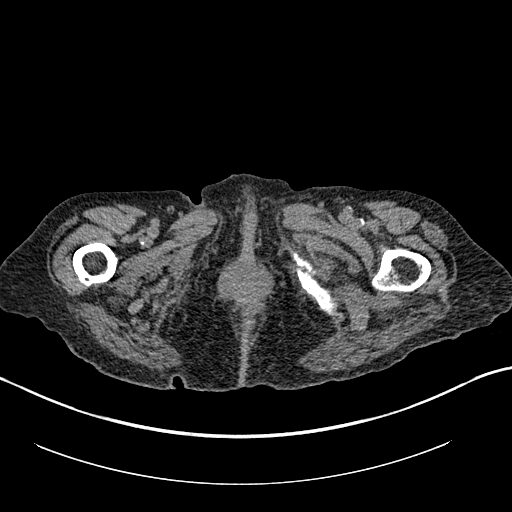
[im 30/131  soft-tissue]
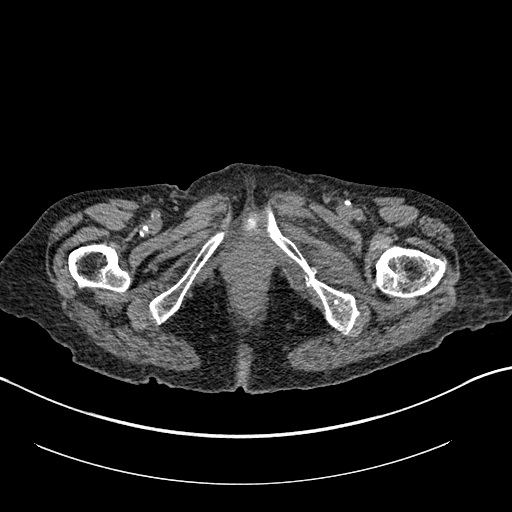
[im 42/131  soft-tissue]
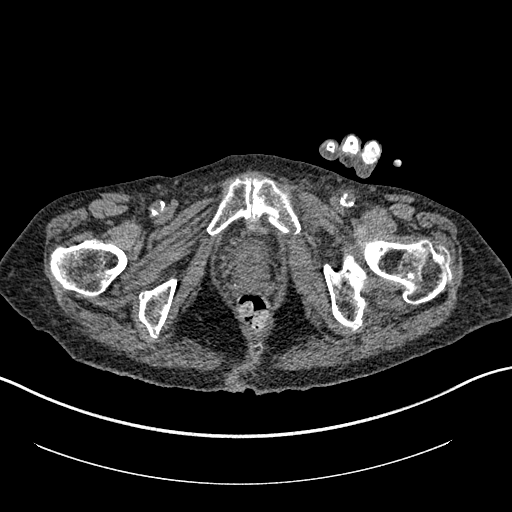
[im 55/131  soft-tissue]
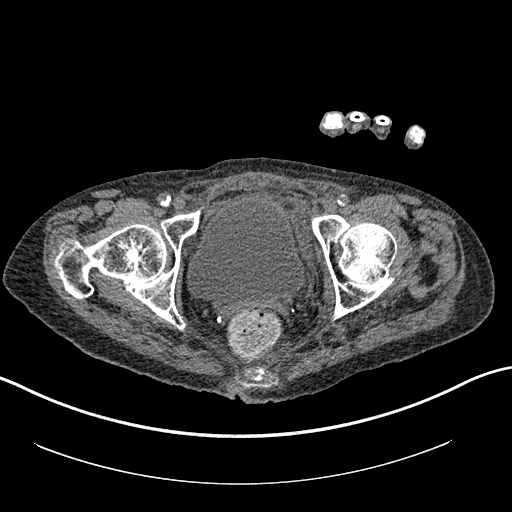
[im 68/131  soft-tissue]
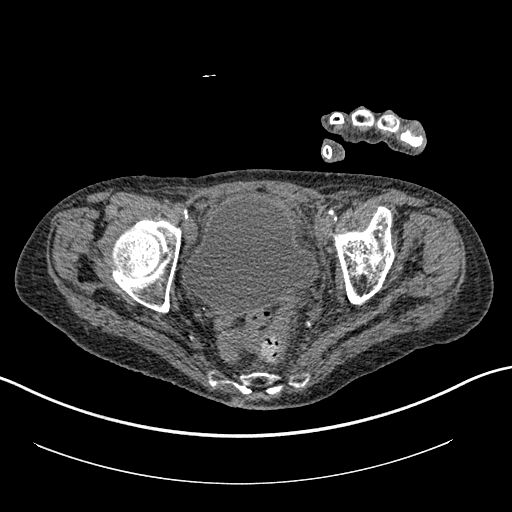
[im 76/131  soft-tissue]
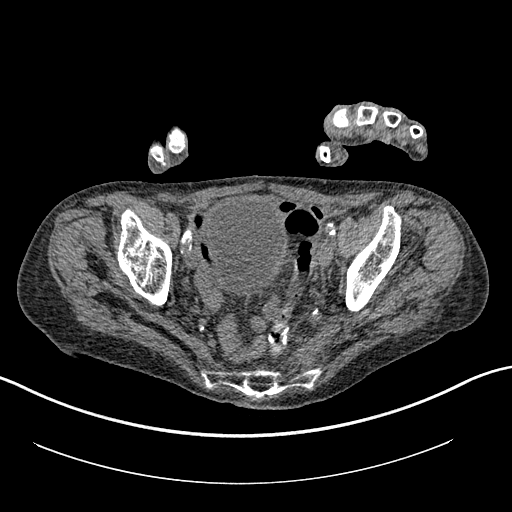
[im 89/131  soft-tissue]
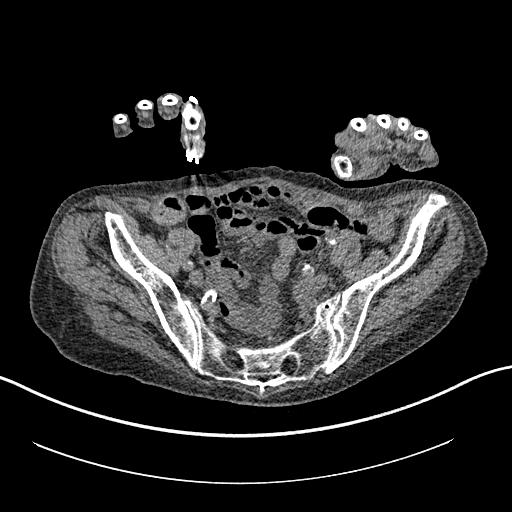
[im 101/131  soft-tissue]
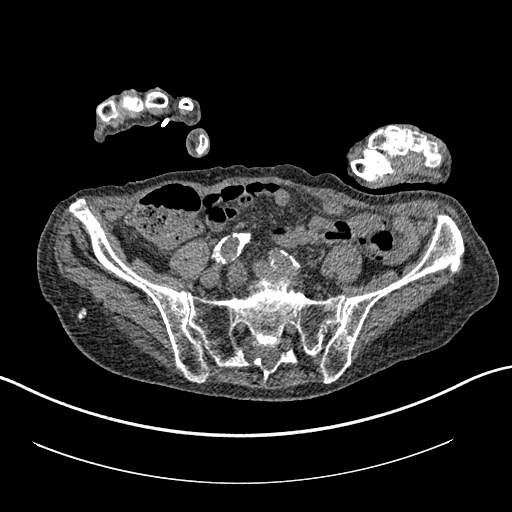
[im 101/131  bone]
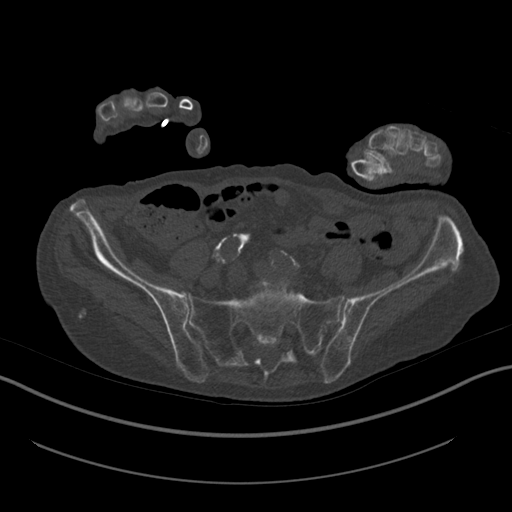
[im 110/131  soft-tissue]
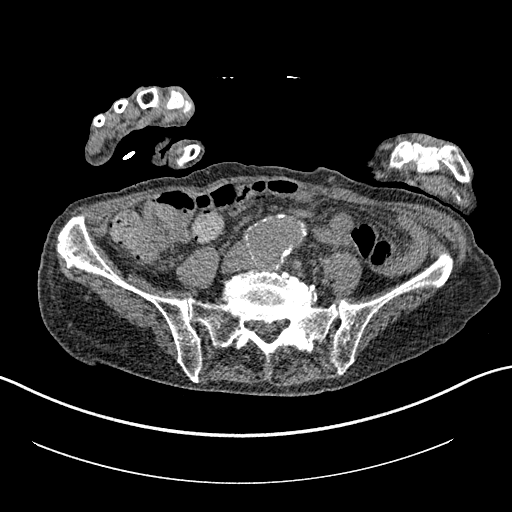
[im 122/131  soft-tissue]
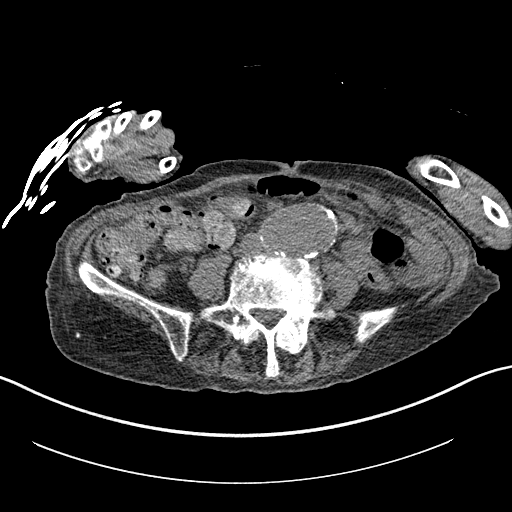

[Series 12: coronal st · coronal · 0.52mm/px · 3 of 105 slices shown]
[im 35/105  soft-tissue]
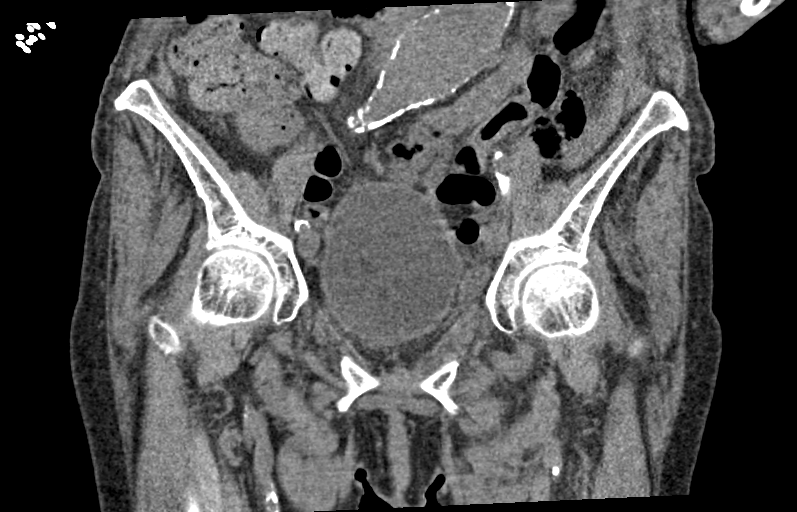
[im 47/105  soft-tissue]
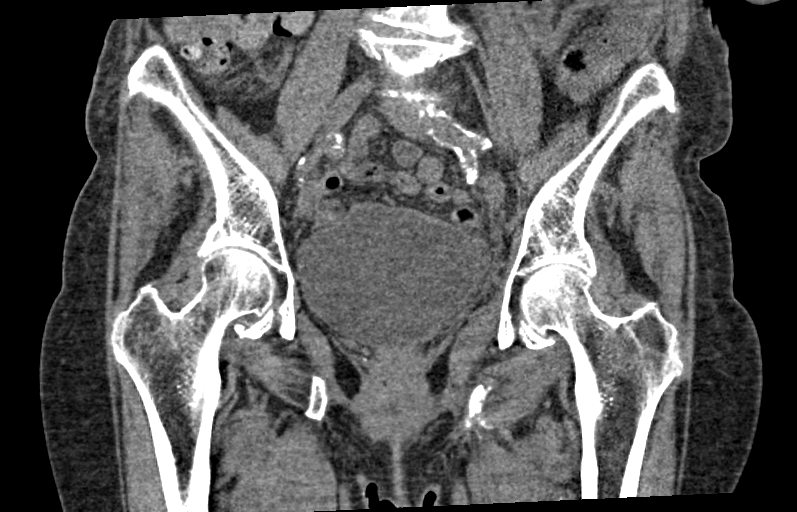
[im 58/105  soft-tissue]
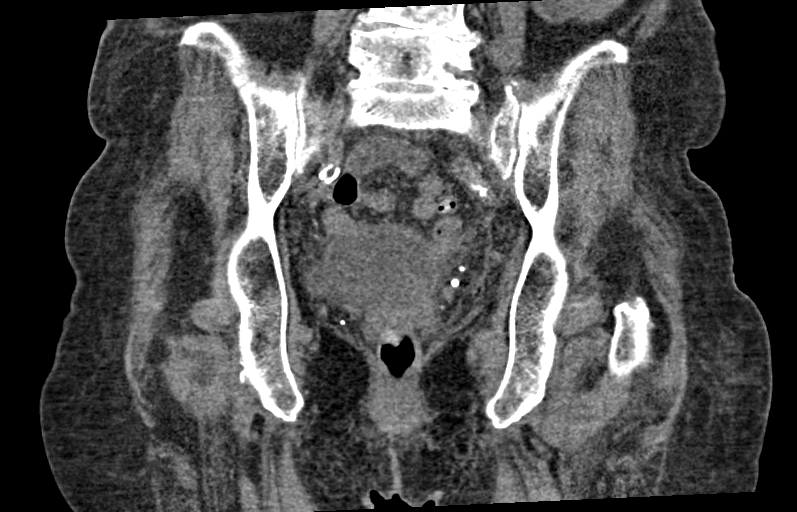

[14 of 46 positions shown; findings below may reference images not displayed]

Multidetector CT imaging of the pelvis was performed following the
standard protocol without intravenous contrast.

RADIATION DOSE REDUCTION: This exam was performed according to the
departmental dose-optimization program which includes automated
exposure control, adjustment of the mA and/or kV according to
patient size and/or use of iterative reconstruction technique.
FINDINGS: CT THORACIC SPINE FINDINGS

Alignment: Normal.

Vertebrae: No acute fracture or focal pathologic process.

Paraspinal and other soft tissues: Negative.

Disc levels: Mild loss of disc height throughout the thoracic spine
with small anterior endplate osteophytes. No significant disc
bulging. No convincing disc herniation. No significant stenosis.

CT LUMBAR SPINE FINDINGS

Segmentation: 5 lumbar type vertebra.

Alignment: Curvature, convex the left in the upper lumbar spine and
to the right in the lower lumbar spine. Slight, proximally 3 mm,
anterolisthesis of L5 on S1. No other spondylolisthesis.

Vertebrae: No acute fracture or focal pathologic process.

Paraspinal and other soft tissues: No spinal canal/epidural
mass/hemorrhage. Partly imaged abdominal aortic aneurysm, unchanged
from a CT dated 02/28/2022.

Disc levels: Mild loss of disc height at T12-L1. Moderate to marked
loss of disc height at L1-L2 and L2-L3. Mild loss of disc height at
L3-L4. Moderate to marked loss of disc height at L4-L5 with moderate
loss of disc height at L5-S1. Endplate osteophytes noted throughout
the visualized spine. Mild bilateral facet degenerative changes at
L2-L3 with moderate right and mild left facet degenerative changes
at L3-L4. Moderate left and mild right facet degenerative changes at
L4-L5 and L5-S1.

Mild degrees of disc bulging.  No evidence of a disc herniation.

Varying degrees of neural foraminal narrowing, mostly mild, moderate
on the left at L5-S1.

CT PELVIS FINDINGS

Osseous structures: Fractures of the left superior and inferior
pubic rami. Oblique fracture of the left superior pubic ramus,
adjacent to the pubic symphysis, mildly comminuted without
significant displacement. Minimally comminuted fracture of the
inferior pubic ramus, 3 mm of displacement.

Somewhat equivocal subtle nondisplaced fracture along the anterior
margin of the left sacrum adjacent to the SI joint.

No other fractures.  No bone lesions.

SI joints, hip joints and pubic symphysis are normally aligned.

Soft tissues: Mild thickening of the left obturators internus muscle
consistent with reactive edema to the adjacent fractures.
Peripherally calcified abdominal aortic aneurysm, without evidence
of rupture. No pelvic mass or free fluid.
IMPRESSION: CT THORACIC SPINE IMPRESSION

1. No fracture or acute finding.
2. Mild disc degenerative changes throughout the thoracic spine. No
bone lesion.

CT LUMBAR SPINE IMPRESSION

1. No fracture or acute finding.
2. Diffuse disc and facet degenerative changes. No evidence of a
disc herniation.
3. Scoliosis.

CT PELVIS IMPRESSION

1. Acute, mildly comminuted and nondisplaced fracture of the left
superior pubic ramus adjacent to the pubic symphysis.
2. Acute minimally comminuted, mildly displaced fracture of the
inferior left pubic ramus.
3. Somewhat equivocal subtle, nondisplaced fracture along the
anterior margin of the left sacrum adjacent to the SI joint.
4. No other fractures.

## 2022-09-17 DIAGNOSIS — I10 Essential (primary) hypertension: Secondary | ICD-10-CM | POA: Diagnosis not present

## 2022-09-17 DIAGNOSIS — G301 Alzheimer's disease with late onset: Secondary | ICD-10-CM | POA: Diagnosis not present

## 2022-09-30 DIAGNOSIS — G250 Essential tremor: Secondary | ICD-10-CM | POA: Diagnosis not present

## 2022-09-30 DIAGNOSIS — I10 Essential (primary) hypertension: Secondary | ICD-10-CM | POA: Diagnosis not present

## 2022-09-30 DIAGNOSIS — F01A4 Vascular dementia, mild, with anxiety: Secondary | ICD-10-CM | POA: Diagnosis not present

## 2022-09-30 DIAGNOSIS — Z66 Do not resuscitate: Secondary | ICD-10-CM | POA: Diagnosis not present

## 2022-10-10 ENCOUNTER — Encounter (HOSPITAL_COMMUNITY): Payer: Self-pay

## 2022-10-10 ENCOUNTER — Emergency Department (HOSPITAL_COMMUNITY): Payer: Medicare Other

## 2022-10-10 ENCOUNTER — Emergency Department (HOSPITAL_COMMUNITY)
Admission: EM | Admit: 2022-10-10 | Discharge: 2022-10-10 | Disposition: A | Discharge status: Skilled Nursing Facility | Payer: Medicare Other | Attending: Emergency Medicine | Admitting: Emergency Medicine

## 2022-10-10 DIAGNOSIS — N189 Chronic kidney disease, unspecified: Secondary | ICD-10-CM | POA: Insufficient documentation

## 2022-10-10 DIAGNOSIS — R251 Tremor, unspecified: Secondary | ICD-10-CM | POA: Diagnosis not present

## 2022-10-10 DIAGNOSIS — Z7401 Bed confinement status: Secondary | ICD-10-CM | POA: Diagnosis not present

## 2022-10-10 DIAGNOSIS — W19XXXA Unspecified fall, initial encounter: Secondary | ICD-10-CM | POA: Diagnosis not present

## 2022-10-10 DIAGNOSIS — F0390 Unspecified dementia without behavioral disturbance: Secondary | ICD-10-CM | POA: Diagnosis not present

## 2022-10-10 DIAGNOSIS — R6889 Other general symptoms and signs: Secondary | ICD-10-CM | POA: Diagnosis not present

## 2022-10-10 DIAGNOSIS — N3000 Acute cystitis without hematuria: Secondary | ICD-10-CM | POA: Insufficient documentation

## 2022-10-10 DIAGNOSIS — R9431 Abnormal electrocardiogram [ECG] [EKG]: Secondary | ICD-10-CM | POA: Diagnosis not present

## 2022-10-10 DIAGNOSIS — Z743 Need for continuous supervision: Secondary | ICD-10-CM | POA: Diagnosis not present

## 2022-10-10 DIAGNOSIS — Z85038 Personal history of other malignant neoplasm of large intestine: Secondary | ICD-10-CM | POA: Diagnosis not present

## 2022-10-10 LAB — CBC WITH DIFFERENTIAL/PLATELET
Abs Immature Granulocytes: 0.02 10*3/uL (ref 0.00–0.07)
Basophils Absolute: 0 10*3/uL (ref 0.0–0.1)
Basophils Relative: 0 %
Eosinophils Absolute: 0.1 10*3/uL (ref 0.0–0.5)
Eosinophils Relative: 1 %
HCT: 36.7 % (ref 36.0–46.0)
Hemoglobin: 12.1 g/dL (ref 12.0–15.0)
Immature Granulocytes: 0 %
Lymphocytes Relative: 12 %
Lymphs Abs: 0.9 10*3/uL (ref 0.7–4.0)
MCH: 30 pg (ref 26.0–34.0)
MCHC: 33 g/dL (ref 30.0–36.0)
MCV: 90.8 fL (ref 80.0–100.0)
Monocytes Absolute: 0.7 10*3/uL (ref 0.1–1.0)
Monocytes Relative: 9 %
Neutro Abs: 6.1 10*3/uL (ref 1.7–7.7)
Neutrophils Relative %: 78 %
Platelets: 155 10*3/uL (ref 150–400)
RBC: 4.04 MIL/uL (ref 3.87–5.11)
RDW: 13.5 % (ref 11.5–15.5)
WBC: 7.9 10*3/uL (ref 4.0–10.5)
nRBC: 0 % (ref 0.0–0.2)

## 2022-10-10 LAB — URINALYSIS, ROUTINE W REFLEX MICROSCOPIC
Bilirubin Urine: UNDETERMINED — AB
Bilirubin Urine: NEGATIVE
Glucose, UA: UNDETERMINED mg/dL — AB
Glucose, UA: NEGATIVE mg/dL
Hgb urine dipstick: UNDETERMINED — AB
Ketones, ur: UNDETERMINED mg/dL — AB
Ketones, ur: NEGATIVE mg/dL
Leukocytes,Ua: UNDETERMINED — AB
Nitrite: UNDETERMINED — AB
Nitrite: POSITIVE — AB
Protein, ur: UNDETERMINED mg/dL — AB
Protein, ur: NEGATIVE mg/dL
Specific Gravity, Urine: UNDETERMINED (ref 1.005–1.030)
Specific Gravity, Urine: 1.01 (ref 1.005–1.030)
WBC, UA: >50 WBC/hpf — ABNORMAL HIGH (ref 0–5)
pH: UNDETERMINED (ref 5.0–8.0)
pH: 7 (ref 5.0–8.0)

## 2022-10-10 LAB — COMPREHENSIVE METABOLIC PANEL
ALT: 10 U/L (ref 0–44)
AST: 14 U/L — ABNORMAL LOW (ref 15–41)
Albumin: 3.3 g/dL — ABNORMAL LOW (ref 3.5–5.0)
Alkaline Phosphatase: 51 U/L (ref 38–126)
Anion gap: 9 (ref 5–15)
BUN: 25 mg/dL — ABNORMAL HIGH (ref 8–23)
CO2: 28 mmol/L (ref 22–32)
Calcium: 9.1 mg/dL (ref 8.9–10.3)
Chloride: 102 mmol/L (ref 98–111)
Creatinine, Ser: 1.21 mg/dL — ABNORMAL HIGH (ref 0.44–1.00)
GFR, Estimated: 43 mL/min — ABNORMAL LOW (ref >60)
Glucose, Bld: 100 mg/dL — ABNORMAL HIGH (ref 70–99)
Potassium: 4.1 mmol/L (ref 3.5–5.1)
Sodium: 139 mmol/L (ref 135–145)
Total Bilirubin: 0.6 mg/dL (ref 0.3–1.2)
Total Protein: 6.7 g/dL (ref 6.5–8.1)

## 2022-10-10 LAB — URINALYSIS, MICROSCOPIC (REFLEX): WBC, UA: >50 WBC/hpf (ref 0–5)

## 2022-10-10 MED ORDER — NITROFURANTOIN MONOHYD MACRO 100 MG PO CAPS: 100.0000 mg | ORAL_CAPSULE | Freq: Two times a day (BID) | ORAL | 0 refills | Status: AC

## 2022-10-10 MED ORDER — METOPROLOL TARTRATE 25 MG PO TABS
25.0000 mg | ORAL_TABLET | Freq: Once | ORAL | Status: AC
  Administered 2022-10-10: 25 mg via ORAL
  Filled 2022-10-10: qty 1

## 2022-10-10 NOTE — ED Notes (Signed)
[facility] called at 15:51; patient is 8th in line.

## 2022-10-10 NOTE — ED Provider Notes (Signed)
[facility] EMERGENCY DEPARTMENT Provider Note   CSN: [id number] Arrival date & time: 10/10/22  5284     History  Chief Complaint  Patient presents with   Tremors    [name] is a 88 y.o. female with a past medical history of dementia, carotid artery stenosis, colon cancer, CKD brought in by [facility] from [facility] to the emergency department for evaluation of tremors.  Per EMS, patient reportedly has increased tremor in the last few days. Patient is alert and confused at baseline.  Per report from [place] at [facility] patient started to have tremors 3 weeks ago.  Her doctor thought that was because of Depakote so they got her off of the Depakote.  Patient has been doing fine until 3 days ago when she started to have tremors again.  This morning it got worse as patient had increased tremor, she could not feed or dress herself.  She looked pale.  Patient almost fell out of the chair because of shaking.  She is taking hydrocodone for back pain, furosemide, lorazepam last dose 2 weeks ago, metoprolol trazodone at night.  Eating and drinking okay.  Patient is DNR.   HPI     Home Medications Prior to Admission medications   Medication Sig Start Date End Date Taking? Authorizing Provider  acetaminophen (TYLENOL) 325 MG tablet Take 2 tablets (650 mg total) by mouth every 6 (six) hours as needed for mild pain or headache. Patient not taking: Reported on 03/03/2022 01/02/21   Domenic Polite, MD  acetaminophen (TYLENOL) 500 MG tablet Take 500 mg by mouth every 6 (six) hours as needed (FOR PAIN).    [provider]  amLODipine (NORVASC) 10 MG tablet Take 1 tablet (10 mg total) by mouth daily. Patient not taking: Reported on 03/03/2022 01/03/21   Domenic Polite, MD  cephALEXin (KEFLEX) 500 MG capsule Take 1 capsule (500 mg total) by mouth 3 (three) times daily. 03/07/22   Varney Biles, MD  Cholecalciferol (VITAMIN D-3 PO) Take 1 capsule by mouth  daily with breakfast.    [provider]  Cyanocobalamin (VITAMIN B-12 PO) Take 1 tablet by mouth daily with breakfast.    [provider]  famotidine (PEPCID AC) 10 MG tablet Take 10 mg by mouth daily.    [provider]  fluticasone (FLONASE) 50 MCG/ACT nasal spray Place 1-2 sprays into both nostrils 2 (two) times daily as needed for allergies or rhinitis.    [provider]  HYDROcodone-acetaminophen (NORCO/VICODIN) 5-325 MG tablet Take 1 tablet by mouth daily as needed for pain. 10/09/21   [provider]  ibuprofen (ADVIL) 200 MG tablet Take 400 mg by mouth every 6 (six) hours as needed (FOR PAIN).    [provider]  lidocaine (LIDODERM) 5 % Place 1 patch onto the skin daily. Remove & Discard patch within 12 hours or as directed by MD Patient not taking: Reported on 03/03/2022 02/28/22   Horton, Alvin Critchley, DO  Lidocaine 4 % PTCH Place 1 patch onto the skin 2 (two) times daily as needed (for upper back pain- remove as directed).    [provider]  methocarbamol (ROBAXIN) 500 MG tablet Take 1 tablet (500 mg total) by mouth every 8 (eight) hours as needed for muscle spasms. Patient not taking: Reported on 03/03/2022 01/02/21   Domenic Polite, MD  metoprolol tartrate (LOPRESSOR) 25 MG tablet Take 0.5 tablets (12.5 mg total) by mouth 2 (two) times daily. Patient  taking differently: Take 25 mg by mouth in the morning. 01/02/21   Domenic Polite, MD  Nutritional Supplements (FEEDING SUPPLEMENT, OSMOLITE 1.2 CAL,) LIQD Place 720 mLs into feeding tube daily. Run this for 10hours at night only Patient not taking: Reported on 03/03/2022 01/02/21   Domenic Polite, MD  Omega-3 Fatty Acids (OMEGA 3 PO) Take 1 capsule by mouth daily.    [provider]  ondansetron (ZOFRAN ODT) 4 MG disintegrating tablet Take 1 tablet (4 mg total) by mouth every 8 (eight) hours as needed for nausea or vomiting. Patient not taking: Reported on 03/03/2022  01/02/21   Domenic Polite, MD  pantoprazole (PROTONIX) 40 MG tablet Take 1 tablet (40 mg total) by mouth daily at 12 noon. Patient not taking: Reported on 03/03/2022 01/02/21   Domenic Polite, MD  Polyethyl Glycol-Propyl Glycol 0.4-0.3 % SOLN Place 1 drop into both eyes 2 (two) times daily as needed (dry eyes).     [provider]      Allergies    Nsaids    Review of Systems   Review of Systems  Physical Exam Updated Vital Signs BP (!) 188/77 (BP Location: Right Arm)   Pulse (!) 59   Temp 97.9 F (36.6 C) (Oral)   Resp 16   SpO2 98%  Physical Exam Vitals and nursing note reviewed.  Constitutional:      Appearance: Normal appearance.  HENT:     Head: Normocephalic and atraumatic.     Mouth/Throat:     Mouth: Mucous membranes are moist.  Eyes:     General: No scleral icterus. Cardiovascular:     Rate and Rhythm: Normal rate and regular rhythm.     Pulses: Normal pulses.     Heart sounds: Normal heart sounds.  Pulmonary:     Effort: Pulmonary effort is normal.     Breath sounds: Normal breath sounds.  Abdominal:     General: Abdomen is flat.     Palpations: Abdomen is soft.     Tenderness: There is no abdominal tenderness.  Musculoskeletal:        General: No deformity.  Skin:    General: Skin is warm.     Findings: No rash.  Neurological:     General: No focal deficit present.     Mental Status: She is alert.     Comments: Patient is ANO x2.  She is demented and confused at baseline.  No resting tremor noticed.  Mild tremor with hand movement.  Psychiatric:        Mood and Affect: Mood normal.     ED Results / Procedures / Treatments   Labs (all labs ordered are listed, but only abnormal results are displayed) Labs Reviewed - No data to display  EKG EKG Interpretation  Date/Time:  Thursday October 10 2022 10:25:09 EDT Ventricular Rate:  56 PR Interval:  181 QRS Duration: 74 QT Interval:  416 QTC Calculation: 402 R Axis:   72 Text  Interpretation: Sinus rhythm Borderline ST depression, diffuse leads Confirmed by Campbell Stall ([id number]) on 09/13/2693 12:12:41 PM  Radiology CT Head Wo Contrast  Result Date: 10/10/2022 CLINICAL DATA:  Tremor. EXAM: CT HEAD WITHOUT CONTRAST TECHNIQUE: Contiguous axial images were obtained from the base of the skull through the vertex without intravenous contrast. RADIATION DOSE REDUCTION: This exam was performed according to the departmental dose-optimization program which includes automated exposure control, adjustment of the mA and/or kV according to patient size and/or use of iterative reconstruction technique. COMPARISON:  03/03/2022 FINDINGS: Brain: No evidence of acute infarction, hemorrhage, hydrocephalus, extra-axial collection or mass lesion/mass effect. There is mild diffuse low-attenuation within the subcortical and periventricular white matter compatible with chronic microvascular disease. Vascular: No hyperdense vessel or unexpected calcification. Skull: Normal. Negative for fracture or focal lesion. Sinuses/Orbits: No acute finding. Other: None IMPRESSION: 1. No acute intracranial abnormalities. 2. Chronic microvascular disease Electronically Signed   By: Kerby Moors M.D.   On: 10/10/2022 11:29    Procedures Procedures    Medications Ordered in ED Medications - No data to display  ED Course/ Medical Decision Making/ A&P Clinical Course as of 10/11/22 0832  Thu Oct 10, 2022  1459 Essential tremor, UTI  [DG]    Clinical Course User Index [DG] Renard Matter, MD                           Medical Decision Making Amount and/or Complexity of Data Reviewed Labs: ordered. Radiology: ordered.  Risk Prescription drug management.   This patient presents to the ED for concern of tremors, this involves an extensive number of treatment options, and is a complaint that carries with a high risk of complications and morbidity.  The differential diagnosis includes ICH, CVA,  polypharmacy, electrolyte abnormality, parkinsonism, essential tremors.  This is not an exhaustive list.  Comorbidities that complicate the patient evaluation See HPI  Social determinants of health NA  Additional history obtained: Additional history obtained from EMR. External records from outside source obtained and review including prior labs  Cardiac monitoring/EKG: The patient was maintained on a cardiac monitor.  I personally reviewed and interpreted the cardiac monitor which showed an underlying rhythm of: Sinus rhythm.  Lab tests: I ordered and personally interpreted labs.  The pertinent results include WBC normal. Hbg/Hct normal. Platelets normal. No electrolyte abnormalities noted. BUN, creatinine normal. No transaminitis. UA is positive for nitrite and leukocytes.  Imaging studies: I ordered imaging studies including CT head negative. I personally reviewed, interpreted imaging and agree with the radiologist's interpretations.  Problem list/ ED course/ Critical interventions/ Medical management: HPI: See above Vital signs significant for BP 177/68, heart rate 52.  Otherwise within normal range and stable throughout visit. Laboratory/imaging studies significant for: See above. On physical examination, patient is afebrile and appears in no acute distress.  Patient is alert and oriented x2.  She is demented and confused at baseline.  No resting tremor noticed.  She does have mild tremor with hand movement.  CBC, CMP unremarkable.  CT head with no acute abnormality.  EKG without any ischemic changes.  Pending UA.. 12:17 PM Reassessed patient.  She is resting comfortably in bed.  She was able to follow my commands appropriately.  No resting tremor.  There was very minimal tremor with hand movement which improved compared to when she first arrived in the ED. UA is positive for nitrite and leukocytes.  I sent in an Rx nitrofurantoin for treatment of UTI. Patient's clinical  presentations and laboratory/imaging studies are most concerned for essential tremors and UTI.  Low suspicions for CVA or ICH as CT scan head was negative. Advised patient to follow-up with outpatient neurology for evaluation of tremor.. I have reviewed the patient home medicines and have made adjustments as needed.  Consultations obtained: I requested consultation with the attending Dr.Gray, and discussed lab and imaging findings as well as pertinent plan.  They recommend UTI treatment and outpatient neurology follow-up.  Disposition Continued outpatient therapy. Follow-up with  PCP and neurology recommended for reevaluation of symptoms. Treatment plan discussed with patient.  Pt acknowledged understanding was agreeable to the plan. Worrisome signs and symptoms were discussed and return protocol was mentioned in the discharge paper. Patient was stable upon discharge.   This chart was dictated using voice recognition software.  Despite best efforts to proofread,  errors can occur which can change the documentation meaning.          Final Clinical Impression(s) / ED Diagnoses Final diagnoses:  Tremor  Acute cystitis without hematuria    Rx / DC Orders ED Discharge Orders          Ordered    nitrofurantoin, macrocrystal-monohydrate, (MACROBID) 100 MG capsule  2 times daily        10/10/22 1517              Rex Kras, PA 57/01/77 9390    Lianne Cure, DO 30/09/23 [phone]

## 2022-10-10 NOTE — ED Triage Notes (Signed)
Pt arrived to the ED via EMS with complaints of tremors. Patient comes from [facility] in which the staff there told EMS that the patient's tremors are worse than normal. Patient is alert and confused at baseline. Patient is a DNR.

## 2022-10-10 NOTE — ED Notes (Signed)
Attempted to call report, no answer, HIPAA compliant voicemail left.

## 2022-10-10 NOTE — Discharge Instructions (Addendum)
Please take your antibiotics as prescribed. Follow up outpatient neurology. I recommend close follow-up with PCP for reevaluation.  Please do not hesitate to return to emergency department if worrisome signs symptoms we discussed become apparent.

## 2022-10-14 ENCOUNTER — Encounter: Payer: Self-pay | Admitting: Neurology

## 2022-10-14 DIAGNOSIS — N390 Urinary tract infection, site not specified: Secondary | ICD-10-CM | POA: Diagnosis not present

## 2022-10-14 DIAGNOSIS — G252 Other specified forms of tremor: Secondary | ICD-10-CM | POA: Diagnosis not present

## 2022-10-14 DIAGNOSIS — F01A4 Vascular dementia, mild, with anxiety: Secondary | ICD-10-CM | POA: Diagnosis not present

## 2022-10-14 DIAGNOSIS — I10 Essential (primary) hypertension: Secondary | ICD-10-CM | POA: Diagnosis not present

## 2022-10-14 DIAGNOSIS — G218 Other secondary parkinsonism: Secondary | ICD-10-CM | POA: Diagnosis not present

## 2022-10-22 ENCOUNTER — Telehealth: Payer: Self-pay

## 2022-10-22 NOTE — Telephone Encounter (Signed)
        Patient  visited [facility] on 10/10/2022  for Tremor, unspecified.   Telephone encounter attempt :  1st  Unable to leave message voicemail is full.   [place] Resource Care Guide   [email or web address]  ?? [phone]   Website: [email or web address]  [email or web address]

## 2022-10-23 ENCOUNTER — Telehealth: Payer: Self-pay

## 2022-10-23 NOTE — Telephone Encounter (Signed)
     Patient  visit on 10/10/2022  at [facility] [place] was for Tremor, unspecified.  Have you been able to follow up with your primary care physician?  Patient has 11/25/22 appointment at [facility].  The patient was or was not able to obtain any needed medicine or equipment. Medication was obtained. Patient returned back to [facility] after discharge.  Are there diet recommendations that you are having difficulty following? No  Patient expresses understanding of discharge instructions and education provided has no other needs at this time.    [place] Resource Care Guide   ??[email or web address]  ?? [phone]   Website: [email or web address]  [email or web address]

## 2022-10-30 ENCOUNTER — Telehealth: Payer: Self-pay | Admitting: *Deleted

## 2022-10-30 NOTE — Patient Outreach (Signed)
  Care Coordination   10/30/2022 Name: [name] MRN: [id number] DOB: 01-19-1932   Care Coordination Outreach Attempts:  An unsuccessful telephone outreach was attempted today to offer the patient information about available care coordination services as a benefit of their health plan.   Follow Up Plan:  Additional outreach attempts will be made to offer the patient care coordination information and services.   Encounter Outcome:  No Answer  Care Coordination Interventions Activated:  No   Care Coordination Interventions:  No, not indicated    Raina Mina, RN Care Management Coordinator [place] Office [phone]

## 2022-11-21 NOTE — Progress Notes (Signed)
Assessment/Plan:   1.  Myoclonus, possibly cortical/spinal -  fam hx of similar in daughter (who is now deceased) -discussed that myoclonus is just a sx and can come from multiple places - medications (doubt), kidney failure (her kidney function is actually better than it was 8 months ago), cortical/spinal (mri brain difficult in her because of significant myoclonus), paraneoplastic and neurodegenerative (CBGD).  Discussed that first step is DaT scan.  They are going to discuss as family and let me know if they want to proceed. -if goal is to make her comfortable (she is on hospice), then scheduled ativan or preferably klonopin (given longer t1/2) would be worth a trial, but discussed with them that this could make her sleepy.  They will discuss as family   Subjective:   [name] was seen in consultation in the movement disorder clinic at the request of Lianne Cure, DO (ER physician).  Daughter and husband supplement hx.  The evaluation is for tremor.  Medical records made available to me are reviewed.  Patient was seen October 10, 2022 in the emergency room.  Patient has baseline dementia.  Patient was sent to the emergency room for increased tremor for the prior 3 weeks.  Records indicate that physician thought it was from Depakote, so they took her off of it, but then tremors began to increase.  Emergency room records indicate that "she does have mild tremor with hand movement."  They report that there was no resting tremor.  They also reported that patient's tremor was "very minimal."  They reported that patient had a urinary tract infection.  She was treated and discharged from the emergency room and sent here for further evaluation.  CT brain was done in the emergency room demonstrated chronic small vessel disease.  There were basal ganglia calcifications.  It was nonacute.  I personally reviewed it.    Pts family states that she does not tremor but she has jerking spells.  It can  involve the entire body.  If holding something, she will drop it or spill it.  This started 3-4 months ago.  She went to [place] in May, 2023 and new medications were started at that time.  That included the risperdal and the VPA.   She is on carbidopa/levodopa 10/100 tid x 4 months.    She has a dx of dementia.  She has a hx of falls - 3 in the last 6 months.  She ambulates with short, shuffling steps x 2 months.  ambulation was better before she went to snf.  She has a weak voice.  She knows her family.  No hallucinations.  There is confusion.  There is some agitation, esp if someone is helping her in the shower.  Son in law states that there has been a dramatic change in last 6 months - lost at least 30 lbs.  No choking with food.  More confusion.  Daughter states that patients other daughter had something similar (not the jerking but the falls) and was told by Dr. Erling Cruz years ago that she had something like Parkinsons Disease but it wasn't Parkinsons Disease.  She is now deceased.    Current medications that may exacerbate tremor:  risperdal, 0.5 mg q hs; VPA (recently d/c)    Allergies  Allergen Reactions   Nsaids Other (See Comments)    Hx of perforated duodenal ulcer     Current Meds  Medication Sig   acetaminophen (TYLENOL) 500 MG tablet Take 500  mg by mouth every 6 (six) hours as needed (FOR PAIN).   carbidopa-levodopa (SINEMET IR) 10-100 MG tablet Take by mouth.   Cholecalciferol (VITAMIN D-3) 25 MCG (1000 UT) CAPS Take 1,000 Units by mouth daily.   cyanocobalamin (VITAMIN B12) 1000 MCG tablet Take 1,000 mcg by mouth daily.   escitalopram (LEXAPRO) 10 MG tablet Take 10 mg by mouth daily.   famotidine (PEPCID AC) 10 MG tablet Take 10 mg by mouth daily.   furosemide (LASIX) 20 MG tablet Take 20 mg by mouth daily.   HYDROcodone-acetaminophen (NORCO/VICODIN) 5-325 MG tablet Take 1 tablet by mouth daily as needed for pain.   loperamide (IMODIUM) 2 MG capsule Take 2 mg by mouth as  needed for diarrhea or loose stools.   LORazepam (ATIVAN) 0.5 MG tablet Take 0.5 mg by mouth 2 (two) times daily as needed.   metoprolol succinate (TOPROL-XL) 25 MG 24 hr tablet Take 25 mg by mouth daily.   risperiDONE (RISPERDAL) 0.5 MG tablet Take 0.5 mg by mouth at bedtime.   traZODone (DESYREL) 50 MG tablet Take 50 mg by mouth at bedtime.      Objective:   VITALS:   Vitals:   11/25/22 0925  BP: 116/62  Pulse: 62  SpO2: 99%  Weight: 110 lb (49.9 kg)  Height: '5\' 8"'$  (1.727 m)   Gen:  Appears stated age and in NAD. HEENT:  Normocephalic, atraumatic. The mucous membranes are moist. The superficial temporal arteries are without ropiness or tenderness. Cardiovascular: Regular rate and rhythm. Lungs: Clear to auscultation bilaterally. Neck: There are no carotid bruits noted bilaterally.  NEUROLOGICAL:  Orientation:  The patient is alert and follows just a few simple commands.  She is apraxic.   Cranial nerves: There is good facial symmetry. Extraocular muscles are intact.  There are square wave jerks.  She speaks little. Speech is hypophonic and dysarthric. Soft palate rises symmetrically and there is no tongue deviation. Hearing is intact to conversational tone. Tone: difficult to assess as she doesn't relax.  Appears ok in the LE Sensation: unable to participate Coordination:  The patient only participates with hand opening/closing.  It is good b/l Motor: Strength is at least antigravity x 4.   DTR's: Deep tendon reflexes are 2-/4 at the bilateral biceps, triceps, brachioradialis, patella and achilles.  Plantar responses are downgoing bilaterally. Gait and Station: The patient needs help to arise and the myoclonus requires someone to hold her up or she would fall  MOVEMENT EXAM: Abnormal movements:  pt with significant myoclonus, diffuse, R>L.    I have reviewed and interpreted the following labs independently   Chemistry      Component Value Date/Time   NA 139 10/10/2022  1040   K 4.1 10/10/2022 1040   CL 102 10/10/2022 1040   CO2 28 10/10/2022 1040   BUN 25 (H) 10/10/2022 1040   CREATININE 1.21 (H) 10/10/2022 1040      Component Value Date/Time   CALCIUM 9.1 10/10/2022 1040   ALKPHOS 51 10/10/2022 1040   AST 14 (L) 10/10/2022 1040   ALT 10 10/10/2022 1040   BILITOT 0.6 10/10/2022 1040      Lab Results  Component Value Date   WBC 7.9 10/10/2022   HGB 12.1 10/10/2022   HCT 36.7 10/10/2022   MCV 90.8 10/10/2022   PLT 155 10/10/2022   No results found for: "TSH"    Total time spent on today's visit was 45 minutes, including both face-to-face time and nonface-to-face time.  Time  included that spent on review of records (prior notes available to me/labs/imaging if pertinent), discussing treatment and goals, answering patient's questions and coordinating care.  CC:  Jonathon Jordan, MD

## 2022-11-25 ENCOUNTER — Encounter: Payer: Self-pay | Admitting: Neurology

## 2022-11-25 ENCOUNTER — Ambulatory Visit (INDEPENDENT_AMBULATORY_CARE_PROVIDER_SITE_OTHER): Admitting: Neurology

## 2022-11-25 VITALS — BP 116/62 | HR 62 | Ht 68.0 in | Wt 110.0 lb

## 2022-11-25 DIAGNOSIS — G253 Myoclonus: Secondary | ICD-10-CM

## 2022-11-25 NOTE — Patient Instructions (Addendum)
We discussed that this movement (myoclonus) can come from:  -medications (I don't see this in you)  -kidney failure (you have some kidney disease but its actually better now than 8 months ago)  -brain or spinal cord problems  -neurodegenerative diseases  -paraneoplastic syndromes/cancers.  The first step in looking in this in you would be to look at a DaT scan, since MRI would be difficult in you. We discussed that this is not a diagnostic scan, but will just give [place] some information on dopamine levels in the brain.  Here is some information which may be helpful to you.  Before the Exam  Please tell the nurse, nuclear imaging technician or nuclear medicine physician if you are pregnant, nursing or have reduced liver function. Please also inform us if you have an allergy or sensitivity to iodine.  The test may be completed with those who are allergic to iodine, but may require pre-medication with other medications to help avoid reactions. If you need to cancel the examination, please give [place] at least 24 hours notice.  Before your scan, stop taking these medicines for the length of time shown: Name of Drug Stop Taking  Amoxapine 4 days before  Benztropine  Cogentin 3 days before  Bupropion (Aplenzin, Budeprion, Voxra, Wellbutrin, Zyban) 48 hours before  Buspirone 15 hours before  Citalopram 24 hours before  Cocaine 6 hours before  Escitalopram 24 hours before  Methamphetamine 24 hours before  Methylphenidate (Concerta, Metadate, Methylin, Ritalin) 20 hours before  Paroxetine 24 hours before  Selegilene 48 hours before  Sertraline 3 days before    On the Day of the Exam Drink plenty of fluids and go to the bathroom frequently (and for two days after your exam) Wear loose comfortable clothing, since you will need to lie still for a period of time. Please bring a list of all medications that you are taking; name and dosage. We want to make your waiting time as pleasant as possible.  Consider bringing your favorite magazine, book or music player to help you pass the time.  You do not need to stay at the imaging facility the entire time, between the initial injection and the scan itself.   Please leave your jewelry and valuables at home.  During the Exam The DaTscan once started takes approximately 30-45 minutes. However, following injection of the DaT agent approximately 3-6 hours are required before the agent has achieved appropriate concentration in the brain.  We will inject the DaTscan through an intravenous (IV) line into your arm in the AM, usually around 8-9am, and then you will come back usually in the mid afternoon for the scan. Before the exam, you will receive a drug to allow you to protect the thyroid. For the imaging test, you will be asked to lie on a table and an imaging technologist will position your head in a headrest. A strip of tape or a flexible restraint may be placed around your head to help you to not move your head during the scan. A camera will be positioned above you and you must remain very still for about 30 minute while images are taken. The scanner will be very close to your head, but will not touch your head.  We discussed that we don't have to do ANY of this and we can trial scheduled clonazepam (or schedule the ativan that you are on, as opposed to just using it as needed).  Let us know how you would like to proceed

## 2022-11-26 ENCOUNTER — Telehealth: Payer: Self-pay | Admitting: Neurology

## 2022-11-26 NOTE — Telephone Encounter (Signed)
Called and spoke to daughter and she meant Klonopin not Ativan they need a hard copy prescription for the facility that she can come by and pick up for this patient

## 2022-11-26 NOTE — Telephone Encounter (Signed)
Pt's daughter called in and left a message with the access nurse. "Caller states her mother was in yesterday about a vestibular problem. She went back to the facility and the facility called her saying a prescription was called in for Ativan and they wanted to start her on it. They could do 2 things, start the med or do a brain scan. They don't want to do the brain scan. They would like to start the med at a low dose."

## 2022-11-26 NOTE — Telephone Encounter (Signed)
Called and spoke to Anna at [place] and she will have prescribing provider call our office Stormy Fabian NP

## 2022-11-26 NOTE — Telephone Encounter (Signed)
She is getting ativan.  Last given 9/26 but it is prn use I think She is getting vicodin, 5 mg bid.   Because of the [place] STOP act (opioid act), the same provider has to provide the benzo and opioid.  That is what is happening right now but if they want to change to clonazepam, the other doc will have to do it.  I would recommend trying very low dose, 0.5 mg, 1/2 tablet twice per day.

## 2022-12-12 ENCOUNTER — Telehealth: Payer: Self-pay | Admitting: Anesthesiology

## 2022-12-12 NOTE — Telephone Encounter (Signed)
Pt's daughter [name] called stating she has questions regarding her Dr Doristine Devoid recommendations for pt. Stated she has questions regarding pt's medication changes and also about the brain scan Dr Tat suggested. Call back number [phone].

## 2022-12-13 NOTE — Telephone Encounter (Signed)
Spoke to patients daughter about my conversation with Jenny Reichmann the nurse with Stormy Fabian NP and told her the recommendations

## 2023-04-09 DEATH — deceased
